# Patient Record
Sex: Female | Born: 1940 | Race: White | Hispanic: No | Marital: Married | Smoking: Former smoker
Health system: Southern US, Community
[De-identification: ages and names within clinical notes are randomized; demographics above are authoritative.]

## PROBLEM LIST (undated history)

## (undated) DIAGNOSIS — M8580 Other specified disorders of bone density and structure, unspecified site: Secondary | ICD-10-CM

## (undated) DIAGNOSIS — Z8669 Personal history of other diseases of the nervous system and sense organs: Secondary | ICD-10-CM

## (undated) DIAGNOSIS — J302 Other seasonal allergic rhinitis: Secondary | ICD-10-CM

## (undated) DIAGNOSIS — M1990 Unspecified osteoarthritis, unspecified site: Secondary | ICD-10-CM

## (undated) DIAGNOSIS — I10 Essential (primary) hypertension: Secondary | ICD-10-CM

## (undated) DIAGNOSIS — E669 Obesity, unspecified: Secondary | ICD-10-CM

## (undated) DIAGNOSIS — R531 Weakness: Secondary | ICD-10-CM

## (undated) DIAGNOSIS — R202 Paresthesia of skin: Secondary | ICD-10-CM

## (undated) DIAGNOSIS — G5600 Carpal tunnel syndrome, unspecified upper limb: Secondary | ICD-10-CM

## (undated) DIAGNOSIS — K5790 Diverticulosis of intestine, part unspecified, without perforation or abscess without bleeding: Secondary | ICD-10-CM

## (undated) DIAGNOSIS — Z9109 Other allergy status, other than to drugs and biological substances: Secondary | ICD-10-CM

## (undated) DIAGNOSIS — K219 Gastro-esophageal reflux disease without esophagitis: Secondary | ICD-10-CM

## (undated) DIAGNOSIS — R269 Unspecified abnormalities of gait and mobility: Secondary | ICD-10-CM

## (undated) DIAGNOSIS — R4781 Slurred speech: Secondary | ICD-10-CM

## (undated) DIAGNOSIS — R200 Anesthesia of skin: Secondary | ICD-10-CM

## (undated) DIAGNOSIS — G4700 Insomnia, unspecified: Secondary | ICD-10-CM

## (undated) DIAGNOSIS — I6350 Cerebral infarction due to unspecified occlusion or stenosis of unspecified cerebral artery: Secondary | ICD-10-CM

## (undated) DIAGNOSIS — R0602 Shortness of breath: Secondary | ICD-10-CM

## (undated) DIAGNOSIS — I639 Cerebral infarction, unspecified: Secondary | ICD-10-CM

## (undated) DIAGNOSIS — I739 Peripheral vascular disease, unspecified: Secondary | ICD-10-CM

## (undated) HISTORY — DX: Other specified disorders of bone density and structure, unspecified site: M85.80

## (undated) HISTORY — DX: Cerebral infarction, unspecified: I63.9

## (undated) HISTORY — DX: Gastro-esophageal reflux disease without esophagitis: K21.9

## (undated) HISTORY — DX: Anesthesia of skin: R20.0

## (undated) HISTORY — DX: Shortness of breath: R06.02

## (undated) HISTORY — DX: Cerebral infarction due to unspecified occlusion or stenosis of unspecified cerebral artery: I63.50

## (undated) HISTORY — DX: Carpal tunnel syndrome, unspecified upper limb: G56.00

## (undated) HISTORY — DX: Paresthesia of skin: R20.2

## (undated) HISTORY — DX: Personal history of other diseases of the nervous system and sense organs: Z86.69

## (undated) HISTORY — DX: Unspecified osteoarthritis, unspecified site: M19.90

## (undated) HISTORY — DX: Essential (primary) hypertension: I10

## (undated) HISTORY — PX: ABDOMINAL HYSTERECTOMY: SHX81

## (undated) HISTORY — DX: Obesity, unspecified: E66.9

## (undated) HISTORY — DX: Diverticulosis of intestine, part unspecified, without perforation or abscess without bleeding: K57.90

## (undated) HISTORY — DX: Insomnia, unspecified: G47.00

## (undated) HISTORY — DX: Other seasonal allergic rhinitis: J30.2

## (undated) HISTORY — DX: Other allergy status, other than to drugs and biological substances: Z91.09

## (undated) HISTORY — DX: Slurred speech: R47.81

## (undated) HISTORY — DX: Peripheral vascular disease, unspecified: I73.9

## (undated) HISTORY — DX: Unspecified abnormalities of gait and mobility: R26.9

## (undated) HISTORY — PX: EYE SURGERY: SHX253

---

## 1998-04-26 ENCOUNTER — Encounter: Payer: Self-pay | Admitting: Otolaryngology

## 1998-04-26 ENCOUNTER — Ambulatory Visit (HOSPITAL_COMMUNITY): Admission: RE | Admit: 1998-04-26 | Discharge: 1998-04-26 | Payer: Self-pay | Admitting: Otolaryngology

## 1999-12-08 ENCOUNTER — Ambulatory Visit (HOSPITAL_COMMUNITY): Admission: RE | Admit: 1999-12-08 | Discharge: 1999-12-08 | Payer: Self-pay | Admitting: Family Medicine

## 1999-12-08 ENCOUNTER — Encounter: Payer: Self-pay | Admitting: Family Medicine

## 2000-02-09 ENCOUNTER — Ambulatory Visit: Admission: RE | Admit: 2000-02-09 | Discharge: 2000-02-09 | Payer: Self-pay | Admitting: *Deleted

## 2000-09-07 ENCOUNTER — Emergency Department (HOSPITAL_COMMUNITY): Admission: EM | Admit: 2000-09-07 | Discharge: 2000-09-07 | Payer: Self-pay | Admitting: Emergency Medicine

## 2000-09-07 ENCOUNTER — Encounter: Payer: Self-pay | Admitting: Emergency Medicine

## 2003-01-21 ENCOUNTER — Ambulatory Visit (HOSPITAL_COMMUNITY): Admission: RE | Admit: 2003-01-21 | Discharge: 2003-01-21 | Payer: Self-pay | Admitting: Gastroenterology

## 2003-11-05 ENCOUNTER — Encounter: Admission: RE | Admit: 2003-11-05 | Discharge: 2003-11-20 | Payer: Self-pay | Admitting: Family Medicine

## 2004-04-05 ENCOUNTER — Ambulatory Visit (HOSPITAL_COMMUNITY): Admission: RE | Admit: 2004-04-05 | Discharge: 2004-04-05 | Payer: Self-pay | Admitting: Family Medicine

## 2005-02-28 ENCOUNTER — Encounter: Admission: RE | Admit: 2005-02-28 | Discharge: 2005-02-28 | Payer: Self-pay | Admitting: Family Medicine

## 2005-11-09 ENCOUNTER — Other Ambulatory Visit: Admission: RE | Admit: 2005-11-09 | Discharge: 2005-11-09 | Payer: Self-pay | Admitting: Obstetrics and Gynecology

## 2005-11-13 ENCOUNTER — Ambulatory Visit (HOSPITAL_COMMUNITY): Admission: RE | Admit: 2005-11-13 | Discharge: 2005-11-13 | Payer: Self-pay | Admitting: Obstetrics and Gynecology

## 2007-01-04 ENCOUNTER — Ambulatory Visit (HOSPITAL_COMMUNITY): Admission: RE | Admit: 2007-01-04 | Discharge: 2007-01-04 | Payer: Self-pay | Admitting: Obstetrics and Gynecology

## 2007-05-07 ENCOUNTER — Encounter: Admission: RE | Admit: 2007-05-07 | Discharge: 2007-05-07 | Payer: Self-pay | Admitting: Family Medicine

## 2007-11-18 ENCOUNTER — Other Ambulatory Visit: Admission: RE | Admit: 2007-11-18 | Discharge: 2007-11-18 | Payer: Self-pay | Admitting: Obstetrics and Gynecology

## 2008-03-10 ENCOUNTER — Ambulatory Visit (HOSPITAL_COMMUNITY): Admission: RE | Admit: 2008-03-10 | Discharge: 2008-03-10 | Payer: Self-pay | Admitting: Obstetrics and Gynecology

## 2009-01-27 ENCOUNTER — Encounter: Admission: RE | Admit: 2009-01-27 | Discharge: 2009-01-27 | Payer: Self-pay | Admitting: Obstetrics and Gynecology

## 2009-11-09 ENCOUNTER — Encounter: Admission: RE | Admit: 2009-11-09 | Discharge: 2009-11-09 | Payer: Self-pay | Admitting: Obstetrics and Gynecology

## 2010-10-14 NOTE — Op Note (Signed)
   NAME:  Kristen Barry, Kristen Barry                         ACCOUNT NO.:  [id number]   MEDICAL RECORD NO.:  [id number]                   PATIENT TYPE:  AMB   LOCATION:  ENDO                                 FACILITY:  [facility]   PHYSICIAN:  Graylin Shiver, M.D.                DATE OF BIRTH:  [birth year]   DATE OF PROCEDURE:  01/21/2003  DATE OF DISCHARGE:                                 OPERATIVE REPORT   PROCEDURE:  Esophagogastroduodenoscopy.   INDICATION FOR PROCEDURE:  Epigastric discomfort.   Informed consent was obtained after explanation of the risks of bleeding,  infection, and perforation.   PREMEDICATIONS:  1. Fentanyl 50 mcg IV.  2. Versed 5 mg IV.   DESCRIPTION OF PROCEDURE:  With the patient in the left lateral decubitus  position, the Olympus gastroscope was inserted into the oropharynx and  passed into the esophagus.  It was advanced down the esophagus, then into  the stomach, and into the duodenum.  The second portion and bulb of the  duodenum were normal.  The stomach looked normal in its entirety.  No  abnormalities were seen in the fundus or cardia on retroflexion.  The  esophagus looked normal in its entirety.  The esophagogastric junction was  at 38 cm from the incisor teeth.  She tolerated the procedure well without  complications.   IMPRESSION:  Normal esophagogastroduodenoscopy.   I see nothing on this examination to explain epigastric discomfort which she  has intermittently.  There is no evidence of mucosal damage or obvious  inflammation.                                               Graylin Shiver, M.D.    Germain Osgood  D:  01/21/2003  T:  01/21/2003  Job:  [id number]   cc:   Joycelyn Rua, M.D.  [address] [place]  [place] [address]  Fax: [phone]

## 2011-01-25 ENCOUNTER — Other Ambulatory Visit: Payer: Self-pay | Admitting: Obstetrics and Gynecology

## 2011-01-25 DIAGNOSIS — Z1231 Encounter for screening mammogram for malignant neoplasm of breast: Secondary | ICD-10-CM

## 2011-02-06 ENCOUNTER — Ambulatory Visit
Admission: RE | Admit: 2011-02-06 | Discharge: 2011-02-06 | Disposition: A | Discharge status: Home / Self Care | Payer: Medicare Other | Source: Ambulatory Visit | Attending: Obstetrics and Gynecology | Admitting: Obstetrics and Gynecology

## 2011-02-06 DIAGNOSIS — Z1231 Encounter for screening mammogram for malignant neoplasm of breast: Secondary | ICD-10-CM

## 2011-02-09 ENCOUNTER — Other Ambulatory Visit: Payer: Self-pay | Admitting: Obstetrics and Gynecology

## 2011-02-09 DIAGNOSIS — R928 Other abnormal and inconclusive findings on diagnostic imaging of breast: Secondary | ICD-10-CM

## 2011-02-17 ENCOUNTER — Ambulatory Visit
Admission: RE | Admit: 2011-02-17 | Discharge: 2011-02-17 | Disposition: A | Discharge status: Home / Self Care | Payer: Medicare Other | Source: Ambulatory Visit | Attending: Obstetrics and Gynecology | Admitting: Obstetrics and Gynecology

## 2011-02-17 ENCOUNTER — Other Ambulatory Visit: Payer: Medicare Other

## 2011-02-17 DIAGNOSIS — R928 Other abnormal and inconclusive findings on diagnostic imaging of breast: Secondary | ICD-10-CM

## 2011-04-13 ENCOUNTER — Ambulatory Visit: Payer: Medicare Other | Attending: Family Medicine | Admitting: Physical Therapy

## 2011-04-13 DIAGNOSIS — M2560 Stiffness of unspecified joint, not elsewhere classified: Secondary | ICD-10-CM | POA: Insufficient documentation

## 2011-04-13 DIAGNOSIS — M2550 Pain in unspecified joint: Secondary | ICD-10-CM | POA: Insufficient documentation

## 2011-04-13 DIAGNOSIS — M25549 Pain in joints of unspecified hand: Secondary | ICD-10-CM | POA: Insufficient documentation

## 2011-04-13 DIAGNOSIS — IMO0001 Reserved for inherently not codable concepts without codable children: Secondary | ICD-10-CM | POA: Insufficient documentation

## 2011-04-18 ENCOUNTER — Ambulatory Visit: Payer: Medicare Other | Admitting: Physical Therapy

## 2011-04-24 ENCOUNTER — Ambulatory Visit: Payer: Medicare Other | Admitting: Physical Therapy

## 2011-04-26 ENCOUNTER — Ambulatory Visit: Payer: Medicare Other | Admitting: Physical Therapy

## 2011-05-02 ENCOUNTER — Encounter: Payer: Medicare Other | Admitting: Physical Therapy

## 2011-05-08 ENCOUNTER — Ambulatory Visit: Payer: Medicare Other | Attending: Family Medicine | Admitting: Physical Therapy

## 2011-05-08 DIAGNOSIS — M25549 Pain in joints of unspecified hand: Secondary | ICD-10-CM | POA: Insufficient documentation

## 2011-05-08 DIAGNOSIS — M2550 Pain in unspecified joint: Secondary | ICD-10-CM | POA: Insufficient documentation

## 2011-05-08 DIAGNOSIS — M2560 Stiffness of unspecified joint, not elsewhere classified: Secondary | ICD-10-CM | POA: Insufficient documentation

## 2011-05-08 DIAGNOSIS — IMO0001 Reserved for inherently not codable concepts without codable children: Secondary | ICD-10-CM | POA: Insufficient documentation

## 2012-01-08 ENCOUNTER — Other Ambulatory Visit: Payer: Self-pay | Admitting: Obstetrics and Gynecology

## 2012-01-08 DIAGNOSIS — Z1231 Encounter for screening mammogram for malignant neoplasm of breast: Secondary | ICD-10-CM

## 2012-02-19 ENCOUNTER — Ambulatory Visit
Admission: RE | Admit: 2012-02-19 | Discharge: 2012-02-19 | Disposition: A | Discharge status: Home / Self Care | Payer: Medicare Other | Source: Ambulatory Visit | Attending: Obstetrics and Gynecology | Admitting: Obstetrics and Gynecology

## 2012-02-19 DIAGNOSIS — Z1231 Encounter for screening mammogram for malignant neoplasm of breast: Secondary | ICD-10-CM

## 2013-03-17 ENCOUNTER — Other Ambulatory Visit: Payer: Self-pay

## 2013-03-17 DIAGNOSIS — Z1231 Encounter for screening mammogram for malignant neoplasm of breast: Secondary | ICD-10-CM

## 2013-04-08 ENCOUNTER — Ambulatory Visit: Payer: Medicare Other

## 2013-05-08 ENCOUNTER — Ambulatory Visit: Payer: Medicare Other

## 2013-06-30 ENCOUNTER — Ambulatory Visit (HOSPITAL_COMMUNITY): Payer: Medicare Other | Attending: Family Medicine

## 2013-06-30 ENCOUNTER — Other Ambulatory Visit (HOSPITAL_COMMUNITY): Payer: Self-pay | Admitting: Cardiology

## 2013-06-30 DIAGNOSIS — Z87891 Personal history of nicotine dependence: Secondary | ICD-10-CM | POA: Insufficient documentation

## 2013-06-30 DIAGNOSIS — I739 Peripheral vascular disease, unspecified: Secondary | ICD-10-CM

## 2013-06-30 DIAGNOSIS — I70219 Atherosclerosis of native arteries of extremities with intermittent claudication, unspecified extremity: Secondary | ICD-10-CM

## 2013-06-30 DIAGNOSIS — R209 Unspecified disturbances of skin sensation: Secondary | ICD-10-CM | POA: Insufficient documentation

## 2013-06-30 DIAGNOSIS — I10 Essential (primary) hypertension: Secondary | ICD-10-CM | POA: Insufficient documentation

## 2013-07-09 ENCOUNTER — Encounter: Payer: Self-pay | Admitting: *Deleted

## 2013-07-10 ENCOUNTER — Encounter: Payer: Self-pay | Admitting: Cardiology

## 2013-07-10 ENCOUNTER — Ambulatory Visit (INDEPENDENT_AMBULATORY_CARE_PROVIDER_SITE_OTHER): Payer: Medicare Other | Admitting: Cardiology

## 2013-07-10 ENCOUNTER — Ambulatory Visit: Payer: Medicare Other

## 2013-07-10 VITALS — BP 160/80 | HR 74 | Ht 62.0 in | Wt 200.0 lb

## 2013-07-10 DIAGNOSIS — I10 Essential (primary) hypertension: Secondary | ICD-10-CM | POA: Insufficient documentation

## 2013-07-10 DIAGNOSIS — M5430 Sciatica, unspecified side: Secondary | ICD-10-CM

## 2013-07-10 DIAGNOSIS — Z0000 Encounter for general adult medical examination without abnormal findings: Secondary | ICD-10-CM

## 2013-07-10 NOTE — Addendum Note (Signed)
Addended by: Williemae AreaANSOA, Vena Bassinger on: 07/10/2013 09:07 AM   Modules accepted: Orders

## 2013-07-10 NOTE — Patient Instructions (Signed)
**Note De-Identified Reubin Bushnell Obfuscation** Your physician recommends that you continue on your current medications as directed. Please refer to the Current Medication list given to you today.  Your physician is referring you to Orthopedics for sciatica  Your physician recommends that you schedule a follow-up appointment in: as needed

## 2013-07-10 NOTE — Progress Notes (Signed)
Patient ID: Kristen Barry, female   DOB: 1940-11-30, 73 y.o.   MRN: [id number]     Patient Name: Kristen Barry Date of Encounter: 07/10/2013  Primary Care Provider:  Lenora Boys, MD Primary Cardiologist:  Tobias Alexander, H  Problem List   Past Medical History  Diagnosis Date  . Hypertension   . OA (osteoarthritis)   . Osteopenia   . Laryngopharyngeal reflux   . Diverticulosis   . Seasonal allergies   . H/O Bell's palsy   . CTS (carpal tunnel syndrome)    No past surgical history on file.  Allergies  Allergies  Allergen Reactions  . Ace Inhibitors     Cough   . Codeine     Stomach upset     HPI  A 73 year old female with prior medical history of hypertension and vertigo who is coming with complain of right lower extremity pain. The patient has developed this pain approximately 4 months ago. It is a throbbing, burning, cramping-like, sharp pain radiating from her buttocks down the backside of her right lower extremity and continues to her toes.. It is sometimes associated with numbness and feeling of her "leg going to sleep". This is not related exclusively to physical activity, it can happen while walking but mostly while lying in bed or sitting. She is very active, taking care of her granddaughter, and she denies any chest pain, shortness of breath, palpitations or syncope. She states that she checks her blood pressure at home and states that systolic BP is always in the range of 125 to 130 mmHg.  Home Medications  Prior to Admission medications   Medication Sig Start Date End Date Taking? Authorizing Provider  amLODipine (NORVASC) 10 MG tablet Take 10 mg by mouth daily.   Yes Historical Provider, MD  aspirin 81 MG tablet Take 81 mg by mouth daily.   Yes Historical Provider, MD  Calcium Carbonate-Vit D-Min (CALCIUM 1200 PO) Take by mouth daily.   Yes Historical Provider, MD  celecoxib (CELEBREX) 200 MG capsule Take 200 mg by mouth daily.   Yes Historical  Provider, MD  cholecalciferol (VITAMIN D) 1000 UNITS tablet Take 1,000 Units by mouth daily.   Yes Historical Provider, MD  metoprolol (TOPROL-XL) 200 MG 24 hr tablet Take 100 mg by mouth daily.   Yes Historical Provider, MD  Omega-3 Fatty Acids (FISH OIL) 1000 MG CAPS Take by mouth daily.   Yes Historical Provider, MD  omeprazole (PRILOSEC) 40 MG capsule Take 40 mg by mouth daily.   Yes Historical Provider, MD  vitamin C (ASCORBIC ACID) 500 MG tablet Take 500 mg by mouth daily.   Yes Historical Provider, MD   Family History  Family History  Problem Relation Age of Onset  . CAD Father   . CVA Mother   . Diabetes Mother   . Osteoarthritis Mother   . Hypertension Mother   . Heart Problems Brother     stent  . Heart attack Brother     2-3 caths  . Melanoma Paternal Aunt    Social History  History   Social History  . Marital Status: Married    Spouse Name: N/A    Number of Children: 3  . Years of Education: N/A   Occupational History  . Not on file.   Social History Main Topics  . Smoking status: Former Games developer  . Smokeless tobacco: Not on file  . Alcohol Use: Yes  . Drug Use: No  . Sexual Activity: Not  on file   Other Topics Concern  . Not on file   Social History Narrative  . No narrative on file    Review of Systems, as per HPI, otherwise negative General:  No chills, fever, night sweats or weight changes.  Cardiovascular:  No chest pain, dyspnea on exertion, edema, orthopnea, palpitations, paroxysmal nocturnal dyspnea. Dermatological: No rash, lesions/masses Respiratory: No cough, dyspnea Urologic: No hematuria, dysuria Abdominal:   No nausea, vomiting, diarrhea, bright red blood per rectum, melena, or hematemesis Neurologic:  No visual changes, wkns, changes in mental status. All other systems reviewed and are otherwise negative except as noted above.  Physical Exam  Blood pressure 160/80, pulse 74, height 5\' 2"  (1.575 m), weight 200 lb (90.719 kg).    General: Pleasant, NAD Psych: Normal affect. Neuro: Alert and oriented X 3. Moves all extremities spontaneously. HEENT: Normal  Neck: Supple without bruits or JVD. Lungs:  Resp regular and unlabored, CTA. Heart: RRR no s3, s4, 2/6 systolic murmur Abdomen: Soft, non-tender, non-distended, BS + x 4.  Extremities: No clubbing, cyanosis or edema. DP/PT/Radials 2+ and equal bilaterally.  Labs:  None  Accessory Clinical Findings  Echocardiogram -none  ECG - ST, iRBBB, otherwise normal    Assessment & Plan  A 73 year old female who is coming with complaints of sciatica type of pain of right lower extremity. The patient denies claudication type of pain and in fact she has very good pulses in her lower extremities. Patient's ABIs were both normal with normal waveforms excluding any segmental stenosis in the arteries of her lower extremities. The patient will be referred to orthopedic surgeon for further evaluation.. This is most probably sciatica type of pain coming from the area of L5/S1. Patient's blood pressure is uncontrolled today however she states it's related to stress on and it's always controlled at home. Her EKG doesn't suggest any LVH we'll continue the same medication at this point.  Patient has a systolic murmur that is most probably of mitral regurgitation. Patient states that this has been known for years she is asymptomatic from any shortness of breath. At this point patient doesn't need to return for clinic unless her any new concerns.   Tobias AlexanderNELSON, Wilmer Berryhill, Rexene EdisonH, MD, Western Maryland CenterFACC 07/10/2013, 8:30 AM

## 2013-07-15 ENCOUNTER — Ambulatory Visit: Payer: Medicare Other

## 2013-07-19 ENCOUNTER — Encounter (HOSPITAL_BASED_OUTPATIENT_CLINIC_OR_DEPARTMENT_OTHER): Payer: Self-pay | Admitting: Emergency Medicine

## 2013-07-19 ENCOUNTER — Emergency Department (HOSPITAL_BASED_OUTPATIENT_CLINIC_OR_DEPARTMENT_OTHER)
Admission: EM | Admit: 2013-07-19 | Discharge: 2013-07-19 | Disposition: A | Discharge status: Home / Self Care | Payer: Medicare Other | Attending: Emergency Medicine | Admitting: Emergency Medicine

## 2013-07-19 ENCOUNTER — Emergency Department (HOSPITAL_BASED_OUTPATIENT_CLINIC_OR_DEPARTMENT_OTHER): Payer: Medicare Other

## 2013-07-19 DIAGNOSIS — Z87891 Personal history of nicotine dependence: Secondary | ICD-10-CM | POA: Insufficient documentation

## 2013-07-19 DIAGNOSIS — I10 Essential (primary) hypertension: Secondary | ICD-10-CM | POA: Insufficient documentation

## 2013-07-19 DIAGNOSIS — M899 Disorder of bone, unspecified: Secondary | ICD-10-CM | POA: Insufficient documentation

## 2013-07-19 DIAGNOSIS — M949 Disorder of cartilage, unspecified: Secondary | ICD-10-CM

## 2013-07-19 DIAGNOSIS — Z7982 Long term (current) use of aspirin: Secondary | ICD-10-CM | POA: Insufficient documentation

## 2013-07-19 DIAGNOSIS — Z8709 Personal history of other diseases of the respiratory system: Secondary | ICD-10-CM | POA: Insufficient documentation

## 2013-07-19 DIAGNOSIS — M1990 Unspecified osteoarthritis, unspecified site: Secondary | ICD-10-CM | POA: Insufficient documentation

## 2013-07-19 DIAGNOSIS — Z8669 Personal history of other diseases of the nervous system and sense organs: Secondary | ICD-10-CM | POA: Insufficient documentation

## 2013-07-19 DIAGNOSIS — S59919A Unspecified injury of unspecified forearm, initial encounter: Principal | ICD-10-CM

## 2013-07-19 DIAGNOSIS — Z79899 Other long term (current) drug therapy: Secondary | ICD-10-CM | POA: Insufficient documentation

## 2013-07-19 DIAGNOSIS — Y9301 Activity, walking, marching and hiking: Secondary | ICD-10-CM | POA: Insufficient documentation

## 2013-07-19 DIAGNOSIS — Z8719 Personal history of other diseases of the digestive system: Secondary | ICD-10-CM | POA: Insufficient documentation

## 2013-07-19 DIAGNOSIS — Y92009 Unspecified place in unspecified non-institutional (private) residence as the place of occurrence of the external cause: Secondary | ICD-10-CM | POA: Insufficient documentation

## 2013-07-19 DIAGNOSIS — S59909A Unspecified injury of unspecified elbow, initial encounter: Secondary | ICD-10-CM | POA: Insufficient documentation

## 2013-07-19 DIAGNOSIS — S6991XA Unspecified injury of right wrist, hand and finger(s), initial encounter: Secondary | ICD-10-CM

## 2013-07-19 DIAGNOSIS — W010XXA Fall on same level from slipping, tripping and stumbling without subsequent striking against object, initial encounter: Secondary | ICD-10-CM | POA: Insufficient documentation

## 2013-07-19 DIAGNOSIS — S6990XA Unspecified injury of unspecified wrist, hand and finger(s), initial encounter: Principal | ICD-10-CM | POA: Insufficient documentation

## 2013-07-19 DIAGNOSIS — Z791 Long term (current) use of non-steroidal anti-inflammatories (NSAID): Secondary | ICD-10-CM | POA: Insufficient documentation

## 2013-07-19 MED ORDER — TRAMADOL HCL 50 MG PO TABS
50.0000 mg | ORAL_TABLET | Freq: Once | ORAL | Status: AC
  Administered 2013-07-19: 50 mg via ORAL
  Filled 2013-07-19: qty 1

## 2013-07-19 MED ORDER — TRAMADOL HCL 50 MG PO TABS: 50.0000 mg | ORAL_TABLET | Freq: Four times a day (QID) | ORAL | Status: DC | PRN

## 2013-07-19 NOTE — Discharge Instructions (Signed)
You suffered injury to your right wrist. Although x-ray did not show any obvious bony fracture I suspect you may have a broken wrist bone. Wear splint for support, follow instructions below, followup with hand specialist for reexamination.  Take medication with food to decrease stomach upset.  Elastic Bandage and RICE Elastic bandages come in different shapes and sizes. They perform different functions. Your caregiver will help you to decide what is best for your protection, recovery, or rehabilitation following an injury. The following are some general tips to help you use an elastic bandage.  Use the bandage as directed by the maker of the bandage you are using.  Do not wrap it too tight. This may cut off the circulation of the arm or leg below the bandage.  If part of your body beyond the bandage becomes blue, numb, or swollen, it is too tight. Loosen the bandage as needed to prevent these problems.  See your caregiver or trainer if the bandage seems to be making your problems worse rather than better. Bandages may be a reminder to you that you have an injury. However, they provide very little support. The few pounds of support they provide are minor considering the pressure it takes to injure a joint or tear ligaments. Therefore, the joint will not be able to handle all of the wear and tear it could before the injury. The routine care of many injuries includes Rest, Ice, Compression, and Elevation (RICE).  Rest is required to allow your body to heal. Generally, routine activities can be resumed when comfortable. Injured tendons and bones take about 6 weeks to heal.  Icing the injury helps keep the swelling down and reduces pain. Do not apply ice directly to the skin. Put ice in a plastic bag. Place a towel between the skin and the bag. This will prevent frostbite to the skin. Apply ice bags to the injured area for 15-20 minutes, every 2 hours while awake. Do this for the first 24 to 48 hours,  then as directed by your caregiver.  Compression helps keep swelling down, gives support, and helps with discomfort. If an elastic bandage has been applied today, it should be removed and reapplied every 3 to 4 hours. It should not be applied tightly, but firmly enough to keep swelling down. Watch fingers or toes for swelling, bluish discoloration, coldness, numbness, or increased pain. If any of these problems occur, remove the bandage and reapply it more loosely. If these problems persist, contact your caregiver.  Elevation helps reduce swelling and decreases pain. The injured area (arms, hands, legs, or feet) should be placed near to or above the heart (center of the chest) if able. Persistent pain and inability to use the injured area for more than 2 to 3 days are warning signs. You should see a caregiver for a follow-up visit as soon as possible. Initially, a minor broken bone (hairline fracture) may not be seen on X-rays. It may take 7 to 10 days to finally show up. Continued pain and swelling show that further evaluation and/or X-rays are needed. Make a follow-up visit with your caregiver. A specialist in reading X-rays (radiologist) will read your X-rays again. Finding out the results of your test Not all test results are available during your visit. If your test results are not back during the visit, make an appointment with your caregiver to find out the results. Do not assume everything is normal if you have not heard from your caregiver or the medical  facility. It is important for you to follow up on all of your test results. Document Released: 11/04/2001 Document Revised: 08/07/2011 Document Reviewed: 09/16/2007 [facility] Patient Information 2014 [employer], [place].

## 2013-07-19 NOTE — ED Provider Notes (Signed)
CSN: [id number]     Arrival date & time 07/19/13  1901 History   First MD Initiated Contact with Patient 07/19/13 2025     Chief Complaint  Patient presents with  . Fall     (Consider location/radiation/quality/duration/timing/severity/associated sxs/prior Treatment) HPI  73 year old female with history of osteopenia, history of osteoarthritis who presents for evaluation of recent fall. Patient states she was walking outside her house, slipped on ice fell and landed backward. She extended her right arm to break the fall and complaining of acute onset of sharp pain to the right wrist with swelling. Increasing pain with wrist movement. She also hit her bottom which hurt initially but has improved. She denies hitting her head or loss of consciousness. She denies any other injury, specifically no elbow or shoulder pain. She denies any numbness. No specific treatment tried. Incident happened about 3 hours ago. Denies any precipitating symptoms prior to fall.  Past Medical History  Diagnosis Date  . Hypertension   . OA (osteoarthritis)   . Osteopenia   . Laryngopharyngeal reflux   . Diverticulosis   . Seasonal allergies   . H/O Bell's palsy   . CTS (carpal tunnel syndrome)    History reviewed. No pertinent past surgical history. Family History  Problem Relation Age of Onset  . CAD Father   . CVA Mother   . Diabetes Mother   . Osteoarthritis Mother   . Hypertension Mother   . Heart Problems Brother     stent  . Heart attack Brother     2-3 caths  . Melanoma Paternal Aunt    History  Substance Use Topics  . Smoking status: Former Games developer  . Smokeless tobacco: Not on file  . Alcohol Use: Yes   OB History   Grav Para Term Preterm Abortions TAB SAB Ect Mult Living                 Review of Systems  Constitutional: Negative for fever.  Musculoskeletal: Positive for arthralgias and joint swelling.  Skin: Negative for rash and wound.  Neurological: Negative for headaches.       Allergies  Ace inhibitors and Codeine  Home Medications   Current Outpatient Rx  Name  Route  Sig  Dispense  Refill  . amLODipine (NORVASC) 10 MG tablet   Oral   Take 10 mg by mouth daily.         [name] aspirin 81 MG tablet   Oral   Take 81 mg by mouth daily.         . Calcium Carbonate-Vit D-Min (CALCIUM 1200 PO)   Oral   Take by mouth daily.         . celecoxib (CELEBREX) 200 MG capsule   Oral   Take 200 mg by mouth daily.         . cholecalciferol (VITAMIN D) 1000 UNITS tablet   Oral   Take 1,000 Units by mouth daily.         . metoprolol (TOPROL-XL) 200 MG 24 hr tablet   Oral   Take 100 mg by mouth daily.         . Omega-3 Fatty Acids (FISH OIL) 1000 MG CAPS   Oral   Take by mouth daily.         [name] omeprazole (PRILOSEC) 40 MG capsule   Oral   Take 40 mg by mouth daily.         . vitamin C (ASCORBIC ACID) 500 MG tablet  Oral   Take 500 mg by mouth daily.          BP 164/71  Pulse 67  Temp(Src) 98.8 F (37.1 C) (Oral)  Resp 18  Ht 5\' 2"  (1.575 m)  Wt 199 lb (90.266 kg)  BMI 36.39 kg/m2  SpO2 98% Physical Exam  Nursing note and vitals reviewed. Constitutional: She appears well-developed and well-nourished. No distress.  HENT:  Head: Atraumatic.  Eyes: Conjunctivae are normal.  Neck: Neck supple.  Cardiovascular: Intact distal pulses.   Musculoskeletal: She exhibits tenderness (R wrist: moderate tenderness throughout wrist but most specifically to dorsum of wrist.  tenderness to anatomical snuff box.  decreased wrist flexion/extension/pronation/supination.  normal R hand/R elbow/R shoulder).  Intact sensation to right hand  Neurological: She is alert.  Skin: No rash noted.  Psychiatric: She has a normal mood and affect.    ED Course  Procedures (including critical care time)  9:14 PM Patient with right wrist injury from a mechanical fall. Although x-ray shows no definitive bony fracture I suspect patient may have occult  fracture since she has tenderness to the anatomical snuffbox.  Will provide thumb spica splint for support. Rice therapy discussed. Orthopedic referral as needed. Patient otherwise is neurovascularly intact.  Labs Review Labs Reviewed - No data to display Imaging Review Dg Wrist Complete Right  07/19/2013   CLINICAL DATA:  Pain and swelling right wrist, limited range of motion, post fall  EXAM: RIGHT WRIST - COMPLETE 3+ VIEW  COMPARISON:  Hand radiographs 04/10/2011  FINDINGS: Osseous demineralization.  Mild joint space narrowing at STT joint and first CMC joint.  Remaining joint spaces preserved.  No acute fracture, dislocation, or bone destruction.  Dorsal soft tissue swelling at wrist.  IMPRESSION: No definite acute osseous findings.  Dorsal soft tissue swelling.   Electronically Signed   By: Ulyses SouthwardMark  Boles M.D.   On: 07/19/2013 19:56    EKG Interpretation   None       MDM   Final diagnoses:  Right wrist injury    BP 164/71  Pulse 67  Temp(Src) 98.8 F (37.1 C) (Oral)  Resp 18  Ht 5\' 2"  (1.575 m)  Wt 199 lb (90.266 kg)  BMI 36.39 kg/m2  SpO2 98%  I have reviewed nursing notes and vital signs. I personally reviewed the imaging tests through PACS system  I reviewed available ER/hospitalization records thought the EMR     Fayrene HelperBowie Yasiel Goyne, [place] JerseyPA-C 07/19/13 2115

## 2013-07-19 NOTE — ED Notes (Signed)
C/o falling on ice.  Right wrist pain/swelling. Denies striking head.  Denies LOC.

## 2013-07-19 NOTE — ED Notes (Signed)
Pt ambulated to bathroom 

## 2013-07-19 NOTE — ED Provider Notes (Signed)
Medical screening examination/treatment/procedure(s) were performed by non-physician practitioner and as supervising physician I was immediately available for consultation/collaboration.  EKG Interpretation   None         Tate Jerkins, MD 07/19/13 2244 

## 2013-07-19 NOTE — ED Notes (Signed)
Pt states that she fell, landing on her right wrist. Pt denies hitting head or any other pain other than right wrist. Pt has swelling noted to her wrist, ice applied.

## 2013-07-29 ENCOUNTER — Ambulatory Visit: Payer: Medicare Other

## 2013-09-03 ENCOUNTER — Ambulatory Visit
Admission: RE | Admit: 2013-09-03 | Discharge: 2013-09-03 | Disposition: A | Discharge status: Home / Self Care | Payer: Medicare Other | Source: Ambulatory Visit

## 2013-09-03 DIAGNOSIS — Z1231 Encounter for screening mammogram for malignant neoplasm of breast: Secondary | ICD-10-CM

## 2014-04-03 ENCOUNTER — Other Ambulatory Visit: Payer: Self-pay | Admitting: Obstetrics and Gynecology

## 2014-04-03 DIAGNOSIS — N644 Mastodynia: Secondary | ICD-10-CM

## 2014-04-15 ENCOUNTER — Ambulatory Visit
Admission: RE | Admit: 2014-04-15 | Discharge: 2014-04-15 | Disposition: A | Discharge status: Home / Self Care | Payer: Medicare Other | Source: Ambulatory Visit | Attending: Obstetrics and Gynecology | Admitting: Obstetrics and Gynecology

## 2014-04-15 ENCOUNTER — Encounter (INDEPENDENT_AMBULATORY_CARE_PROVIDER_SITE_OTHER): Payer: Self-pay

## 2014-04-15 DIAGNOSIS — N644 Mastodynia: Secondary | ICD-10-CM

## 2014-08-19 ENCOUNTER — Other Ambulatory Visit: Payer: Self-pay

## 2014-08-19 DIAGNOSIS — Z1231 Encounter for screening mammogram for malignant neoplasm of breast: Secondary | ICD-10-CM

## 2014-09-09 ENCOUNTER — Ambulatory Visit
Admission: RE | Admit: 2014-09-09 | Discharge: 2014-09-09 | Disposition: A | Discharge status: Home / Self Care | Payer: Medicare Other | Source: Ambulatory Visit

## 2014-09-09 DIAGNOSIS — Z1231 Encounter for screening mammogram for malignant neoplasm of breast: Secondary | ICD-10-CM

## 2016-02-25 ENCOUNTER — Inpatient Hospital Stay (HOSPITAL_COMMUNITY)
Admission: EM | Admit: 2016-02-25 | Discharge: 2016-03-02 | DRG: 065 | Disposition: A | Discharge status: Skilled Nursing Facility | Payer: Medicare Other | Attending: Internal Medicine | Admitting: Internal Medicine

## 2016-02-25 ENCOUNTER — Observation Stay (HOSPITAL_COMMUNITY): Payer: Medicare Other

## 2016-02-25 ENCOUNTER — Encounter (HOSPITAL_COMMUNITY): Payer: Self-pay | Admitting: Emergency Medicine

## 2016-02-25 ENCOUNTER — Emergency Department (HOSPITAL_COMMUNITY): Payer: Medicare Other

## 2016-02-25 DIAGNOSIS — R29810 Facial weakness: Secondary | ICD-10-CM | POA: Diagnosis present

## 2016-02-25 DIAGNOSIS — G8191 Hemiplegia, unspecified affecting right dominant side: Secondary | ICD-10-CM | POA: Diagnosis present

## 2016-02-25 DIAGNOSIS — E785 Hyperlipidemia, unspecified: Secondary | ICD-10-CM | POA: Diagnosis present

## 2016-02-25 DIAGNOSIS — Z79899 Other long term (current) drug therapy: Secondary | ICD-10-CM

## 2016-02-25 DIAGNOSIS — I6350 Cerebral infarction due to unspecified occlusion or stenosis of unspecified cerebral artery: Secondary | ICD-10-CM | POA: Diagnosis not present

## 2016-02-25 DIAGNOSIS — E669 Obesity, unspecified: Secondary | ICD-10-CM | POA: Diagnosis present

## 2016-02-25 DIAGNOSIS — Z87891 Personal history of nicotine dependence: Secondary | ICD-10-CM

## 2016-02-25 DIAGNOSIS — Z6836 Body mass index (BMI) 36.0-36.9, adult: Secondary | ICD-10-CM | POA: Diagnosis not present

## 2016-02-25 DIAGNOSIS — K219 Gastro-esophageal reflux disease without esophagitis: Secondary | ICD-10-CM | POA: Diagnosis present

## 2016-02-25 DIAGNOSIS — I10 Essential (primary) hypertension: Secondary | ICD-10-CM | POA: Diagnosis present

## 2016-02-25 DIAGNOSIS — Z808 Family history of malignant neoplasm of other organs or systems: Secondary | ICD-10-CM

## 2016-02-25 DIAGNOSIS — G510 Bell's palsy: Secondary | ICD-10-CM | POA: Diagnosis present

## 2016-02-25 DIAGNOSIS — Z833 Family history of diabetes mellitus: Secondary | ICD-10-CM

## 2016-02-25 DIAGNOSIS — Z888 Allergy status to other drugs, medicaments and biological substances status: Secondary | ICD-10-CM

## 2016-02-25 DIAGNOSIS — Z823 Family history of stroke: Secondary | ICD-10-CM | POA: Diagnosis not present

## 2016-02-25 DIAGNOSIS — Z8249 Family history of ischemic heart disease and other diseases of the circulatory system: Secondary | ICD-10-CM

## 2016-02-25 DIAGNOSIS — R531 Weakness: Secondary | ICD-10-CM | POA: Diagnosis present

## 2016-02-25 DIAGNOSIS — M1990 Unspecified osteoarthritis, unspecified site: Secondary | ICD-10-CM | POA: Diagnosis present

## 2016-02-25 DIAGNOSIS — M6289 Other specified disorders of muscle: Secondary | ICD-10-CM | POA: Diagnosis not present

## 2016-02-25 DIAGNOSIS — E6609 Other obesity due to excess calories: Secondary | ICD-10-CM | POA: Diagnosis not present

## 2016-02-25 DIAGNOSIS — I6302 Cerebral infarction due to thrombosis of basilar artery: Secondary | ICD-10-CM

## 2016-02-25 DIAGNOSIS — I739 Peripheral vascular disease, unspecified: Secondary | ICD-10-CM | POA: Diagnosis present

## 2016-02-25 DIAGNOSIS — Z7982 Long term (current) use of aspirin: Secondary | ICD-10-CM

## 2016-02-25 DIAGNOSIS — M8580 Other specified disorders of bone density and structure, unspecified site: Secondary | ICD-10-CM | POA: Diagnosis present

## 2016-02-25 DIAGNOSIS — I639 Cerebral infarction, unspecified: Principal | ICD-10-CM | POA: Diagnosis present

## 2016-02-25 DIAGNOSIS — Z886 Allergy status to analgesic agent status: Secondary | ICD-10-CM | POA: Diagnosis not present

## 2016-02-25 DIAGNOSIS — R4781 Slurred speech: Secondary | ICD-10-CM | POA: Diagnosis present

## 2016-02-25 DIAGNOSIS — I6789 Other cerebrovascular disease: Secondary | ICD-10-CM | POA: Diagnosis not present

## 2016-02-25 DIAGNOSIS — E876 Hypokalemia: Secondary | ICD-10-CM | POA: Diagnosis present

## 2016-02-25 HISTORY — DX: Weakness: R53.1

## 2016-02-25 HISTORY — DX: Slurred speech: R47.81

## 2016-02-25 HISTORY — DX: Cerebral infarction, unspecified: I63.9

## 2016-02-25 HISTORY — DX: Obesity, unspecified: E66.9

## 2016-02-25 LAB — I-STAT CHEM 8, ED
BUN: 20 mg/dL (ref 6–20)
CHLORIDE: 103 mmol/L (ref 101–111)
Calcium, Ion: 1.17 mmol/L (ref 1.15–1.40)
Creatinine, Ser: 0.8 mg/dL (ref 0.44–1.00)
Glucose, Bld: 110 mg/dL — ABNORMAL HIGH (ref 65–99)
HEMATOCRIT: 46 % (ref 36.0–46.0)
Hemoglobin: 15.6 g/dL — ABNORMAL HIGH (ref 12.0–15.0)
Potassium: 4 mmol/L (ref 3.5–5.1)
Sodium: 140 mmol/L (ref 135–145)
TCO2: 25 mmol/L (ref 0–100)

## 2016-02-25 LAB — LIPID PANEL
Cholesterol: 213 mg/dL — ABNORMAL HIGH (ref 0–200)
HDL: 49 mg/dL (ref >40)
LDL Cholesterol: 137 mg/dL — ABNORMAL HIGH (ref 0–99)
Total CHOL/HDL Ratio: 4.3 RATIO
Triglycerides: 137 mg/dL (ref <150)
VLDL: 27 mg/dL (ref 0–40)

## 2016-02-25 LAB — CBC WITH DIFFERENTIAL/PLATELET
Basophils Absolute: 0 10*3/uL (ref 0.0–0.1)
Basophils Relative: 0 %
EOS PCT: 1 %
Eosinophils Absolute: 0 10*3/uL (ref 0.0–0.7)
HCT: 45.3 % (ref 36.0–46.0)
Hemoglobin: 15 g/dL (ref 12.0–15.0)
LYMPHS ABS: 1.3 10*3/uL (ref 0.7–4.0)
LYMPHS PCT: 17 %
MCH: 29.9 pg (ref 26.0–34.0)
MCHC: 33.1 g/dL (ref 30.0–36.0)
MCV: 90.2 fL (ref 78.0–100.0)
MONO ABS: 0.5 10*3/uL (ref 0.1–1.0)
MONOS PCT: 6 %
Neutro Abs: 5.9 10*3/uL (ref 1.7–7.7)
Neutrophils Relative %: 76 %
PLATELETS: 224 10*3/uL (ref 150–400)
RBC: 5.02 MIL/uL (ref 3.87–5.11)
RDW: 12.5 % (ref 11.5–15.5)
WBC: 7.8 10*3/uL (ref 4.0–10.5)

## 2016-02-25 LAB — COMPREHENSIVE METABOLIC PANEL
ALT: 17 U/L (ref 14–54)
ANION GAP: 9 (ref 5–15)
AST: 25 U/L (ref 15–41)
Albumin: 4.2 g/dL (ref 3.5–5.0)
Alkaline Phosphatase: 97 U/L (ref 38–126)
BUN: 16 mg/dL (ref 6–20)
CHLORIDE: 105 mmol/L (ref 101–111)
CO2: 23 mmol/L (ref 22–32)
Calcium: 9.8 mg/dL (ref 8.9–10.3)
Creatinine, Ser: 0.8 mg/dL (ref 0.44–1.00)
GFR calc Af Amer: >60 mL/min (ref >60)
GFR calc non Af Amer: >60 mL/min (ref >60)
GLUCOSE: 108 mg/dL — AB (ref 65–99)
POTASSIUM: 3.9 mmol/L (ref 3.5–5.1)
SODIUM: 137 mmol/L (ref 135–145)
Total Bilirubin: 0.6 mg/dL (ref 0.3–1.2)
Total Protein: 7.8 g/dL (ref 6.5–8.1)

## 2016-02-25 LAB — URINALYSIS, ROUTINE W REFLEX MICROSCOPIC
Bilirubin Urine: NEGATIVE
Glucose, UA: NEGATIVE mg/dL
Hgb urine dipstick: NEGATIVE
Ketones, ur: 15 mg/dL — AB
Leukocytes, UA: NEGATIVE
Nitrite: NEGATIVE
Protein, ur: NEGATIVE mg/dL
SPECIFIC GRAVITY, URINE: 1.018 (ref 1.005–1.030)
pH: 7 (ref 5.0–8.0)

## 2016-02-25 LAB — I-STAT TROPONIN, ED: Troponin i, poc: 0.01 ng/mL (ref 0.00–0.08)

## 2016-02-25 LAB — CBG MONITORING, ED: Glucose-Capillary: 115 mg/dL — ABNORMAL HIGH (ref 65–99)

## 2016-02-25 LAB — PROTIME-INR
INR: 0.96
PROTHROMBIN TIME: 12.8 s (ref 11.4–15.2)

## 2016-02-25 LAB — APTT: APTT: 27 s (ref 24–36)

## 2016-02-25 MED ORDER — STROKE: EARLY STAGES OF RECOVERY BOOK
Freq: Once | Status: DC
  Filled 2016-02-25: qty 1

## 2016-02-25 MED ORDER — ACETAMINOPHEN 325 MG PO TABS
650.0000 mg | ORAL_TABLET | ORAL | Status: DC | PRN
  Administered 2016-02-25 – 2016-03-01 (×9): 650 mg via ORAL
  Filled 2016-02-25 (×9): qty 2

## 2016-02-25 MED ORDER — SODIUM CHLORIDE 0.9 % IV SOLN
INTRAVENOUS | Status: AC
  Administered 2016-02-25: 16:00:00 via INTRAVENOUS

## 2016-02-25 MED ORDER — ONDANSETRON HCL 4 MG/2ML IJ SOLN: 4.0000 mg | Freq: Three times a day (TID) | INTRAMUSCULAR | Status: AC | PRN

## 2016-02-25 MED ORDER — SODIUM CHLORIDE 0.9 % IV SOLN
INTRAVENOUS | Status: DC
  Administered 2016-02-25 – 2016-02-26 (×3): via INTRAVENOUS

## 2016-02-25 MED ORDER — SENNOSIDES-DOCUSATE SODIUM 8.6-50 MG PO TABS
1.0000 | ORAL_TABLET | Freq: Every evening | ORAL | Status: DC | PRN
  Administered 2016-02-27: 1 via ORAL
  Filled 2016-02-25 (×2): qty 1

## 2016-02-25 MED ORDER — ENOXAPARIN SODIUM 40 MG/0.4ML ~~LOC~~ SOLN
40.0000 mg | SUBCUTANEOUS | Status: DC
  Administered 2016-02-26 – 2016-03-01 (×5): 40 mg via SUBCUTANEOUS
  Filled 2016-02-25 (×6): qty 0.4

## 2016-02-25 MED ORDER — ACETAMINOPHEN 650 MG RE SUPP: 650.0000 mg | RECTAL | Status: DC | PRN

## 2016-02-25 NOTE — ED Provider Notes (Signed)
[facility]-EMERGENCY DEPT Provider Note   CSN: [id number] Arrival date & time: 02/25/16  4098     History   Chief Complaint Chief Complaint  Patient presents with  . Cerebrovascular Accident    HPI Kristen HEGNER is a 75 y.o. female.  HPI Patient presents with strokelike symptoms which started yesterday around 7:00 AM.  She complains of weakness to her right arm and right leg as well as some slurred speech.  His history of high blood pressure and diabetes.  No previous history of stroke. Past Medical History:  Diagnosis Date  . CTS (carpal tunnel syndrome)   . Diverticulosis   . H/O Bell's palsy   . Hypertension   . Laryngopharyngeal reflux   . OA (osteoarthritis)   . Osteopenia   . Seasonal allergies     Patient Active Problem List   Diagnosis Date Noted  . Right sided weakness 02/25/2016  . Slurred speech 02/25/2016  . CVA (cerebral infarction) 02/25/2016  . Essential hypertension 07/10/2013    History reviewed. No pertinent surgical history.  OB History    No data available       Home Medications    Prior to Admission medications   Medication Sig Start Date End Date Taking? Authorizing Provider  amLODipine (NORVASC) 10 MG tablet Take 10 mg by mouth daily.   Yes Historical Provider, MD  aspirin 81 MG tablet Take 81 mg by mouth daily.   Yes Historical Provider, MD  Calcium Carbonate-Vit D-Min (CALCIUM 1200 PO) Take by mouth daily.   Yes Historical Provider, MD  celecoxib (CELEBREX) 200 MG capsule Take 200 mg by mouth daily.   Yes Historical Provider, MD  cholecalciferol (VITAMIN D) 1000 UNITS tablet Take 1,000 Units by mouth daily.   Yes Historical Provider, MD  metoprolol succinate (TOPROL-XL) 100 MG 24 hr tablet Take 100 mg by mouth daily. Take with or immediately following a meal.   Yes Historical Provider, MD  Omega-3 Fatty Acids (FISH OIL) 1000 MG CAPS Take by mouth daily.   Yes Historical Provider, MD  omeprazole (PRILOSEC) 40 MG capsule Take 40 mg by  mouth daily.   Yes Historical Provider, MD  traMADol (ULTRAM) 50 MG tablet Take 1 tablet (50 mg total) by mouth every 6 (six) hours as needed. 07/19/13  Yes Fayrene Helper, PA-C  vitamin C (ASCORBIC ACID) 500 MG tablet Take 500 mg by mouth daily.   Yes Historical Provider, MD  metoprolol (TOPROL-XL) 200 MG 24 hr tablet Take 100 mg by mouth daily.    Historical Provider, MD    Family History Family History  Problem Relation Age of Onset  . CVA Mother   . Diabetes Mother   . Osteoarthritis Mother   . Hypertension Mother   . CAD Father   . Heart Problems Brother     stent  . Heart attack Brother     2-3 caths  . Melanoma Paternal Aunt     Social History Social History  Substance Use Topics  . Smoking status: Former Games developer  . Smokeless tobacco: Never Used  . Alcohol use Yes     Allergies   Ace inhibitors and Codeine   Review of Systems Review of Systems All other systems reviewed and are negative  Physical Exam Updated Vital Signs BP 161/71 (BP Location: Left Arm)   Pulse 64   Temp 98.3 F (36.8 C) (Oral)   Resp 22   SpO2 97%   Physical Exam  Physical Exam  Nursing note and vitals  reviewed. Constitutional: She is oriented to person, place, and time. She appears well-developed and well-nourished. No distress.  HENT:  Head: Normocephalic and atraumatic.  Eyes: Pupils are equal, round, and reactive to light.  Neck: Normal range of motion.  Cardiovascular: Normal rate and intact distal pulses.   Pulmonary/Chest: No respiratory distress.  Abdominal: Normal appearance. She exhibits no distension.  Musculoskeletal: Normal range of motion.  Neurological: She is alert and oriented to person, place, and time. No cranial nerve deficit.  patient has weakness and drift in the right arm.  Also some weakness in the right leg.  Speech is slightly slurred.  No difficulty swallowing. Skin: Skin is warm and dry. No rash noted.  Psychiatric: She has a normal mood and affect. Her  behavior is normal.   ED Treatments / Results  Labs (all labs ordered are listed, but only abnormal results are displayed) Labs Reviewed  COMPREHENSIVE METABOLIC PANEL - Abnormal; Notable for the following:       Result Value   Glucose, Bld 108 (*)    All other components within normal limits  I-STAT CHEM 8, ED - Abnormal; Notable for the following:    Glucose, Bld 110 (*)    Hemoglobin 15.6 (*)    All other components within normal limits  CBG MONITORING, ED - Abnormal; Notable for the following:    Glucose-Capillary 115 (*)    All other components within normal limits  PROTIME-INR  APTT  CBC WITH DIFFERENTIAL/PLATELET  CBC  DIFFERENTIAL  URINALYSIS, ROUTINE W REFLEX MICROSCOPIC (NOT AT [facility])  I-STAT TROPOININ, ED    EKG  EKG Interpretation  Date/Time:  Friday February 25 2016 08:33:20 EDT Ventricular Rate:  70 PR Interval:    QRS Duration: 105 QT Interval:  408 QTC Calculation: 441 R Axis:   -3 Text Interpretation:  Sinus or ectopic atrial rhythm Consider left atrial enlargement Low voltage, precordial leads RSR' in V1 or V2, right VCD or RVH No previous tracing Confirmed by Radford PaxBEATON  MD, Bayler Nehring ([id number]) on 02/25/2016 8:36:24 AM       Radiology Ct Head Wo Contrast  Result Date: 02/25/2016 CLINICAL DATA:  Right-sided weakness since yesterday. EXAM: CT HEAD WITHOUT CONTRAST TECHNIQUE: Contiguous axial images were obtained from the base of the skull through the vertex without intravenous contrast. COMPARISON:  None. FINDINGS: Brain: Patchy hypodensities in the subcortical white matter. Focal low-density near the left external capsule and insular cortex suggests old disease. No evidence for acute hemorrhage, mass lesion, midline shift, hydrocephalus or large infarct. Vascular: No hyperdense vessel or unexpected calcification. Skull: No acute fracture. Sinuses/Orbits: Mild mucosal thickening along the inferior right maxillary sinus. Other: None. IMPRESSION: No acute intracranial  abnormality. Patchy disease in the white matter probably represents chronic small vessel ischemic changes. Electronically Signed   By: Richarda OverlieAdam  Henn M.D.   On: 02/25/2016 09:14    Procedures Procedures (including critical care time)  Medications Ordered in ED Medications - No data to display   Initial Impression / Assessment and Plan / ED Course  I have reviewed the triage vital signs and the nursing notes.  Pertinent labs & imaging results that were available during my care of the patient were reviewed by me and considered in my medical decision making (see chart for details).  Clinical Course   Discussed with neurology who will see the patient for evaluation.  Patient admitted to the hospitalist service.  Clinical exams consistent with recent CVA.   Final Clinical Impressions(s) / ED Diagnoses  Final diagnoses:  Acute CVA (cerebrovascular accident) [facility])    New Prescriptions New Prescriptions   No medications on file     Nelva Nay, MD 02/25/16 [phone]

## 2016-02-25 NOTE — ED Notes (Signed)
Pt taken to MRI  

## 2016-02-25 NOTE — H&P (Signed)
History and Physical    Kristen Oldenancy C Crevier ZOX:[id number] DOB: Dec 22, 1940 DOA: 02/25/2016  PCP: Lenora BoysFRIED, ROBERT L, MD Patient coming from: home  Chief Complaint: right sided weakness and slurred speech  HPI: Kristen Barry is a 75 y.o. female with medical history significant htn, Bell's palsy, presents to the emergency Department chief complaint right-sided weakness. Initial evaluation/exam concerning for CVA  Information is obtained from the patient and the family who is at the bedside. Patient reports "did not feel good yesterday". She states around 7 AM yesterday she developed some numbness tingling in her right foot somewhat weakness of her right arm and leg and slurred speech. She states that she felt like her speech was quite slurred and family reports she had trouble finding her words and that the words were "long". Associated symptoms include intermittent headache. She denies any visual disturbances or difficulty swallowing. She denies chest pain palpitations shortness of breath abdominal pain nausea vomiting diarrhea constipation. She denies lower extremity edema orthopnea. She denies dysuria hematuria frequency or urgency.    ED Course: In the emergency department she's afebrile hemodynamically stable and not hypoxic.  Review of Systems: As per HPI otherwise 10 point review of systems negative.   Ambulatory Status: Ambulates independently recent falls  Past Medical History:  Diagnosis Date  . CTS (carpal tunnel syndrome)   . Diverticulosis   . H/O Bell's palsy   . Hypertension   . Laryngopharyngeal reflux   . OA (osteoarthritis)   . Osteopenia   . Right sided weakness   . Seasonal allergies     History reviewed. No pertinent surgical history.  Social History   Social History  . Marital status: Married    Spouse name: N/A  . Number of children: 3  . Years of education: N/A   Occupational History  . Not on file.   Social History Main Topics  . Smoking status:  Former Games developermoker  . Smokeless tobacco: Never Used  . Alcohol use Yes  . Drug use: No  . Sexual activity: Not on file   Other Topics Concern  . Not on file   Social History Narrative  . No narrative on file    Allergies  Allergen Reactions  . Ace Inhibitors     Cough   . Codeine     Stomach upset     Family History  Problem Relation Age of Onset  . CVA Mother   . Diabetes Mother   . Osteoarthritis Mother   . Hypertension Mother   . CAD Father   . Heart Problems Brother     stent  . Heart attack Brother     2-3 caths  . Melanoma Paternal Aunt     Prior to Admission medications   Medication Sig Start Date End Date Taking? Authorizing Provider  amLODipine (NORVASC) 10 MG tablet Take 10 mg by mouth daily.   Yes Historical Provider, MD  aspirin 81 MG tablet Take 81 mg by mouth daily.   Yes Historical Provider, MD  Calcium Carbonate-Vit D-Min (CALCIUM 1200 PO) Take by mouth daily.   Yes Historical Provider, MD  celecoxib (CELEBREX) 200 MG capsule Take 200 mg by mouth daily.   Yes Historical Provider, MD  cholecalciferol (VITAMIN D) 1000 UNITS tablet Take 1,000 Units by mouth daily.   Yes Historical Provider, MD  metoprolol succinate (TOPROL-XL) 100 MG 24 hr tablet Take 100 mg by mouth daily. Take with or immediately following a meal.   Yes Historical Provider, MD  Omega-3 Fatty Acids (FISH OIL) 1000 MG CAPS Take by mouth daily.   Yes Historical Provider, MD  omeprazole (PRILOSEC) 40 MG capsule Take 40 mg by mouth daily.   Yes Historical Provider, MD  traMADol (ULTRAM) 50 MG tablet Take 1 tablet (50 mg total) by mouth every 6 (six) hours as needed. 07/19/13  Yes Fayrene Helper, PA-C  vitamin C (ASCORBIC ACID) 500 MG tablet Take 500 mg by mouth daily.   Yes Historical Provider, MD  metoprolol (TOPROL-XL) 200 MG 24 hr tablet Take 100 mg by mouth daily.    Historical Provider, MD    Physical Exam: Vitals:   02/25/16 0834 02/25/16 0835  BP: 161/71   Pulse: 64   Resp: 22   Temp:  98.3 F (36.8 C)   TempSrc: Oral   SpO2: 96% 97%     General:  Appears calm and comfortable Eyes:  PERRL, EOMI, normal lids, iris ENT:  grossly normal hearing, lips & tongue, His membranes of her mouth are pink but dry Neck:  no LAD, masses or thyromegaly Cardiovascular:  RRR, no m/r/g.  No lower extremity edema Respiratory:  CTA bilaterally, no w/r/r. Normal respiratory effort. Abdomen:  soft, ntnd, positive bowel sounds no guarding or rebounding Skin:  no rash or induration seen on limited exam Musculoskeletal:  grossly normal tone BUE/BLE, good ROM, no bony abnormality Psychiatric:  grossly normal mood and affect, speech fluent and appropriate, AOx3 Neurologic:  Alert oriented 3 speech slow and deliberate but clear positive pronator drip deviation left grip 5 out of 5 right grip 3 out of 5 left lower extremity strength 5 out of 5 right lower extremity strength 3 out of 5 sensation decreased in right foot  Labs on Admission: I have personally reviewed following labs and imaging studies  CBC:  Recent Labs Lab 02/25/16 0900 02/25/16 0909  WBC 7.8  --   NEUTROABS 5.9  --   HGB 15.0 15.6*  HCT 45.3 46.0  MCV 90.2  --   PLT 224  --    Basic Metabolic Panel:  Recent Labs Lab 02/25/16 0900 02/25/16 0909  NA 137 140  K 3.9 4.0  CL 105 103  CO2 23  --   GLUCOSE 108* 110*  BUN 16 20  CREATININE 0.80 0.80  CALCIUM 9.8  --    GFR: CrCl cannot be calculated (Unknown ideal weight.). Liver Function Tests:  Recent Labs Lab 02/25/16 0900  AST 25  ALT 17  ALKPHOS 97  BILITOT 0.6  PROT 7.8  ALBUMIN 4.2   No results for input(s): LIPASE, AMYLASE in the last 168 hours. No results for input(s): AMMONIA in the last 168 hours. Coagulation Profile:  Recent Labs Lab 02/25/16 0900  INR 0.96   Cardiac Enzymes: No results for input(s): CKTOTAL, CKMB, CKMBINDEX, TROPONINI in the last 168 hours. BNP (last 3 results) No results for input(s): PROBNP in the last 8760  hours. HbA1C: No results for input(s): HGBA1C in the last 72 hours. CBG:  Recent Labs Lab 02/25/16 0845  GLUCAP 115*   Lipid Profile: No results for input(s): CHOL, HDL, LDLCALC, TRIG, CHOLHDL, LDLDIRECT in the last 72 hours. Thyroid Function Tests: No results for input(s): TSH, T4TOTAL, FREET4, T3FREE, THYROIDAB in the last 72 hours. Anemia Panel: No results for input(s): VITAMINB12, FOLATE, FERRITIN, TIBC, IRON, RETICCTPCT in the last 72 hours. Urine analysis: No results found for: COLORURINE, APPEARANCEUR, LABSPEC, PHURINE, GLUCOSEU, HGBUR, BILIRUBINUR, KETONESUR, PROTEINUR, UROBILINOGEN, NITRITE, LEUKOCYTESUR  Creatinine Clearance: CrCl cannot be calculated (  Unknown ideal weight.).  Sepsis Labs: @LABRCNTIP (procalcitonin:4,lacticidven:4) )No results found for this or any previous visit (from the past 240 hour(s)).   Radiological Exams on Admission: Ct Head Wo Contrast  Result Date: 02/25/2016 CLINICAL DATA:  Right-sided weakness since yesterday. EXAM: CT HEAD WITHOUT CONTRAST TECHNIQUE: Contiguous axial images were obtained from the base of the skull through the vertex without intravenous contrast. COMPARISON:  None. FINDINGS: Brain: Patchy hypodensities in the subcortical white matter. Focal low-density near the left external capsule and insular cortex suggests old disease. No evidence for acute hemorrhage, mass lesion, midline shift, hydrocephalus or large infarct. Vascular: No hyperdense vessel or unexpected calcification. Skull: No acute fracture. Sinuses/Orbits: Mild mucosal thickening along the inferior right maxillary sinus. Other: None. IMPRESSION: No acute intracranial abnormality. Patchy disease in the white matter probably represents chronic small vessel ischemic changes. Electronically Signed   By: Richarda Overlie M.D.   On: 02/25/2016 09:14    EKG: Independently reviewed. Sinus or ectopic atrial rhythm Consider left atrial enlargement Low voltage, precordial leads RSR' in  V1 or V2, right VCD or RVH Assessment/Plan Principal Problem:   Right sided weakness Active Problems:   Essential hypertension   Slurred speech   CVA (cerebral infarction)   Obesity   #1. Right-sided weakness/CVA. Symptoms persist no better or worse. CT of the head with no acute intracranial abnormality. Symptoms started 7 AM yesterday outside the TPA window. Code stroke called and then discontinued -Admit to telemetry -We'll obtain an MRI/MRA carotid Dopplers and 2-D echo -Obtain a lipid panel hemoglobin A1c -Nothing by mouth until passes bedside swallow eval -PT/OT -Aspirin -Considerr statin -Await recommendations from neuro team  #2. Hypertension. Fair control in the emergency department home medications include amlodipine and metoprolol -We will hold home antihypertensives for now to allow for permissive hypertension -When necessary hydralazine  #3. Obesity. BMI greater than 30 -Nutritional consult   DVT prophylaxis: lovenox  Code Status: full  Family Communication: husband and daughter at bedside  Disposition Plan: home  Consults called: neuro   Admission status: obs    Toya Smothers M MD [facility]  If 7PM-7AM, please contact night-coverage [email or web address] Password [email or web address]  02/25/2016, 10:15 AM

## 2016-02-25 NOTE — ED Triage Notes (Signed)
Pt in from home after stroke-like symptoms beginning yesterday at 0700. Per EMS, pt started having R weakness at that time, as well as slurred speech. Hx of HTN, BP was 174/70 and CBG 120 for EMS. Pt a&ox4, R side weakness arms and legs, partial facial droop

## 2016-02-25 NOTE — Consult Note (Signed)
Neurology Consult Note  Reason for Consultation: right-sided weakness  Requesting provider: Nelva Nayobert Beaton, MD  CC: Right side weak  HPI: 75-yo RH woman who presents to the ED reporting slurred speech and right-sided weakness that started yesterday morning. History is obtained directly from the patient who is an excellent historian.   She states that she first noted some numbness in her right foot at about 0700 yesterday. This was associated with weakness of the right arm and leg as well as slurred speech. Her symptoms have persisted unchanged. She did not appreciate any symptoms in her face. She denies vision loss, difficulty swallowing, vertigo, double vision, discoordination, or balance problems. She has occasional headaches but nothing out of the ordinary today. CT head in the ED reportedly showed no acute abnormality with scattered hypodensities in the white matter of both cerebral hemispheres.    PMH:  Past Medical History:  Diagnosis Date  . CTS (carpal tunnel syndrome)   . Diverticulosis   . H/O Bell's palsy   . Hypertension   . Laryngopharyngeal reflux   . OA (osteoarthritis)   . Osteopenia   . Right sided weakness   . Seasonal allergies     PSH:  History reviewed. No pertinent surgical history.  Family history: Family History  Problem Relation Age of Onset  . CVA Mother   . Diabetes Mother   . Osteoarthritis Mother   . Hypertension Mother   . CAD Father   . Heart Problems Brother     stent  . Heart attack Brother     2-3 caths  . Melanoma Paternal Aunt     Social history:  Social History   Social History  . Marital status: Married    Spouse name: N/A  . Number of children: 3  . Years of education: N/A   Occupational History  . Not on file.   Social History Main Topics  . Smoking status: Former Games developermoker  . Smokeless tobacco: Never Used  . Alcohol use Yes  . Drug use: No  . Sexual activity: Not on file   Other Topics Concern  . Not on file    Social History Narrative  . No narrative on file    Current outpatient meds: Current Meds  Medication Sig  . amLODipine (NORVASC) 10 MG tablet Take 10 mg by mouth daily.  [name]. aspirin 81 MG tablet Take 81 mg by mouth daily.  . Calcium Carbonate-Vit D-Min (CALCIUM 1200 PO) Take by mouth daily.  . celecoxib (CELEBREX) 200 MG capsule Take 200 mg by mouth daily.  . cholecalciferol (VITAMIN D) 1000 UNITS tablet Take 1,000 Units by mouth daily.  . metoprolol succinate (TOPROL-XL) 100 MG 24 hr tablet Take 100 mg by mouth daily. Take with or immediately following a meal.  . Omega-3 Fatty Acids (FISH OIL) 1000 MG CAPS Take by mouth daily.  [name]. omeprazole (PRILOSEC) 40 MG capsule Take 40 mg by mouth daily.  . traMADol (ULTRAM) 50 MG tablet Take 1 tablet (50 mg total) by mouth every 6 (six) hours as needed.  . vitamin C (ASCORBIC ACID) 500 MG tablet Take 500 mg by mouth daily.    Current inpatient meds:  Current Facility-Administered Medications  Medication Dose Route Frequency Provider Last Rate Last Dose  .  stroke: mapping our early stages of recovery book   Does not apply Once Gwenyth BenderKaren M Black, NP      . 0.9 %  sodium chloride infusion   Intravenous STAT Nelva Nayobert Beaton, MD      .  0.9 %  sodium chloride infusion   Intravenous Continuous Gwenyth Bender, NP      . acetaminophen (TYLENOL) tablet 650 mg  650 mg Oral Q4H PRN Gwenyth Bender, NP       Or  . acetaminophen (TYLENOL) suppository 650 mg  650 mg Rectal Q4H PRN Gwenyth Bender, NP      . enoxaparin (LOVENOX) injection 40 mg  40 mg Subcutaneous Q24H Lesle Chris Black, NP      . ondansetron [facility]) injection 4 mg  4 mg Intravenous Q8H PRN Nelva Nay, MD      . senna-docusate (Senokot-S) tablet 1 tablet  1 tablet Oral QHS PRN Gwenyth Bender, NP       Current Outpatient Prescriptions  Medication Sig Dispense Refill  . amLODipine (NORVASC) 10 MG tablet Take 10 mg by mouth daily.    [name] aspirin 81 MG tablet Take 81 mg by mouth daily.    . Calcium  Carbonate-Vit D-Min (CALCIUM 1200 PO) Take by mouth daily.    . celecoxib (CELEBREX) 200 MG capsule Take 200 mg by mouth daily.    . cholecalciferol (VITAMIN D) 1000 UNITS tablet Take 1,000 Units by mouth daily.    . metoprolol succinate (TOPROL-XL) 100 MG 24 hr tablet Take 100 mg by mouth daily. Take with or immediately following a meal.    . Omega-3 Fatty Acids (FISH OIL) 1000 MG CAPS Take by mouth daily.    [name] omeprazole (PRILOSEC) 40 MG capsule Take 40 mg by mouth daily.    . traMADol (ULTRAM) 50 MG tablet Take 1 tablet (50 mg total) by mouth every 6 (six) hours as needed. 15 tablet 0  . vitamin C (ASCORBIC ACID) 500 MG tablet Take 500 mg by mouth daily.    . metoprolol (TOPROL-XL) 200 MG 24 hr tablet Take 100 mg by mouth daily.      Allergies: Allergies  Allergen Reactions  . Ace Inhibitors     Cough   . Codeine     Stomach upset     ROS: As per HPI. A full 14-point review of systems was performed and is otherwise unremarkable apart from chronic arthritis in both knees.   PE:  BP 161/71 (BP Location: Left Arm)   Pulse 64   Temp 98.3 F (36.8 C) (Oral)   Resp 22   SpO2 97%   General: WDWN, no acute distress. AAO x4. Speech with moderate dysarthria. No aphasia. Follows commands briskly. Affect is bright with congruent mood. Comportment is normal.  HEENT: Normocephalic. Neck supple without LAD. MMM, OP clear. Dentition good. Sclerae anicteric. No conjunctival injection.  CV: Regular, no murmur. Carotid pulses full and symmetric, no bruits. Distal pulses 2+ and symmetric.  Lungs: CTAB.  Abdomen: Soft, non-distended, non-tender. Bowel sounds present x4.  Extremities: No C/C/E. Neuro:  CN: Pupils are equal and round. They are symmetrically reactive from 3-->2 mm. Visual fields are notable for possible right superior quadrantanopia vs incomplete hemianopia. EOMI notable for breakup of smooth pursuits, no nystagmus. No reported diplopia. Facial sensation is intact to light touch  but she reports tingling on the right cheek. She has a mild central pattern of weakness on the R. Hearing is intact to conversational voice. Palate elevates symmetrically and uvula is midline. Voice is normal in tone, pitch and quality. Bilateral SCM and trapezii are 5/5. Tongue is midline with normal bulk and mobility.  Motor: Normal bulk, tone. Strength is normal on the L. She has a mild  right hemiparesis, arm worse than leg. Grip is 4-/5 with the remainder of the RUE 4 to 4+/5. No tremor or other abnormal movements. She has a R drift.  Sensation: Decreased to light touch and pinprick on the right. DTRs: 2+ on the left, 3+ on the right. Toes upgoing on the right, downgoing on the L. No Hoffman's.  Coordination: Finger-to-nose and heel-to-shin are without dysmetria on the L, limited by weakness on the R. Finger taps are normal on the L, slow and clumsy on the R.     Labs:  Lab Results  Component Value Date   WBC 7.8 02/25/2016   HGB 15.6 (H) 02/25/2016   HCT 46.0 02/25/2016   PLT 224 02/25/2016   GLUCOSE 110 (H) 02/25/2016   CHOL 213 (H) 02/25/2016   TRIG 137 02/25/2016   HDL 49 02/25/2016   LDLCALC 137 (H) 02/25/2016   ALT 17 02/25/2016   AST 25 02/25/2016   NA 140 02/25/2016   K 4.0 02/25/2016   CL 103 02/25/2016   CREATININE 0.80 02/25/2016   BUN 20 02/25/2016   CO2 23 02/25/2016   INR 0.96 02/25/2016   UA notable for ketones 15 Troponin 0.01 Hgb a1c pending  Imaging: I have personally and independently reviewed the [facility] without contrast from today. This shows numerous areas of hypodensity in the bihemispheric white matter that are likely representative of chronic small vessel ischemic disease though PRES could have a similar appearance.   Assessment and Plan:  1. Acute Ischemic Stroke: This is likely an acute stroke involving the L MCA territory. It is most likely a small vessel thrombotic infarct given clinical presentation. Known risk factors for cerebrovascular disease in  this patient include HTN and age. Additional workup has been ordered to include MRI brain, MRA of the head, carotid Dopplers, TTE, fasting lipids, and hemoglobin a1c. Further testing will be determined by results from these initial studies. Recommend antiplatelet therapy with aspirin for secondary stroke prevention once cleared to take oral medications--she was on aspirin 81 mg in the past but says her doctor stopped this some time ago for unclear reasons; she denies any side effects or complications. Initiate statin with goal LDL less than 70. Ensure adequate glucose control. Allow permissive hypertension in the acute phase, treating only SBP greater than 220 mmHg and/or DBP greater than 110 mmHg. Avoid fever and hyperglycemia as these can extend the infarct. Avoid hypotonic IVF to minimize exacerbation of post-stroke edema. Initiate rehab services. DVT prophylaxis as needed.   2. Right hemiparesis: This is acute, due to apparent stroke as above. PT/OT/rehab.   3. Right hemisensory loss: This is acute, due to apparent stroke. PT/OT/rehab.   4. Dysarthria: This is acute, due to apparent stroke. ST/rehab. NPO until cleared by swallow eval.   5. Visual field cut: This is somewhat inconsistent on exam. Follow. This could be due to stroke as above.   This was discussed with the patient and her family and they are in agreement with the plan as noted. They were given the opportunity to ask any questions and these were addressed to their satisfaction.   Thank you for this consultation. The stroke team will assume care of the patient starting 02/26/16.

## 2016-02-25 NOTE — ED Notes (Signed)
Pt taken to xray 

## 2016-02-26 ENCOUNTER — Inpatient Hospital Stay (HOSPITAL_COMMUNITY): Payer: Medicare Other

## 2016-02-26 DIAGNOSIS — M6289 Other specified disorders of muscle: Secondary | ICD-10-CM

## 2016-02-26 DIAGNOSIS — R4781 Slurred speech: Secondary | ICD-10-CM

## 2016-02-26 DIAGNOSIS — I6302 Cerebral infarction due to thrombosis of basilar artery: Secondary | ICD-10-CM

## 2016-02-26 DIAGNOSIS — I639 Cerebral infarction, unspecified: Secondary | ICD-10-CM

## 2016-02-26 DIAGNOSIS — E669 Obesity, unspecified: Secondary | ICD-10-CM

## 2016-02-26 DIAGNOSIS — I10 Essential (primary) hypertension: Secondary | ICD-10-CM

## 2016-02-26 LAB — RAPID URINE DRUG SCREEN, HOSP PERFORMED
AMPHETAMINES: NOT DETECTED
BENZODIAZEPINES: NOT DETECTED
Barbiturates: NOT DETECTED
COCAINE: NOT DETECTED
OPIATES: NOT DETECTED
Tetrahydrocannabinol: NOT DETECTED

## 2016-02-26 LAB — CBC
HCT: 40.3 % (ref 36.0–46.0)
Hemoglobin: 13 g/dL (ref 12.0–15.0)
MCH: 29.1 pg (ref 26.0–34.0)
MCHC: 32.3 g/dL (ref 30.0–36.0)
MCV: 90.2 fL (ref 78.0–100.0)
PLATELETS: 202 10*3/uL (ref 150–400)
RBC: 4.47 MIL/uL (ref 3.87–5.11)
RDW: 12.5 % (ref 11.5–15.5)
WBC: 7.3 10*3/uL (ref 4.0–10.5)

## 2016-02-26 LAB — BASIC METABOLIC PANEL
ANION GAP: 8 (ref 5–15)
BUN: 13 mg/dL (ref 6–20)
CALCIUM: 9.2 mg/dL (ref 8.9–10.3)
CO2: 24 mmol/L (ref 22–32)
Chloride: 104 mmol/L (ref 101–111)
Creatinine, Ser: 0.68 mg/dL (ref 0.44–1.00)
Glucose, Bld: 94 mg/dL (ref 65–99)
POTASSIUM: 3.4 mmol/L — AB (ref 3.5–5.1)
Sodium: 136 mmol/L (ref 135–145)

## 2016-02-26 LAB — HEMOGLOBIN A1C
Hgb A1c MFr Bld: 5.5 % (ref 4.8–5.6)
MEAN PLASMA GLUCOSE: 111 mg/dL

## 2016-02-26 MED ORDER — METOCLOPRAMIDE HCL 5 MG/ML IJ SOLN
10.0000 mg | Freq: Once | INTRAMUSCULAR | Status: AC
  Administered 2016-02-26: 10 mg via INTRAVENOUS
  Filled 2016-02-26: qty 2

## 2016-02-26 MED ORDER — PANTOPRAZOLE SODIUM 40 MG PO TBEC
40.0000 mg | DELAYED_RELEASE_TABLET | Freq: Every day | ORAL | Status: DC
  Administered 2016-02-26 – 2016-03-02 (×6): 40 mg via ORAL
  Filled 2016-02-26 (×6): qty 1

## 2016-02-26 MED ORDER — POTASSIUM CHLORIDE 20 MEQ PO PACK
40.0000 meq | PACK | Freq: Once | ORAL | Status: AC
  Administered 2016-02-26: 40 meq via ORAL
  Filled 2016-02-26: qty 2

## 2016-02-26 MED ORDER — ATORVASTATIN CALCIUM 40 MG PO TABS
40.0000 mg | ORAL_TABLET | Freq: Every day | ORAL | Status: DC
  Administered 2016-02-26 – 2016-03-01 (×5): 40 mg via ORAL
  Filled 2016-02-26 (×5): qty 1

## 2016-02-26 MED ORDER — TRAMADOL HCL 50 MG PO TABS
50.0000 mg | ORAL_TABLET | Freq: Four times a day (QID) | ORAL | Status: DC | PRN
  Administered 2016-02-26: 50 mg via ORAL
  Filled 2016-02-26: qty 1

## 2016-02-26 MED ORDER — OMEGA-3-ACID ETHYL ESTERS 1 G PO CAPS
1.0000 g | ORAL_CAPSULE | Freq: Every day | ORAL | Status: DC
  Administered 2016-02-26 – 2016-03-02 (×6): 1 g via ORAL
  Filled 2016-02-26 (×6): qty 1

## 2016-02-26 MED ORDER — ZOLPIDEM TARTRATE 5 MG PO TABS
5.0000 mg | ORAL_TABLET | Freq: Every evening | ORAL | Status: DC | PRN
  Administered 2016-02-26 – 2016-02-28 (×3): 5 mg via ORAL
  Filled 2016-02-26 (×3): qty 1

## 2016-02-26 MED ORDER — ASPIRIN 325 MG PO TABS
325.0000 mg | ORAL_TABLET | Freq: Once | ORAL | Status: AC
  Administered 2016-02-26: 325 mg via ORAL
  Filled 2016-02-26: qty 1

## 2016-02-26 MED ORDER — CLOPIDOGREL BISULFATE 75 MG PO TABS
75.0000 mg | ORAL_TABLET | Freq: Every day | ORAL | Status: DC
  Administered 2016-02-26 – 2016-03-02 (×6): 75 mg via ORAL
  Filled 2016-02-26 (×6): qty 1

## 2016-02-26 NOTE — Evaluation (Addendum)
Physical Therapy Evaluation Patient Details Name: Kristen Barry MRN: [id number] DOB: 11-06-1940 Today's Date: 02/26/2016   History of Present Illness  Admitted with an acute stroke involving the L MCA territory.  PMH significant for: Bell's Palsy, HTN, OA, osteopenia, carpal tunnel  Clinical Impression  Pt admitted with above diagnosis. Pt currently with functional limitations due to the deficits listed below (see PT Problem List). Pt will benefit from skilled PT to increase their independence and safety with mobility.  Pt may be an excellent rehab candidate and has great potential to be able to return home again.  Is currently at high risk for falls due to decreased functional use of RLE.    Follow Up Recommendations CIR    Equipment Recommendations  None recommended by PT    Recommendations for Other Services       Precautions / Restrictions Precautions Precautions: Fall Precaution Comments: educated pt on fall risk      Mobility  Bed Mobility Overal bed mobility: Modified Independent             General bed mobility comments: pt with abnormal movement pattern to get sitting but was able to do it independently  Transfers Overall transfer level: Needs assistance Equipment used: None Transfers: Sit to/from [employer] Sit to Stand: Min assist Stand pivot transfers: Min assist          Ambulation/Gait Ambulation/Gait assistance: Mod assist Ambulation Distance (Feet): 8 Feet (performed twice) Assistive device: 1 person hand held assist (pt held to PT's arm) Gait Pattern/deviations: Step-to pattern;Decreased stride length   Gait velocity interpretation: <1.8 ft/sec, indicative of risk for recurrent falls General Gait Details: pt with decreased coordination in RLE when stepping and abnormal gait pattern.   Stairs            Wheelchair Mobility    Modified Rankin (Stroke Patients Only) Modified Rankin (Stroke Patients Only) Pre-Morbid  Rankin Score: No symptoms Modified Rankin: Moderately severe disability     Balance Overall balance assessment: Needs assistance                                           Pertinent Vitals/Pain Pain Assessment: No/denies pain    Home Living Family/patient expects to be discharged to:: Private residence Living Arrangements: Spouse/significant other Available Help at Discharge: Available 24 hours/day Type of Home: House Home Access: Stairs to enter Entrance Stairs-Rails: Can reach both Entrance Stairs-Number of Steps: 3 Home Layout: One level        Prior Function Level of Independence: Independent         Comments: 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      Hand Dominance        Extremity/Trunk Assessment   Upper Extremity Assessment: Defer to OT evaluation (pt with decreased functional use of RUE)           Lower Extremity Assessment: RLE deficits/detail RLE Deficits / Details: decreased strength and coordination on RLE    Cervical / Trunk Assessment: Normal  Communication   Communication: Other (comment) (some difficulty with expressive speech. )  Cognition Arousal/Alertness: Awake/alert Behavior During Therapy: WFL for tasks assessed/performed Overall Cognitive Status: Within Functional Limits for tasks assessed                      General Comments General comments (skin integrity, edema,  etc.): husband present for session. states he is mostly blind and mostly deaf.     Exercises     Assessment/Plan    PT Assessment Patient needs continued PT services  PT Problem List Decreased strength;Decreased activity tolerance;Decreased balance;Decreased mobility;Decreased coordination;Decreased knowledge of use of DME          PT Treatment Interventions DME instruction;Gait training;Stair training;Functional mobility training;Therapeutic activities;Balance training;Neuromuscular re-education;Patient/family  education    PT Goals (Current goals can be found in the Care Plan section)  Acute Rehab PT Goals Patient Stated Goal: to get better PT Goal Formulation: With patient Time For Goal Achievement: 03/04/16 Potential to Achieve Goals: Good    Frequency Min 4X/week   Barriers to discharge        Co-evaluation               End of Session Equipment Utilized During Treatment: Gait belt Activity Tolerance: Patient tolerated treatment well Patient left: in chair;with call bell/phone within reach;with family/visitor present Nurse Communication: Mobility status         Time: [id number] PT Time Calculation (min) (ACUTE ONLY): 29 min   Charges:   PT Evaluation $PT Eval Moderate Complexity: 1 Procedure PT Treatments $Gait Training: 8-22 mins   PT G CodesDonnella Sham:        Revanth Neidig J 02/26/2016, 1:41 PM  Lavona MoundMark Sheryll Dymek, PT  [phone] 02/26/2016

## 2016-02-26 NOTE — Progress Notes (Signed)
VASCULAR LAB PRELIMINARY  PRELIMINARY  PRELIMINARY  PRELIMINARY  Carotid duplex completed.    Preliminary report:  1-39% ICA plaquing.  Vertebral artery flow is antegrade.   Maize Brittingham, RVT 02/26/2016, 10:19 AM

## 2016-02-26 NOTE — Evaluation (Signed)
Speech Language Pathology Evaluation Patient Details Name: Kristen Barry MRN: [id number] DOB: May 17, 1941 Today's Date: 02/26/2016 Time: [id number] SLP Time Calculation (min) (ACUTE ONLY): 12 min  Problem List:  Patient Active Problem List   Diagnosis Date Noted  . Right sided weakness 02/25/2016  . Slurred speech 02/25/2016  . CVA (cerebral infarction) 02/25/2016  . Obesity 02/25/2016  . Essential hypertension 07/10/2013   Past Medical History:  Past Medical History:  Diagnosis Date  . CTS (carpal tunnel syndrome)   . Diverticulosis   . H/O Bell's palsy   . Hypertension   . Laryngopharyngeal reflux   . OA (osteoarthritis)   . Osteopenia   . Right sided weakness   . Seasonal allergies    Past Surgical History: History reviewed. No pertinent surgical history. HPI:  75 y.o. female admitted with an acute stroke in the left paracentral pons.  PMH significant for: Bell's Palsy, HTN, OA, osteopenia, carpal tunnel   Assessment / Plan / Recommendation Clinical Impression  Pt presents with dysarthric speech with staccato-like productions and monoprosodic output; intelligibility is only mildly impaired.  Language comprehension and expression are WNL with no difficulty with naming, f/c, reading.  Recommend SLP f/u for speech production - family agrees.  Will follow.      SLP Assessment  Patient needs continued Speech Lanaguage Pathology Services    Follow Up Recommendations  Inpatient Rehab    Frequency and Duration min 1 x/week  1 week      SLP Evaluation Cognition  Overall Cognitive Status: Within Functional Limits for tasks assessed Arousal/Alertness: Awake/alert Orientation Level: Oriented X4       Comprehension  Auditory Comprehension Overall Auditory Comprehension: Appears within functional limits for tasks assessed Yes/No Questions: Within Functional Limits Commands: Within Functional Limits Conversation: Complex Visual  Recognition/Discrimination Discrimination: Within Function Limits Reading Comprehension Reading Status: Within funtional limits    Expression Verbal Expression Overall Verbal Expression: Appears within functional limits for tasks assessed Repetition: No impairment Naming: No impairment   Oral / Motor  Oral Motor/Sensory Function Overall Oral Motor/Sensory Function: Mild impairment (right CN VII) Motor Speech Overall Motor Speech: Impaired Respiration: Within functional limits Phonation: Normal Resonance: Within functional limits Articulation: Impaired Intelligibility: Intelligibility reduced Word: 75-100% accurate Phrase: 75-100% accurate Sentence: 75-100% accurate Conversation: 75-100% accurate   GO                   Joannah Gitlin L. Samson Fredericouture, [place] CCC/SLP Pager [phone]  Blenda MountsCouture, Nykolas Bacallao Laurice 02/26/2016, 3:12 PM

## 2016-02-26 NOTE — Progress Notes (Signed)
MD on call paged twice for PRN headache medications since Tylenol had not worked. Pt also requesting sleep medication. Continuing to wait for call back. Kamare Caspers, [place] ScrapeSarah E, RN

## 2016-02-26 NOTE — Progress Notes (Signed)
STROKE TEAM PROGRESS NOTE   HISTORY OF PRESENT ILLNESS (per record) 75-yo RH woman who presents to the ED reporting slurred speech and right-sided weakness that started yesterday morning. History is obtained directly from the patient who is an excellent historian.   She states that she first noted some numbness in her right foot at about 0700 yesterday. This was associated with weakness of the right arm and leg as well as slurred speech. Her symptoms have persisted unchanged. She did not appreciate any symptoms in her face. She denies vision loss, difficulty swallowing, vertigo, double vision, discoordination, or balance problems. She has occasional headaches but nothing out of the ordinary today. CT head in the ED reportedly showed no acute abnormality with scattered hypodensities in the white matter of both cerebral hemispheres.     SUBJECTIVE (INTERVAL HISTORY) Her husband and son are at the bedside.  Overall she feels her condition is gradually improving. She still has right hemiparesis with mild slurred speech and right facial droop.    OBJECTIVE Temp:  [98.1 F (36.7 C)-99.1 F (37.3 C)] 98.8 F (37.1 C) (09/30 0600) Pulse Rate:  [62-79] 73 (09/30 0600) Cardiac Rhythm: Normal sinus rhythm (09/29 2107) Resp:  [15-24] 18 (09/30 0600) BP: (118-204)/(50-103) 148/66 (09/30 0600) SpO2:  [94 %-100 %] 99 % (09/30 0600)  CBC:  Recent Labs Lab 02/25/16 0900 02/25/16 0909 02/26/16 0601  WBC 7.8  --  7.3  NEUTROABS 5.9  --   --   HGB 15.0 15.6* 13.0  HCT 45.3 46.0 40.3  MCV 90.2  --  90.2  PLT 224  --  202    Basic Metabolic Panel:  Recent Labs Lab 02/25/16 0900 02/25/16 0909  NA 137 140  K 3.9 4.0  CL 105 103  CO2 23  --   GLUCOSE 108* 110*  BUN 16 20  CREATININE 0.80 0.80  CALCIUM 9.8  --     Lipid Panel:    Component Value Date/Time   CHOL 213 (H) 02/25/2016 0951   TRIG 137 02/25/2016 0951   HDL 49 02/25/2016 0951   CHOLHDL 4.3 02/25/2016 0951   VLDL 27  02/25/2016 0951   LDLCALC 137 (H) 02/25/2016 0951   HgbA1c:  Lab Results  Component Value Date   HGBA1C 5.5 02/25/2016   Urine Drug Screen: No results found for: LABOPIA, COCAINSCRNUR, LABBENZ, AMPHETMU, THCU, LABBARB    IMAGING I have personally reviewed the radiological images below and agree with the radiology interpretations.  Dg Chest 2 View 02/25/2016  Cardiomegaly and aortic atherosclerosis.  No active lung disease.    Ct Head Wo Contrast 02/25/2016 No acute intracranial abnormality. Patchy disease in the white matter probably represents chronic small vessel ischemic changes.  Mr Maxine GlennMra Head/brain Wo Cm 02/25/2016 Acute infarct left paracentral pons. Moderate chronic microvascular ischemic change.  Negative MRA head.   CUS - 1-39% ICA plaquing.  Vertebral artery flow is antegrade.    TTE - pending   PHYSICAL EXAM  Temp:  [98.1 F (36.7 C)-99.1 F (37.3 C)] 98.8 F (37.1 C) (09/30 0600) Pulse Rate:  [65-79] 73 (09/30 0600) Resp:  [15-19] 18 (09/30 0600) BP: (118-148)/(57-85) 148/66 (09/30 0600) SpO2:  [94 %-100 %] 99 % (09/30 0600)  General - Well nourished, well developed, in no apparent distress.  Ophthalmologic - Sharp disc margins OU.   Cardiovascular - Regular rate and rhythm.  Mental Status -  Level of arousal and orientation to time, place, and person were intact. Language including expression, naming,  repetition, comprehension was assessed and found intact, mild dysarthria. Fund of Knowledge was assessed and was intact.  Cranial Nerves II - XII - II - Visual field intact OU. III, IV, VI - Extraocular movements intact. V - Facial sensation intact bilaterally. VII - mild right facial droop. VIII - Hearing & vestibular intact bilaterally. X - Palate elevates symmetrically, mild dysarthria. XI - Chin turning & shoulder shrug intact bilaterally. XII - Tongue protrusion intact.  Motor Strength - The patient's strength was 5/5 LUE and LLE, 4/5 RUE and  RLE and pronator drift was present on the right.  Bulk was normal and fasciculations were absent.   Motor Tone - Muscle tone was assessed at the neck and appendages and was normal.  Reflexes - The patient's reflexes were 1+ in all extremities and she had no pathological reflexes.  Sensory - Light touch, temperature/pinprick were assessed and were symmetrical.    Coordination - The patient had normal movements in the hands with no ataxia or dysmetria.  Tremor was absent.  Gait and Station - deferred.     ASSESSMENT/PLAN Ms. Kristen Barry is a 75 y.o. female with history of hypertension and Bell's palsy,  presenting with right-sided weakness and slurred speech. She did not receive IV t-PA due to late presentation.  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  Resultant  right hemiparesis  MRI  Acute infarct left paracentral pons.  MRA  negative  Carotid Doppler unremarkable.  2D Echo - pending  LDL - 137  HgbA1c - 5.5  VTE prophylaxis - Lovenox  Diet Heart Room service appropriate? Yes; Fluid consistency: Thin  aspirin 81 mg daily prior to admission, now on aspirin 325 mg daily. Recommend to switch to Plavix for stroke prevention.   Patient counseled to be compliant with her antithrombotic medications  Ongoing aggressive stroke risk factor management  Therapy recommendations:  pending  Disposition:  Pending  Hypertension  Stable  Permissive hypertension (OK if < 220/120) but gradually normalize in 5-7 days  Long-term BP goal normotensive  Hyperlipidemia  Home meds: No lipid lowering medications prior to admission  LDL 137, goal < 70  Now on Lipitor 40 mg daily  Continue statin at discharge  Other Stroke Risk Factors  Advanced age  Former smoker  ETOH use, advised to drink no more than 1 drink per day  Obesity, recommend weight loss, diet and exercise as appropriate   Family hx stroke (mother)  Other Active  Problems    Hospital day # 1  Neurology will sign off. Please call with questions. Pt will follow up with Darrol Angel, NP at [facility] in about 6 weeks. Thanks for the consult.  Marvel Plan, MD PhD Stroke Neurology 02/26/2016 2:59 PM     To contact Stroke Continuity provider, please refer to [email or web address]. After hours, contact General Neurology

## 2016-02-26 NOTE — Progress Notes (Signed)
PROGRESS NOTE    Kristen Barry  ZOX:[id number] DOB: [birth year] DOA: 02/25/2016 PCP: Lenora Boys, MD   Brief Narrative:  The patient is a very pleasant 75 yo Caucasian female with a PMH of HTN, Bells Palsy, Osteoarthritis, and other comorbid conditions who presented to [facility] with Right Sided Weakness. Symptoms started the day prior and patient developed Right Arm and Leg weakness, along with tingling in Right foot, as well as slurred speedh and a partial facial droop which started around 7 Am. She came to the [facility] ED for Evaluation and was worked up and had a CT Head and MRI/MRA. MRI showed Acute infarct of Left Paracentral Pons and she was admitted for further Evaluation and workup.   Assessment & Plan:   Principal Problem:   CVA (cerebral infarction) Active Problems:   Essential hypertension   Right sided weakness   Slurred speech   Obesity  1. 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 Hypokalemia 3. Hypertension 4. Hyperlipidemia 5. Osteoarthritis 6. GERD  PLAN  1. 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 Discussed Case with Dr. Roda Shutters and patient was started on Plavix  -CT Head showed No acute intracranial abnormality. Patchy disease in the white matter probably represents chronic small vessel ischemic changes. -MRI/MRA of Head Acute infarct left paracentral pons. Moderate chronic microvascular ischemic change. Negative MRA head. -Carotid Dopplers done and pending read. -Transthoracic Echocardiogram ordered and pending to be done -Lipid Panel showed Cholesterol of 213, TG of 137, HDL of 49, and LDL of 137, VLDL of 27 -HbA1c Pending -ASA 325 mg po given today; Will D/C because started on Plavix 75 mg po Daily by Neurology -C/w Atorvastatin 40 mg -PT/OT/SLP Evaluate and treat; Falls Precautions -Neurochecks per protocol -Continue with Telemetry -Allow for  Permissive HTN  2. Hypokalemia -Patient's Potassium was 3.4 this AM -Repleted with po Kcl 40 mEQ -Repeat Potassium in AM  3. Hypertension -Allow for Permissive HTN -BP has been ranging from 148-170 SBP and 66-74 DBP -Will Restart Amlodipine 10 mg po Daily, and Metoprolol XL 100 mg po Daily in AM  4. Hyperlipidemia -Lipid Panel showed Cholesterol 213, TG of 137, HDL of 49, and LDL of 137, VLDL of 27 -Continue with Atorvastatin 40 mg po Daily  5. Osteoarthritis -Hold Celecoxib 200 mg po Daily; -C/w Tramadol 50 mg po q6hprn and Acteaminophen 650 mg po q4hprn  6. GERD -C/w Protonix 40 mg po Daily  DVT prophylaxis: Lovenox 40 mg po Daily; SCD's Code Status: Full Family Communication: Discussed with Patient's Daughter and Son's as well as Husband. They are in agreement with the plan of care and all questions answered to patient's and families satisfaction.  Disposition Plan: Inpatient Rehab vs. SNF  Consultants:   -Neurology - Stroke Team   Procedures: None Antimicrobials: None  Subjective:  Patient was seen and examined this AM at bedside and she felt as if her right side was stronger. Denied Cp, SOB, N/V or Abdominal Pain. Had no Dizziness but had some Right facial droop which she said was not new.   Objective: Vitals:   02/26/16 0159 02/26/16 0400 02/26/16 0600 02/26/16 1803  BP: 135/67 (!) 132/59 (!) 148/66 (!) 170/74  Pulse: 71 67 73 71  Resp: 16 18 18 20   Temp: 98.4 F (36.9 C) 99 F (37.2 C) 98.8 F (37.1 C) 98.7 F (37.1 C)  TempSrc: Oral Oral Oral Oral  SpO2: 95%  94% 99% 98%    Intake/Output Summary (Last 24 hours) at 02/26/16 1821 Last data filed at 02/26/16 1336  Gross per 24 hour  Intake          1988.25 ml  Output              950 ml  Net          1038.25 ml   There were no vitals filed for this visit.  Examination: Physical Exam:  Constitutional: WN/WD obese, NAD and appears calm and comfortable sitting in chair Eyes:  Patient has had  bilateral Cataract surgery and pupils are distorded, Right sided Lid Droop. sclerae anicteric  ENMT: External Ears, Nose appear normal. Grossly normal hearing. Mucous membranes are moist.  Normal dentition.  Neck: Appears normal, supple, no cervical masses, normal ROM, no appreciable thyromegaly, no JVD.  Respiratory: Clear to auscultation bilaterally, no wheezing, rales, rhonchi or crackles. Normal respiratory effort and patient is not tachypenic. No accessory muscle use.  Cardiovascular: RRR, no murmurs / rubs / gallops. S1 and S2 auscultated. No extremity edema. 2+ pedal pulses. No carotid bruits.  Abdomen: Soft, non-tender, non-distended. No masses palpated. No appreciable hepatosplenomegaly. Bowel sounds positive x4.  GU: Deferred. Musculoskeletal: No clubbing / cyanosis of digits/nails. No joint deformity upper and lower extremities.No contractures. Normal strength and muscle tone.  Skin: No rashes, lesions, ulcers. No induration; Warm and dry.  Neurologic: CN 2-12 not intact as patient has facial droop and lid droop. Strength 4/5 in Right Compared to 5/5 In Left. Romberg sign cerebellar reflexes not assessed. Speech is dysarthric.  Psychiatric: Normal judgment and insight. Alert and oriented x 3. Normal mood and appropriate and pleasant affect.   Data Reviewed: I have reviewed the following.   CBC:  Recent Labs Lab 02/25/16 0900 02/25/16 0909 02/26/16 0601  WBC 7.8  --  7.3  NEUTROABS 5.9  --   --   HGB 15.0 15.6* 13.0  HCT 45.3 46.0 40.3  MCV 90.2  --  90.2  PLT 224  --  202   Basic Metabolic Panel:  Recent Labs Lab 02/25/16 0900 02/25/16 0909 02/26/16 0601  NA 137 140 136  K 3.9 4.0 3.4*  CL 105 103 104  CO2 23  --  24  GLUCOSE 108* 110* 94  BUN 16 20 13   CREATININE 0.80 0.80 0.68  CALCIUM 9.8  --  9.2   GFR: CrCl cannot be calculated (Unknown ideal weight.). Liver Function Tests:  Recent Labs Lab 02/25/16 0900  AST 25  ALT 17  ALKPHOS 97  BILITOT 0.6    PROT 7.8  ALBUMIN 4.2   No results for input(s): LIPASE, AMYLASE in the last 168 hours. No results for input(s): AMMONIA in the last 168 hours. Coagulation Profile:  Recent Labs Lab 02/25/16 0900  INR 0.96   Cardiac Enzymes: No results for input(s): CKTOTAL, CKMB, CKMBINDEX, TROPONINI in the last 168 hours. BNP (last 3 results) No results for input(s): PROBNP in the last 8760 hours. HbA1C:  Recent Labs  02/25/16 0900  HGBA1C 5.5   CBG:  Recent Labs Lab 02/25/16 0845  GLUCAP 115*   Lipid Profile:  Recent Labs  02/25/16 0951  CHOL 213*  HDL 49  LDLCALC 137*  TRIG 137  CHOLHDL 4.3   Thyroid Function Tests: No results for input(s): TSH, T4TOTAL, FREET4, T3FREE, THYROIDAB in the last 72 hours. Anemia Panel: No results for input(s): VITAMINB12, FOLATE, FERRITIN, TIBC, IRON, RETICCTPCT in the last 72 hours. Sepsis  Labs: No results for input(s): PROCALCITON, LATICACIDVEN in the last 168 hours.  No results found for this or any previous visit (from the past 240 hour(s)).   Radiology Studies: Dg Chest 2 View  Result Date: 02/25/2016 CLINICAL DATA:  Right-sided weakness since yesterday.  Hypertension. EXAM: CHEST  2 VIEW COMPARISON:  None. FINDINGS: Mild to moderate cardiomegaly.  Aortic atherosclerosis. No evidence of pulmonary infiltrate or edema. No evidence of pneumothorax or pleural effusion. Thoracic spine degenerative changes and mild dextroscoliosis noted. IMPRESSION: Cardiomegaly and aortic atherosclerosis.  No active lung disease. Electronically Signed   By: Myles Rosenthal M.D.   On: 02/25/2016 10:21   Ct Head Wo Contrast  Result Date: 02/25/2016 CLINICAL DATA:  Right-sided weakness since yesterday. EXAM: CT HEAD WITHOUT CONTRAST TECHNIQUE: Contiguous axial images were obtained from the base of the skull through the vertex without intravenous contrast. COMPARISON:  None. FINDINGS: Brain: Patchy hypodensities in the subcortical white matter. Focal low-density  near the left external capsule and insular cortex suggests old disease. No evidence for acute hemorrhage, mass lesion, midline shift, hydrocephalus or large infarct. Vascular: No hyperdense vessel or unexpected calcification. Skull: No acute fracture. Sinuses/Orbits: Mild mucosal thickening along the inferior right maxillary sinus. Other: None. IMPRESSION: No acute intracranial abnormality. Patchy disease in the white matter probably represents chronic small vessel ischemic changes. Electronically Signed   By: Richarda Overlie M.D.   On: 02/25/2016 09:14   Mr Brain Wo Contrast  Result Date: 02/25/2016 CLINICAL DATA:  Right-sided weakness and slurred speech. EXAM: MRI HEAD WITHOUT CONTRAST MRA HEAD WITHOUT CONTRAST TECHNIQUE: Multiplanar, multiecho pulse sequences of the brain and surrounding structures were obtained without intravenous contrast. Angiographic images of the head were obtained using MRA technique without contrast. COMPARISON:  CT head 02/25/2016 FINDINGS: MRI HEAD FINDINGS Brain: Acute infarct in the left paracentral pons measuring approximately 10 x 15 mm. No other areas of acute infarct identified. Ventricle size normal. Cerebral volume normal for age. Moderate chronic microvascular ischemic change in the white matter diffusely. Chronic ischemic change in the pons. Negative for hemorrhage or mass lesion. No shift of the midline structures. Pituitary normal in size. Vascular: Normal flow voids. Skull and upper cervical spine: Negative Sinuses/Orbits: Mild mucosal edema right maxillary sinus. Normal orbits. Other: None MRA HEAD FINDINGS Both vertebral arteries patent to the basilar. Right PICA patent. Left PICA not visualized. Basilar widely patent. Superior cerebellar and posterior cerebral arteries patent bilaterally. Fetal origin left posterior cerebral artery. Internal carotid artery widely patent. Anterior and middle cerebral arteries patent bilaterally. Hypoplastic left A1 segment. Both anterior  cerebral arteries supplied from the right. Negative for cerebral aneurysm. IMPRESSION: Acute infarct left paracentral pons. Moderate chronic microvascular ischemic change. Negative MRA head. Electronically Signed   By: Marlan Palau M.D.   On: 02/25/2016 13:39   Mr Maxine Glenn Head/brain BJ Cm  Result Date: 02/25/2016 CLINICAL DATA:  Right-sided weakness and slurred speech. EXAM: MRI HEAD WITHOUT CONTRAST MRA HEAD WITHOUT CONTRAST TECHNIQUE: Multiplanar, multiecho pulse sequences of the brain and surrounding structures were obtained without intravenous contrast. Angiographic images of the head were obtained using MRA technique without contrast. COMPARISON:  CT head 02/25/2016 FINDINGS: MRI HEAD FINDINGS Brain: Acute infarct in the left paracentral pons measuring approximately 10 x 15 mm. No other areas of acute infarct identified. Ventricle size normal. Cerebral volume normal for age. Moderate chronic microvascular ischemic change in the white matter diffusely. Chronic ischemic change in the pons. Negative for hemorrhage or mass lesion. No shift of  the midline structures. Pituitary normal in size. Vascular: Normal flow voids. Skull and upper cervical spine: Negative Sinuses/Orbits: Mild mucosal edema right maxillary sinus. Normal orbits. Other: None MRA HEAD FINDINGS Both vertebral arteries patent to the basilar. Right PICA patent. Left PICA not visualized. Basilar widely patent. Superior cerebellar and posterior cerebral arteries patent bilaterally. Fetal origin left posterior cerebral artery. Internal carotid artery widely patent. Anterior and middle cerebral arteries patent bilaterally. Hypoplastic left A1 segment. Both anterior cerebral arteries supplied from the right. Negative for cerebral aneurysm. IMPRESSION: Acute infarct left paracentral pons. Moderate chronic microvascular ischemic change. Negative MRA head. Electronically Signed   By: Marlan Palauharles  Clark M.D.   On: 02/25/2016 13:39   Scheduled Meds: .   stroke: mapping our early stages of recovery book   Does not apply Once  . atorvastatin  40 mg Oral q1800  . clopidogrel  75 mg Oral Daily  . enoxaparin (LOVENOX) injection  40 mg Subcutaneous Q24H  . omega-3 acid ethyl esters  1 g Oral Daily  . pantoprazole  40 mg Oral Daily   Continuous Infusions: . sodium chloride 50 mL/hr at 02/25/16 2037     LOS: 1 day   Merlene Laughtermair Latif Sheikh, DO [facility] Pager [phone]  If 7PM-7AM, please contact night-coverage [email or web address] Password [email or web address] 02/26/2016, 6:21 PM

## 2016-02-26 NOTE — Progress Notes (Signed)
Rehab Admissions Coordinator Note:  Patient was screened by Trish MageLogue, Prudy Candy M for appropriateness for an Inpatient Acute Rehab Consult.  At this time, we are recommending Inpatient Rehab consult.  Trish MageLogue, Leon Goodnow M 02/26/2016, 3:11 PM  I can be reached at [phone].

## 2016-02-27 ENCOUNTER — Inpatient Hospital Stay (HOSPITAL_COMMUNITY): Payer: Medicare Other

## 2016-02-27 DIAGNOSIS — R531 Weakness: Secondary | ICD-10-CM

## 2016-02-27 DIAGNOSIS — I6789 Other cerebrovascular disease: Secondary | ICD-10-CM

## 2016-02-27 LAB — MAGNESIUM: Magnesium: 2.1 mg/dL (ref 1.7–2.4)

## 2016-02-27 LAB — ECHOCARDIOGRAM COMPLETE

## 2016-02-27 LAB — CBC
HEMATOCRIT: 40.1 % (ref 36.0–46.0)
Hemoglobin: 13 g/dL (ref 12.0–15.0)
MCH: 29.2 pg (ref 26.0–34.0)
MCHC: 32.4 g/dL (ref 30.0–36.0)
MCV: 90.1 fL (ref 78.0–100.0)
Platelets: 210 10*3/uL (ref 150–400)
RBC: 4.45 MIL/uL (ref 3.87–5.11)
RDW: 12.6 % (ref 11.5–15.5)
WBC: 6.8 10*3/uL (ref 4.0–10.5)

## 2016-02-27 LAB — BASIC METABOLIC PANEL
Anion gap: 8 (ref 5–15)
BUN: 11 mg/dL (ref 6–20)
CALCIUM: 9.4 mg/dL (ref 8.9–10.3)
CO2: 25 mmol/L (ref 22–32)
CREATININE: 0.64 mg/dL (ref 0.44–1.00)
Chloride: 106 mmol/L (ref 101–111)
GFR calc non Af Amer: >60 mL/min (ref >60)
Glucose, Bld: 98 mg/dL (ref 65–99)
Potassium: 4 mmol/L (ref 3.5–5.1)
SODIUM: 139 mmol/L (ref 135–145)

## 2016-02-27 LAB — PHOSPHORUS: PHOSPHORUS: 3.1 mg/dL (ref 2.5–4.6)

## 2016-02-27 MED ORDER — PERFLUTREN LIPID MICROSPHERE
1.0000 mL | INTRAVENOUS | Status: AC | PRN
  Administered 2016-02-27: 2 mL via INTRAVENOUS
  Filled 2016-02-27: qty 10

## 2016-02-27 NOTE — Progress Notes (Signed)
PROGRESS NOTE    Kristen Barry  ZOX:[id number] DOB: Jul 23, 1940 DOA: 02/25/2016 PCP: Lenora Boys, MD   Brief Narrative:  The patient is a very pleasant 75 yo Caucasian female with a PMH of HTN, Bells Palsy, Osteoarthritis, and other comorbid conditions who presented to [facility] with Right Sided Weakness. Symptoms started the day prior and patient developed Right Arm and Leg weakness, along with tingling in Right foot, as well as slurred speedh and a partial facial droop which started around 7 Am. She came to the [facility] ED for Evaluation and was worked up and had a CT Head and MRI/MRA. MRI showed Acute infarct of Left Paracentral Pons and she was admitted for further Evaluation and workup. Stroke Team was consult and patient worked with OT and PT who recommended CIR. Patient to be evaluated by Rehab Physician.   Assessment & Plan:   Principal Problem:   CVA (cerebral infarction) Active Problems:   Essential hypertension   Right sided weakness   Slurred speech   Obesity   Acute CVA (cerebrovascular accident) (HCC)  1. 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 Hypokalemia, improved 3. Hypertension, stable 4. Hyperlipidemia 5. Osteoarthritis 6. GERD  PLAN  1. 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 Discussed Case with Dr. Roda Shutters and patient was started on Plavix  -CT Head showed No acute intracranial abnormality. Patchy disease in the white matter probably represents chronic small vessel ischemic changes. -MRI/MRA of Head Acute infarct left paracentral pons. Moderate chronic microvascular ischemic change. Negative MRA head. -Carotid Dopplers done and pending read; Preliminary report showed 1-39% ICA plaquing.  Vertebral artery flow is antegrade. -Transthoracic Echocardiogram showed mild basal septal LV hypertrophy with LVEF 60-65%. Grade 1 diastolic  dysfunction. Upper normal left atrial chamber size. Mild mitral annular calcification. Mildly sclerotic aortic valve  with trivial aortic regurgitation. Trivial tricuspid regurgitation. -Lipid Panel showed Cholesterol of 213, TG of 137, HDL of 49, and LDL of 137, VLDL of 27 -HbA1c 5.5 -C/w Plavix 75 mg po Daily by Neurology -C/w Atorvastatin 40 mg -PT/OT/SLP Evaluate and treat; Falls Precautions - PT and OT recommend Inpatient Rehab -Lelon Frohlich Rehab Admissions Coordinator evaluated patient and recommended Inpatient Rehab Consult -Inpatient Rehab Consulted and awaiting evaluation. Consulted Social Work for [facility] SNF placement if unable to be accepted to CIR.  -Neurochecks per protocol -Continue with Telemetry -Allow for Permissive HTN  2. Hypokalemia, improved -Patient's Potassium was 4.0 this AM -Repleted with po Kcl 40 mEQ yesterday -Repeat BMP in AM   3. Hypertension -Allow for Permissive HTN of <220/120 -BP has been ranging from 136-140 SBP and 66-75 DBP -Will Restart Amlodipine 10 mg po Daily, and Metoprolol XL 100 mg po Daily in a few days  4. Hyperlipidemia -Lipid Panel showed Cholesterol 213, TG of 137, HDL of 49, and LDL of 137, VLDL of 27 -Continue with Atorvastatin 40 mg po Daily  5. Osteoarthritis -Hold Celecoxib 200 mg po Daily; -C/w Tramadol 50 mg po q6hprn and Acteaminophen 650 mg po q4hprn  6. GERD -C/w Protonix 40 mg po Daily  DVT prophylaxis: Lovenox 40 mg po Daily; SCD's Code Status: Full Family Communication: Discussed with Patient's Daughter and Son's as well as Husband. They are in agreement with the plan of care and all questions answered to patient's and families satisfaction.  Disposition Plan: Inpatient Rehab vs. SNF Consultants:   -Neurology - Stroke Team  Procedures: None Antimicrobials: None  Subjective:  Patient was seen and examined this AM at bedside and she felt as if her right side was stronger and she was able to move around  with her walker better. Denied Cp, SOB, N/V or Abdominal Pain. Still had some slurred speech but was doing better she thought.    Objective: Vitals:   02/27/16 0100 02/27/16 0500 02/27/16 0921 02/27/16 1415  BP: (!) 141/52 140/66 136/73 139/75  Pulse: (!) 59 70 73 75  Resp: 20 18 20 18   Temp: 98.6 F (37 C) 98.9 F (37.2 C) 98.2 F (36.8 C) 98.5 F (36.9 C)  TempSrc: Oral Oral Oral Oral  SpO2: 97% 98% 95% 97%    Intake/Output Summary (Last 24 hours) at 02/27/16 1621 Last data filed at 02/27/16 0600  Gross per 24 hour  Intake              720 ml  Output                0 ml  Net              720 ml   There were no vitals filed for this visit.  Examination: Physical Exam:  Constitutional: WN/WD obese, NAD and appears calm and comfortable sitting in chair Eyes:  Patient has had bilateral Cataract surgery and pupils are distorded, Right sided Lid Droop. sclerae anicteric  ENMT: External Ears, Nose appear normal. Grossly normal hearing. Mucous membranes are moist.  Normal dentition.  Neck: Appears normal, supple, no cervical masses, normal ROM, no appreciable thyromegaly, no JVD.  Respiratory: Clear to auscultation bilaterally, no wheezing, rales, rhonchi or crackles. Normal respiratory effort and patient is not tachypenic. No accessory muscle use.  Cardiovascular: RRR, no murmurs / rubs / gallops. S1 and S2 auscultated. No extremity edema. 2+ pedal pulses. No carotid bruits.  Abdomen: Soft, non-tender, non-distended. No masses palpated. No appreciable hepatosplenomegaly. Bowel sounds positive x4.  GU: Deferred. Musculoskeletal: No clubbing / cyanosis of digits/nails. No joint deformity upper and lower extremities.No contractures. Normal strength and muscle tone.  Skin: No rashes, lesions, ulcers. No induration; Warm and dry.  Neurologic: CN 2-12 not intact as patient has facial droop and lid droop. Strength 4/5 in Right Compared to 5/5 In Left. Romberg sign cerebellar reflexes not  assessed. Speech is still dysarthric. Patient able to raise Right Leg higher today.  Psychiatric: Normal judgment and insight. Alert and oriented x 3. Normal mood and appropriate and pleasant affect.   Data Reviewed: I have reviewed the following.   CBC:  Recent Labs Lab 02/25/16 0900 02/25/16 0909 02/26/16 0601 02/27/16 0637  WBC 7.8  --  7.3 6.8  NEUTROABS 5.9  --   --   --   HGB 15.0 15.6* 13.0 13.0  HCT 45.3 46.0 40.3 40.1  MCV 90.2  --  90.2 90.1  PLT 224  --  202 210   Basic Metabolic Panel:  Recent Labs Lab 02/25/16 0900 02/25/16 0909 02/26/16 0601 02/27/16 0637  NA 137 140 136 139  K 3.9 4.0 3.4* 4.0  CL 105 103 104 106  CO2 23  --  24 25  GLUCOSE 108* 110* 94 98  BUN 16 20 13 11   CREATININE 0.80 0.80 0.68 0.64  CALCIUM 9.8  --  9.2 9.4  MG  --   --   --  2.1  PHOS  --   --   --  3.1   GFR: CrCl cannot be calculated (  Unknown ideal weight.). Liver Function Tests:  Recent Labs Lab 02/25/16 0900  AST 25  ALT 17  ALKPHOS 97  BILITOT 0.6  PROT 7.8  ALBUMIN 4.2   No results for input(s): LIPASE, AMYLASE in the last 168 hours. No results for input(s): AMMONIA in the last 168 hours. Coagulation Profile:  Recent Labs Lab 02/25/16 0900  INR 0.96   Cardiac Enzymes: No results for input(s): CKTOTAL, CKMB, CKMBINDEX, TROPONINI in the last 168 hours. BNP (last 3 results) No results for input(s): PROBNP in the last 8760 hours. HbA1C:  Recent Labs  02/25/16 0900  HGBA1C 5.5   CBG:  Recent Labs Lab 02/25/16 0845  GLUCAP 115*   Lipid Profile:  Recent Labs  02/25/16 0951  CHOL 213*  HDL 49  LDLCALC 137*  TRIG 137  CHOLHDL 4.3   Thyroid Function Tests: No results for input(s): TSH, T4TOTAL, FREET4, T3FREE, THYROIDAB in the last 72 hours. Anemia Panel: No results for input(s): VITAMINB12, FOLATE, FERRITIN, TIBC, IRON, RETICCTPCT in the last 72 hours. Sepsis Labs: No results for input(s): PROCALCITON, LATICACIDVEN in the last 168  hours.  No results found for this or any previous visit (from the past 240 hour(s)).   Radiology Studies: No results found. Scheduled Meds: .  stroke: mapping our early stages of recovery book   Does not apply Once  . atorvastatin  40 mg Oral q1800  . clopidogrel  75 mg Oral Daily  . enoxaparin (LOVENOX) injection  40 mg Subcutaneous Q24H  . omega-3 acid ethyl esters  1 g Oral Daily  . pantoprazole  40 mg Oral Daily   Continuous Infusions: . sodium chloride 50 mL/hr at 02/26/16 2010    LOS: 2 days   Merlene Laughter, DO [facility] Pager [phone]  If 7PM-7AM, please contact night-coverage [email or web address] Password [email or web address] 02/27/2016, 4:21 PM

## 2016-02-27 NOTE — Progress Notes (Signed)
  Echocardiogram 2D Echocardiogram with Definity has been performed.  Leta JunglingCooper, Miking Usrey M 02/27/2016, 2:02 PM

## 2016-02-27 NOTE — Evaluation (Signed)
Occupational Therapy Evaluation Patient Details Name: Kristen Barry MRN: [id number] DOB: 04-04-1941 Today's Date: 02/27/2016    History of Present Illness Admitted with an acute stroke involving the L MCA territory.  PMH significant for: Bell's Palsy, HTN, OA, osteopenia, carpal tunnel   Clinical Impression   Pt was independent prior to admission. Presents with L side weakness, decreased balance and impaired speech interfering with ability to perform at her baseline. Pt currently requires min to total assist with ADL. She is able to walk short distances with moderate assistance. Pt is an excellent rehab candidate. Will follow acutely.    Follow Up Recommendations  CIR;Supervision/Assistance - 24 hour    Equipment Recommendations   (defer to next venue)    Recommendations for Other Services       Precautions / Restrictions Precautions Precautions: Fall Restrictions Weight Bearing Restrictions: No      Mobility Bed Mobility               General bed mobility comments: pt in chair  Transfers Overall transfer level: Needs assistance Equipment used: Rolling walker (2 wheeled) Transfers: Sit to/from Stand Sit to Stand: Min assist              Balance Overall balance assessment: Needs assistance   Sitting balance-Leahy Scale: Fair       Standing balance-Leahy Scale: Poor                              ADL Overall ADL's : Needs assistance/impaired Eating/Feeding: Minimal assistance;Sitting Eating/Feeding Details (indicate cue type and reason): eating with L hand Grooming: Oral care;Moderate assistance;Sitting;Brushing hair   Upper Body Bathing: Moderate assistance;Sitting   Lower Body Bathing: Total assistance;Sit to/from stand   Upper Body Dressing : Sitting;Moderate assistance   Lower Body Dressing: Total assistance;Sit to/from stand   Toilet Transfer: Moderate assistance;Ambulation;RW;BSC   Toileting- Clothing Manipulation and  Hygiene: Minimal assistance;Sitting/lateral lean Toileting - Clothing Manipulation Details (indicate cue type and reason): pericare with L hand     Functional mobility during ADLs: Moderate assistance;Rolling walker;Cueing for safety;Cueing for sequencing General ADL Comments: assist for balance and to keep R hand on walker     Vision     Perception     Praxis      Pertinent Vitals/Pain Pain Assessment: No/denies pain     Hand Dominance Right   Extremity/Trunk Assessment Upper Extremity Assessment Upper Extremity Assessment: RUE deficits/detail RUE Deficits / Details: 3-/5 shoulder, 3+5 elbow, 3/5 forearm, 3-/5 wrist, 2-/5 hand RUE Coordination: decreased fine motor;decreased gross motor   Lower Extremity Assessment Lower Extremity Assessment: Defer to PT evaluation   Cervical / Trunk Assessment Cervical / Trunk Assessment: Normal   Communication Communication Communication: Expressive difficulties   Cognition Arousal/Alertness: Awake/alert Behavior During Therapy: WFL for tasks assessed/performed Overall Cognitive Status: Within Functional Limits for tasks assessed                     General Comments       Exercises       Shoulder Instructions      Home Living Family/patient expects to be discharged to:: Private residence Living Arrangements: Spouse/significant other Available Help at Discharge: Available 24 hours/day;Family Type of Home: House Home Access: Stairs to enter Secretary/administrator of Steps: 3 Entrance Stairs-Rails: Can reach both Home Layout: One level         Bathroom Toilet: Standard     Home  Equipment: None      Lives With: Spouse    Prior Functioning/Environment Level of Independence: Independent        Comments: 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         OT Problem List: Decreased strength;Decreased activity tolerance;Impaired balance (sitting and/or standing);Decreased  coordination;Decreased knowledge of use of DME or AE;Obesity;Impaired UE functional use   OT Treatment/Interventions: Self-care/ADL training;Neuromuscular education;Balance training;Patient/family education;Therapeutic activities;DME and/or AE instruction    OT Goals(Current goals can be found in the care plan section) Acute Rehab OT Goals Patient Stated Goal: to get better OT Goal Formulation: With patient Time For Goal Achievement: 03/12/16 Potential to Achieve Goals: Good ADL Goals Pt Will Perform Eating: with min assist;with adaptive utensils (with R UE) Pt Will Perform Grooming: with min assist;sitting Pt Will Perform Upper Body Bathing: with min assist;sitting Pt Will Perform Upper Body Dressing: with min assist;sitting Pt Will Transfer to Toilet: with min assist;ambulating;bedside commode (over toilet) Pt Will Perform Toileting - Clothing Manipulation and hygiene: with min assist;sit to/from stand Pt/caregiver will Perform Home Exercise Program: Increased ROM;Increased strength;Right Upper extremity;Independently  OT Frequency: Min 3X/week   Barriers to D/C:            Co-evaluation              End of Session Equipment Utilized During Treatment: Gait belt;Rolling walker  Activity Tolerance: Patient tolerated treatment well Patient left: in chair;with call bell/phone within reach;with family/visitor present   Time: [id number] OT Time Calculation (min): 16 min Charges:  OT General Charges $OT Visit: 1 Procedure OT Evaluation $OT Eval Moderate Complexity: 1 Procedure G-Codes:    Evern BioMayberry, Kirkland Figg Lynn 02/27/2016, 3:50 PM  [phone]

## 2016-02-27 NOTE — Plan of Care (Signed)
Problem: Education: Goal: Knowledge of secondary prevention will improve Outcome: Progressing She is stronger today than yesterday. Family is very encouraging,

## 2016-02-28 DIAGNOSIS — I6350 Cerebral infarction due to unspecified occlusion or stenosis of unspecified cerebral artery: Secondary | ICD-10-CM

## 2016-02-28 DIAGNOSIS — G8191 Hemiplegia, unspecified affecting right dominant side: Secondary | ICD-10-CM

## 2016-02-28 NOTE — Progress Notes (Signed)
Rehab admissions - I met with patient and her family.  I explained that rehab is full today with very limited bed availability for the next few days.  Patient and family are interested in [facility] closer to their home.  Case manager and social worker aware and will explore other options with patient.  Call me for questions.  #[phone]

## 2016-02-28 NOTE — Progress Notes (Signed)
PROGRESS NOTE    Kristen Barry  WUJ:[id number] DOB: 1941-03-17 DOA: 02/25/2016 PCP: Lenora BoysFRIED, ROBERT L, MD   Brief Narrative:  The patient is a very pleasant 75 yo Caucasian female with a PMH of HTN, Bells Palsy, Osteoarthritis, and other comorbid conditions who presented to [facility] with Right Sided Weakness. Symptoms started the day prior and patient developed Right Arm and Leg weakness, along with tingling in Right foot, as well as slurred speedh and a partial facial droop which started around 7 Am. She came [facility] for Evaluation and was worked up and had a CT Head and MRI/MRA. MRI showed Acute infarct of Left Paracentral Pons and she was admitted for further Evaluation and workup. Stroke Team was consult and patient worked with OT and PT who recommended CIR. Patient to be evaluated by Rehab Physician.   Assessment & Plan:   Principal Problem:   CVA (cerebral infarction) Active Problems:   Essential hypertension   Right sided weakness   Slurred speech   Obesity   Acute CVA (cerebrovascular accident) (HCC)  1. 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 Hypertension, stable 3. Hyperlipidemia 4. Osteoarthritis 5. GERD  PLAN  1. 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 Discussed Case with Dr. Roda ShuttersXu and patient was started on Plavix which she will continue -CT Head showed No acute intracranial abnormality. Patchy disease in the white matter probably represents chronic small vessel ischemic changes. -MRI/MRA of Head Acute infarct left paracentral pons. Moderate chronic microvascular ischemic change. Negative MRA head. -Carotid Dopplers done and pending read; Preliminary report showed 1-39% ICA plaquing.  Vertebral artery flow is antegrade. -Transthoracic Echocardiogram showed mild basal septal LV hypertrophy with LVEF 60-65%. Grade 1 diastolic  dysfunction. Upper normal left atrial chamber size. Mild mitral annular calcification. Mildly sclerotic aortic valve  with trivial aortic regurgitation. Trivial tricuspid regurgitation. -Lipid Panel showed Cholesterol of 213, TG of 137, HDL of 49, and LDL of 137, VLDL of 27 -HbA1c 5.5 -C/w Plavix 75 mg po Daily by Neurology -C/w Atorvastatin 40 mg -PT/OT/SLP Evaluate and treat; Falls Precautions - PT and OT recommend Inpatient Rehab -Lelon FrohlichEugenia Logue Rehab Admissions Coordinator evaluated patient and recommended Inpatient Rehab Consult -Dr. Ranelle OysterZachary T Swartz came to evaluate the patient and states she is a good candidate for CIR, however no current beds. Consulted Social [facility], and Spoke to Maralyn SagoSarah and she is working on alternative placement. Patient to remain in the hospital until Disposition decided pending Bed Availability. If SNF offers bed patient will be D/C'd to SNF when possible. -Neurochecks per protocol -D/C'd Telemetry -Allow for Permissive HTN  2. Hypertension -Allow for Permissive HTN of <220/120 -BP has been ranging from 135-142 SBP and 61-71 DBP -Will consider restarting Amlodipine 10 mg po Daily, and Metoprolol XL 100 mg po Daily in a few days when BP starts trending upwards.   3. Hyperlipidemia -Lipid Panel showed Cholesterol 213, TG of 137, HDL of 49, and LDL of 137, VLDL of 27 -Continue with Atorvastatin 40 mg po Daily  4 Osteoarthritis -Hold Celecoxib 200 mg po Daily; -C/w Tramadol 50 mg po q6hprn and Acteaminophen 650 mg po q4hprn  5. GERD -C/w Protonix 40 mg po Daily  DVT prophylaxis: Lovenox 40 mg po Daily; SCD's Code Status: Full Family Communication: Discussed with Patient's Daughter. She is in agreement with  the plan of care and all questions answered to patient's and families satisfaction.  Disposition Plan: Inpatient Rehab vs. SNF pending bed availability, whichever offers bed first.  Consultants:    -Neurology - Stroke Team   Procedures: None Antimicrobials: None  Subjective:  Patient was seen and examined this AM at bedside and she was hopeful to go to rehab. Denied Cp, SOB, N/V or Abdominal Pain. Still had some slurred speech but was doing better she though. Patient stated she felt a little weaker today in her Right side. No other complaints or concerns.     Objective: Vitals:   02/28/16 0100 02/28/16 0500 02/28/16 0941 02/28/16 1345  BP: (!) 155/74 139/71 (!) 142/65 135/61  Pulse: 78 68 77 95  Resp: 18 18 18 18   Temp: 99 F (37.2 C) 98.7 F (37.1 C) 97.5 F (36.4 C) 98.9 F (37.2 C)  TempSrc: Oral Oral Oral Oral  SpO2: 98% 99% 97% 96%    Intake/Output Summary (Last 24 hours) at 02/28/16 1552 Last data filed at 02/28/16 0830  Gross per 24 hour  Intake              360 ml  Output                0 ml  Net              360 ml   There were no vitals filed for this visit.  Examination: Physical Exam:  Constitutional: WN/WD obese, NAD and appears calm and comfortable sitting in chair Eyes:  Patient has had bilateral Cataract surgery and pupils are distorded, Right sided Lid Droop. sclerae anicteric  ENMT: External Ears, Nose appear normal. Grossly normal hearing. Mucous membranes are moist.  Normal dentition.  Neck: Appears normal, supple, no cervical masses, normal ROM, no appreciable thyromegaly, no JVD.  Respiratory: Clear to auscultation bilaterally, no wheezing, rales, rhonchi or crackles. Normal respiratory effort and patient is not tachypenic. No accessory muscle use.  Cardiovascular: RRR (rate 78), no murmurs / rubs / gallops. S1 and S2 auscultated. No extremity edema. 2+ pedal pulses. No carotid bruits.  Abdomen: Soft, non-tender, non-distended. No masses palpated. No appreciable hepatosplenomegaly. Bowel sounds positive x4.  GU: Deferred. Musculoskeletal: No clubbing / cyanosis of digits/nails. No joint deformity upper and lower extremities.No contractures.  Normal strength and muscle tone.  Skin: No rashes, lesions, ulcers. No induration; Warm and dry.  Neurologic: CN 2-12 not intact as patient has facial droop and lid droop (previous Bells Palsy likely). Strength 35 in Right Leg Compared to 5/5 In Left Leg. Romberg sign cerebellar reflexes not assessed. Speech is still dysarthric. Patient unable to raise Right Leg as high as yesterday.  Psychiatric: Normal judgment and insight. Alert and oriented x 3. Normal mood and appropriate and pleasant affect.   Data Reviewed: I have reviewed the following.   CBC:  Recent Labs Lab 02/25/16 0900 02/25/16 0909 02/26/16 0601 02/27/16 0637  WBC 7.8  --  7.3 6.8  NEUTROABS 5.9  --   --   --   HGB 15.0 15.6* 13.0 13.0  HCT 45.3 46.0 40.3 40.1  MCV 90.2  --  90.2 90.1  PLT 224  --  202 210   Basic Metabolic Panel:  Recent Labs Lab 02/25/16 0900 02/25/16 0909 02/26/16 0601 02/27/16 0637  NA 137 140 136 139  K 3.9 4.0 3.4* 4.0  CL 105 103 104 106  CO2 23  --  24 25  GLUCOSE 108* 110* 94  98  BUN 16 20 13 11   CREATININE 0.80 0.80 0.68 0.64  CALCIUM 9.8  --  9.2 9.4  MG  --   --   --  2.1  PHOS  --   --   --  3.1   GFR: CrCl cannot be calculated (Unknown ideal weight.). Liver Function Tests:  Recent Labs Lab 02/25/16 0900  AST 25  ALT 17  ALKPHOS 97  BILITOT 0.6  PROT 7.8  ALBUMIN 4.2   No results for input(s): LIPASE, AMYLASE in the last 168 hours. No results for input(s): AMMONIA in the last 168 hours. Coagulation Profile:  Recent Labs Lab 02/25/16 0900  INR 0.96   Cardiac Enzymes: No results for input(s): CKTOTAL, CKMB, CKMBINDEX, TROPONINI in the last 168 hours. BNP (last 3 results) No results for input(s): PROBNP in the last 8760 hours. HbA1C: No results for input(s): HGBA1C in the last 72 hours. CBG:  Recent Labs Lab 02/25/16 0845  GLUCAP 115*   Lipid Profile: No results for input(s): CHOL, HDL, LDLCALC, TRIG, CHOLHDL, LDLDIRECT in the last 72  hours. Thyroid Function Tests: No results for input(s): TSH, T4TOTAL, FREET4, T3FREE, THYROIDAB in the last 72 hours. Anemia Panel: No results for input(s): VITAMINB12, FOLATE, FERRITIN, TIBC, IRON, RETICCTPCT in the last 72 hours. Sepsis Labs: No results for input(s): PROCALCITON, LATICACIDVEN in the last 168 hours.  No results found for this or any previous visit (from the past 240 hour(s)).   Radiology Studies: No results found. Scheduled Meds: .  stroke: mapping our early stages of recovery book   Does not apply Once  . atorvastatin  40 mg Oral q1800  . clopidogrel  75 mg Oral Daily  . enoxaparin (LOVENOX) injection  40 mg Subcutaneous Q24H  . omega-3 acid ethyl esters  1 g Oral Daily  . pantoprazole  40 mg Oral Daily   Continuous Infusions: . sodium chloride 50 mL/hr at 02/26/16 2010    LOS: 3 days   Merlene Laughter, DO [facility] Pager [phone]  If 7PM-7AM, please contact night-coverage [email or web address] Password [email or web address] 02/28/2016, 3:52 PM

## 2016-02-28 NOTE — Consult Note (Signed)
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 right handed female with history significant for hypertension, Bell's palsy. Per chart review patient lives with spouse. Independent prior to admission. One level home with 5 steps to entry. Husband uses a cane and can provide only limited physical assistance. Presented 02/25/2016 with right sided weakness and slurred speech. CT/MRI showed acute infarct left paracentral pons. Moderate chronic microvascular ischemic changes. MRA of the head negative. Patient did not receive TPA. Carotid Dopplers but no ICA stenosis. Echocardiogram with ejection fraction of 65% no wall motion abnormalities. Neurology services consulted maintained on Plavix for CVA prophylaxis. Subcutaneous Lovenox for DVT prophylaxis. Tolerating a regular diet. Physical and occupational therapy evaluations completed. Recommendations for physical medicine rehabilitation consult.   Review of Systems  Constitutional: Negative for chills and fever.  HENT: Negative for hearing loss.   Eyes: Negative for blurred vision and double vision.  Respiratory: Negative for cough and shortness of breath.   Cardiovascular: Negative for chest pain, palpitations and leg swelling.  Gastrointestinal: Positive for constipation. Negative for nausea and vomiting.       GERD  Genitourinary: Negative for dysuria, flank pain and hematuria.  Musculoskeletal: Positive for joint pain and myalgias.  Skin: Negative for itching and rash.  Neurological: Positive for speech change and weakness. Negative for headaches.  All other systems reviewed and are negative.  Past Medical History:  Diagnosis Date  . CTS (carpal tunnel syndrome)   . Diverticulosis   . H/O Bell's palsy   . Hypertension   . Laryngopharyngeal reflux   . OA (osteoarthritis)   . Osteopenia   . Right  sided weakness   . Seasonal allergies    History reviewed. No pertinent surgical history. Family History  Problem Relation Age of Onset  . CVA Mother   . Diabetes Mother   . Osteoarthritis Mother   . Hypertension Mother   . CAD Father   . Heart Problems Brother     stent  . Heart attack Brother     2-3 caths  . Melanoma Paternal Aunt    Social History:  reports that she has quit smoking. She has never used smokeless tobacco. She reports that she drinks alcohol. She reports that she does not use drugs. Allergies:  Allergies  Allergen Reactions  . Ace Inhibitors     Cough   . Codeine     Stomach upset    Medications Prior to Admission  Medication Sig Dispense Refill  . amLODipine (NORVASC) 10 MG tablet Take 10 mg by mouth daily.    [name] aspirin 81 MG tablet Take 81 mg by mouth daily.    . Calcium Carbonate-Vit D-Min (CALCIUM 1200 PO) Take by mouth daily.    . celecoxib (CELEBREX) 200 MG capsule Take 200 mg by mouth daily.    . cholecalciferol (VITAMIN D) 1000 UNITS tablet Take 1,000 Units by mouth daily.    . metoprolol succinate (TOPROL-XL) 100 MG 24 hr tablet Take 100 mg by mouth daily. Take with or immediately following a meal.    . Omega-3 Fatty Acids (FISH OIL) 1000 MG CAPS Take by mouth daily.    [name] omeprazole (PRILOSEC) 40 MG capsule Take 40 mg by mouth daily.    . traMADol (ULTRAM) 50 MG tablet Take 1 tablet (50 mg total) by mouth every 6 (six) hours as needed. 15  tablet 0  . vitamin C (ASCORBIC ACID) 500 MG tablet Take 500 mg by mouth daily.    . metoprolol (TOPROL-XL) 200 MG 24 hr tablet Take 100 mg by mouth daily.      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  Functional Status:  Mobility: Bed Mobility Overal bed mobility: Modified Independent General bed mobility comments: pt in chair Transfers Overall transfer level: Needs assistance Equipment used: Rolling walker (2 wheeled) Transfers: Sit to/from Stand Sit to Stand: Min assist Stand pivot transfers: Min assist Ambulation/Gait Ambulation/Gait assistance: Mod assist Ambulation Distance (Feet): 8 Feet (performed twice) Assistive device: 1 person hand held assist (pt held to PT's arm) Gait Pattern/deviations: Step-to pattern, Decreased stride length General Gait Details: pt with decreased coordination in RLE when stepping and abnormal gait pattern.  Gait velocity interpretation: <1.8 ft/sec, indicative of risk for recurrent falls    ADL: ADL Overall ADL's : Needs assistance/impaired Eating/Feeding: Minimal assistance, Sitting Eating/Feeding Details (indicate cue type and reason): eating with L hand Grooming: Oral care, Moderate assistance, Sitting, Brushing hair Upper Body Bathing: Moderate assistance, Sitting Lower Body Bathing: Total assistance, Sit to/from stand Upper Body Dressing : Sitting, Moderate assistance Lower Body Dressing: Total assistance, Sit to/from stand Toilet Transfer: Moderate assistance, Ambulation, RW, BSC Toileting- Clothing Manipulation and Hygiene: Minimal assistance, Sitting/lateral lean Toileting - Clothing Manipulation Details (indicate cue type and reason): pericare with L hand Functional mobility during ADLs: Moderate assistance, Rolling walker, Cueing for safety, Cueing for sequencing General ADL Comments: assist for balance and to keep R hand on walker  Cognition: Cognition Overall Cognitive Status: Within Functional Limits for tasks  assessed Arousal/Alertness: Awake/alert Orientation Level: Oriented X4 Cognition Arousal/Alertness: Awake/alert Behavior During Therapy: WFL for tasks assessed/performed Overall Cognitive Status: Within Functional Limits for tasks assessed  Blood pressure 139/71, pulse 68, temperature 98.7 F (37.1 C), temperature source Oral, resp. rate 18, SpO2 99 %. Physical Exam  Constitutional: She is oriented to person, place, and time. She appears well-developed.  HENT:  Head: Normocephalic.  Eyes: EOM are normal.  Neck: Normal range of motion. Neck supple. No thyromegaly present.  Cardiovascular: Normal rate and regular rhythm.   Respiratory: Effort normal and breath sounds normal. No respiratory distress.  GI: Soft. Bowel sounds are normal. She exhibits no distension.  Neurological: She is alert and oriented to person, place, and time.  Speech is dysarthric but intelligible.right facial weakness. Cognition appears appropriate. RUE 3- to 3/5 prox to distal. RLE: 2- HF, 2 KE and 2 ADF/PF. Senses pain and light touch. LUE and LLE 5/5.  Skin: Skin is warm and dry.  Psychiatric: She has a normal mood and affect. Her behavior is normal. Thought content normal.    Results for orders placed or performed during the hospital encounter of 02/25/16 (from the past 24 hour(s))  Basic metabolic panel     Status: None   Collection Time: 02/27/16  6:37 AM  Result Value Ref Range   Sodium 139 135 - 145 mmol/L   Potassium 4.0 3.5 - 5.1 mmol/L   Chloride 106 101 - 111 mmol/L  CO2 25 22 - 32 mmol/L   Glucose, Bld 98 65 - 99 mg/dL   BUN 11 6 - 20 mg/dL   Creatinine, Ser 0.980.64 0.44 - 1.00 mg/dL   Calcium 9.4 8.9 - 11.910.3 mg/dL   GFR calc non Af Amer >60 >60 mL/min   GFR calc Af Amer >60 >60 mL/min   Anion gap 8 5 - 15  Magnesium     Status: None   Collection Time: 02/27/16  6:37 AM  Result Value Ref Range   Magnesium 2.1 1.7 - 2.4 mg/dL  Phosphorus     Status: None   Collection Time: 02/27/16  6:37 AM   Result Value Ref Range   Phosphorus 3.1 2.5 - 4.6 mg/dL  CBC     Status: None   Collection Time: 02/27/16  6:37 AM  Result Value Ref Range   WBC 6.8 4.0 - 10.5 K/uL   RBC 4.45 3.87 - 5.11 MIL/uL   Hemoglobin 13.0 12.0 - 15.0 g/dL   HCT 14.740.1 82.936.0 - 56.246.0 %   MCV 90.1 78.0 - 100.0 fL   MCH 29.2 26.0 - 34.0 pg   MCHC 32.4 30.0 - 36.0 g/dL   RDW 13.012.6 86.511.5 - 78.415.5 %   Platelets 210 150 - 400 K/uL   No results found.  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 Does the need for close, 24 hr/day medical supervision in concert with the patient's rehab needs make it unreasonable for this patient to be served in a less intensive setting? Yes 2. Co-Morbidities requiring supervision/potential complications: htn, obesity 3. Due to bladder management, bowel management, safety, skin/wound care, disease management, medication administration, pain management and patient education, does the patient require 24 hr/day rehab nursing? Yes 4. Does the patient require coordinated care of a physician, rehab nurse, PT (1-2 hrs/day, 5 days/week), OT (1-2 hrs/day, 5 days/week) and SLP (1-2 hrs/day, 5 days/week) to address physical and functional deficits in the context of the above medical diagnosis(es)? Yes Addressing deficits in the following areas: balance, endurance, locomotion, strength, transferring, bowel/bladder control, bathing, dressing, feeding, grooming, toileting, speech, swallowing and psychosocial support 5. Can the patient actively participate in an intensive therapy program of at least 3 hrs of therapy per day at least 5 days per week? Yes 6. The potential for patient to make measurable gains while on inpatient rehab is excellent 7. Anticipated functional outcomes upon discharge from inpatient rehab are modified independent  with PT, modified independent with OT, modified independent with SLP. 8. Estimated rehab length of stay to reach the above  functional goals is: 10-14 days 9. Does the patient have adequate social supports and living environment to accommodate these discharge functional goals? Yes 10. Anticipated D/C setting: Home 11. Anticipated post D/C treatments: HH therapy and Outpatient therapy 12. Overall Rehab/Functional Prognosis: excellent  RECOMMENDATIONS: This patient's condition is appropriate for continued rehabilitative care in the following setting: CIR Patient has agreed to participate in recommended program. Yes Note that insurance prior authorization may be required for reimbursement for recommended care.  Comment: Rehab Admissions Coordinator to follow up. Pt has 4 steps to enter home.  Thanks,  Ranelle OysterZachary T. Kolbey Teichert, MD, [place] DomFAAPMR     02/28/2016

## 2016-02-28 NOTE — Care Management Note (Signed)
Case Management Note  Patient Details  Name: Kristen Barry MRN: [id number] Date of Birth: 10-05-1940  Subjective/Objective:    Pt admitted with CVA. She is from home with her spouse.                 Action/Plan: Recommendations are for CIR. No CIR beds available today and most likely tomorrow. CM and CSW met with the patient and her family and asked about Inpatient rehab in [facility] and SNF rehab. Family and patient agreeable to either. CM faxed patients information to [place] [facility] and CSW going to fax her to SNF. CM will follow up tomorrow.   Expected Discharge Date:                  Expected Discharge Plan:  [place]  In-House Referral:     Discharge planning Services  CM Consult  Post Acute Care Choice:    Choice offered to:     DME Arranged:    DME Agency:     HH Arranged:    [place] Agency:     Status of Service:  In process, will continue to follow  If discussed at Long Length of Stay Meetings, dates discussed:    Additional Comments:  Pollie Friar, RN 02/28/2016, 5:05 PM

## 2016-02-28 NOTE — Progress Notes (Signed)
Physical Therapy Treatment Patient Details Name: Kristen Barry MRN: [id number] DOB: Jun 07, 1940 Today's Date: 02/28/2016    History of Present Illness Admitted with an acute stroke involving the L MCA territory.  PMH significant for: Bell's Palsy, HTN, OA, osteopenia, carpal tunnel    PT Comments    Progressing well.  Emphasized gait training and transfers with RW, then cane working to moderate the circumduction and progressing toward more heel/toe pattern.  Would benefit from the intensity of an inpatient program.  Follow Up Recommendations  CIR     Equipment Recommendations  None recommended by PT    Recommendations for Other Services       Precautions / Restrictions Precautions Precautions: Fall    Mobility  Bed Mobility               General bed mobility comments: pt in chair  Transfers Overall transfer level: Needs assistance Equipment used: Rolling walker (2 wheeled);Straight cane;None Transfers: Sit to/from Stand Sit to Stand: Min assist Stand pivot transfers:  (5-6 trials throughout session)       General transfer comment: cues for hand placement and technque  Ambulation/Gait Ambulation/Gait assistance: Min assist Ambulation Distance (Feet): 200 Feet Assistive device: Rolling walker (2 wheeled);Straight cane (with min assist) Gait Pattern/deviations: Step-through pattern   Gait velocity interpretation: Below normal speed for age/gender General Gait Details: Stable hemiparetic gait. Some inversion and circumduction at first, then worked on improved heel toe.  Half the distance with SPC to work on dissociation of trunk.  Worked in the room, standing walking out forward 5 feet and backward picking R foot up and stepping back, all with cane x 2 trials.   Stairs            Wheelchair Mobility    Modified Rankin (Stroke Patients Only) Modified Rankin (Stroke Patients Only) Pre-Morbid Rankin Score: No symptoms Modified Rankin: Moderately severe  disability     Balance     Sitting balance-Leahy Scale: Good       Standing balance-Leahy Scale: Fair                      Cognition Arousal/Alertness: Awake/alert Behavior During Therapy: WFL for tasks assessed/performed Overall Cognitive Status: Within Functional Limits for tasks assessed                      Exercises      General Comments        Pertinent Vitals/Pain Pain Assessment: No/denies pain    Home Living                      Prior Function            PT Goals (current goals can now be found in the care plan section) Acute Rehab PT Goals Patient Stated Goal: to get better PT Goal Formulation: With patient Time For Goal Achievement: 03/04/16 Potential to Achieve Goals: Good Progress towards PT goals: Progressing toward goals    Frequency    Min 4X/week      PT Plan Current plan remains appropriate    Co-evaluation             End of Session   Activity Tolerance: Patient tolerated treatment well Patient left: in chair;with call bell/phone within reach;with family/visitor present     Time: 1725-1741 PT Time Calculation (min) (ACUTE ONLY): 16 min  Charges:  $Gait Training: 8-22 mins  G Codes:      Ryosuke Ericksen, Eliseo Gum 02/28/2016, 5:53 PM 02/28/2016   Bing, PT [phone]  (pager)

## 2016-02-29 NOTE — Clinical Social Work Note (Signed)
Clinical Social Work Assessment  Patient Details  Name: Kristen Barry MRN: [id number] Date of Birth: 12-18-1940  Date of referral:  02/28/16               Reason for consult:  Facility Placement                Permission sought to share information with:  Family Supports Permission granted to share information::  Yes, Verbal Permission Granted  Name::     Renelle Stegenga  Relationship::  spouse  Contact Information:  [phone]  Housing/Transportation Living arrangements for the past 2 months:  Single Family Home Source of Information:  Patient, Adult Children, Spouse, Other (Comment Required) Patient Interpreter Needed:  None Criminal Activity/Legal Involvement Pertinent to Current Situation/Hospitalization:  No - Comment as needed Significant Relationships:  Adult Children, Other Family Members, Spouse Lives with:  Spouse Do you feel safe going back to the place where you live?  Yes Need for family participation in patient care:  Yes (Comment)  Care giving concerns:  No care giving concerns identified.   Social Worker assessment / plan:  CSW met with pt and family to address consult for New SNF. PT is recommending CIR and SNF would be a back up. CSW introduced herself and explained role of social work. CSW also explained process of discharging to SNF. Pt is interested in CIR primarily, which RNCM is following for. CSW will initiated SNF search and follow up with bed offers. CSW will continue to follow.   Employment status:  Retired Nurse, adult PT Recommendations:  Inpatient [place] / Referral to community resources:  [place]  Patient/Family's Response to care:  Pt was appreciative of CSW support.   Patient/Family's Understanding of and Emotional Response to Diagnosis, Current Treatment, and Prognosis:  Pt understands that she needs rehab prior to returning home, and prefers CIR over SNF.   Emotional  Assessment Appearance:  Appears stated age Attitude/Demeanor/Rapport:   (Appropriate) Affect (typically observed):  Accepting, Adaptable, Pleasant Orientation:  Oriented to Self, Oriented to Place, Oriented to  Time, Oriented to Situation Alcohol / Substance use:  Never Used Psych involvement (Current and /or in the community):  No (Comment)  Discharge Needs  Concerns to be addressed:  Adjustment to Illness Readmission within the last 30 days:  No Current discharge risk:  None Barriers to Discharge:  Ship broker, Continued Medical Work up   [employer], LCSW 02/29/2016, 3:55 PM

## 2016-02-29 NOTE — Care Management Important Message (Signed)
Important Message  Patient Details  Name: Kristen Barry MRN: [id number] Date of Birth: 1940-08-30   Medicare Important Message Given:  Yes    [name] 02/29/2016, 11:48 AM

## 2016-02-29 NOTE — NC FL2 (Signed)
[place] MEDICAID FL2 LEVEL OF CARE SCREENING TOOL     IDENTIFICATION  Patient Name: Kristen Barry Birthdate: December 29, 1940 Sex: female Admission Date (Current Location): 02/25/2016  [facility] and [place] Number:  Producer, television/film/video and Address:  [facility], [address], [place], [place] [address]      Provider Number: [id number]  Attending Physician Name and Address:  Merlene Laughter, DO  Relative Name and Phone Number:       Current Level of Care: Hospital Recommended Level of Care: Skilled Nursing Facility Prior Approval Number:    Date Approved/Denied: 02/29/16 PASRR Number: [id number]  Discharge Plan: SNF    Current Diagnoses: Patient Active Problem List   Diagnosis Date Noted  . Acute CVA (cerebrovascular accident) (HCC)   . Right sided weakness 02/25/2016  . Slurred speech 02/25/2016  . CVA (cerebral infarction) 02/25/2016  . Obesity 02/25/2016  . Essential hypertension 07/10/2013    Orientation RESPIRATION BLADDER Height & Weight     Self, Time, Situation, Place  Normal Continent Weight:   Height:     BEHAVIORAL SYMPTOMS/MOOD NEUROLOGICAL BOWEL NUTRITION STATUS      Continent Diet (Heart Healthy: thin liquids)  AMBULATORY STATUS COMMUNICATION OF NEEDS Skin   Limited Assist Verbally                         Personal Care Assistance Level of Assistance  Bathing, Feeding, Dressing Bathing Assistance: Limited assistance Feeding assistance: Independent Dressing Assistance: Limited assistance     Functional Limitations Info  Sight, Hearing, Speech Sight Info: Adequate Hearing Info: Adequate Speech Info: Adequate    SPECIAL CARE FACTORS FREQUENCY  PT (By licensed PT), OT (By licensed OT)     PT Frequency: 3x week OT Frequency: 3x week            Contractures Contractures Info: Not present    Additional Factors Info  Code Status, Allergies Code Status Info: Full Allergies Info: Ace Inhibitors,  Codeine           Current Medications (02/29/2016):  This is the current hospital active medication list Current Facility-Administered Medications  Medication Dose Route Frequency Provider Last Rate Last Dose  .  stroke: mapping our early stages of recovery book   Does not apply Once Gwenyth Bender, NP      . 0.9 %  sodium chloride infusion   Intravenous Continuous Gwenyth Bender, NP 50 mL/hr at 02/26/16 2010    . acetaminophen (TYLENOL) tablet 650 mg  650 mg Oral Q4H PRN Gwenyth Bender, NP   650 mg at 02/28/16 1705   Or  . acetaminophen (TYLENOL) suppository 650 mg  650 mg Rectal Q4H PRN Gwenyth Bender, NP      . atorvastatin (LIPITOR) tablet 40 mg  40 mg Oral q1800 Marvel Plan, MD   40 mg at 02/28/16 1705  . clopidogrel (PLAVIX) tablet 75 mg  75 mg Oral Daily Marvel Plan, MD   75 mg at 02/29/16 0815  . enoxaparin (LOVENOX) injection 40 mg  40 mg Subcutaneous Q24H Gwenyth Bender, NP   40 mg at 02/28/16 1705  . omega-3 acid ethyl esters (LOVAZA) capsule 1 g  1 g Oral Daily [facility] [place], DO   1 g at 02/29/16 0815  . pantoprazole (PROTONIX) EC tablet 40 mg  40 mg Oral Daily [facility], DO   40 mg at 02/29/16 0815  . senna-docusate (Senokot-S)  tablet 1 tablet  1 tablet Oral QHS PRN Gwenyth BenderKaren M Black, NP   1 tablet at 02/27/16 1011  . traMADol (ULTRAM) tablet 50 mg  50 mg Oral Q6H PRN Heloise Beechammair Latif Sheikh, DO   50 mg at 02/26/16 1249  . zolpidem (AMBIEN) tablet 5 mg  5 mg Oral QHS PRN Pearson GrippeJames Kim, MD   5 mg at 02/28/16 2124     Discharge Medications: Please see discharge summary for a list of discharge medications.  Relevant Imaging Results:  Relevant Lab Results:   Additional Information SSN: [id number]  [place] AmericanBridget A Mayton, LCSW

## 2016-02-29 NOTE — Progress Notes (Signed)
Physical Therapy Treatment Patient Details Name: Kristen Barry MRN: [id number] DOB: Dec 17, 1940 Today's Date: 02/29/2016    History of Present Illness Admitted with an acute stroke involving the L MCA territory.  PMH significant for: Bell's Palsy, HTN, OA, osteopenia, carpal tunnel    PT Comments    Progressing steadily.  Emphasis on gait training to decrease hemiparetic nature of her gait and improve heel/toe pattern  Addition of cane to work on balance challenges and better movement of upper body.  Follow Up Recommendations  CIR     Equipment Recommendations  None recommended by PT    Recommendations for Other Services       Precautions / Restrictions Precautions Precautions: Fall    Mobility  Bed Mobility               General bed mobility comments: pt in chair  Transfers Overall transfer level: Needs assistance Equipment used: Rolling walker (2 wheeled);Straight cane Transfers: Sit to/from Stand Sit to Stand: Min assist         General transfer comment: cues for hand placement with support to weight bear through RUE for sit<>stand and technique  Ambulation/Gait Ambulation/Gait assistance: Min assist Ambulation Distance (Feet): 200 Feet Assistive device: Rolling walker (2 wheeled);Straight cane Gait Pattern/deviations: Step-through pattern   Gait velocity interpretation: Below normal speed for age/gender General Gait Details: Continued to work on heel/toe pattern instead of circumduction which is her default when she is not focusing.  Generally hemiparetic.   Stairs            Wheelchair Mobility    Modified Rankin (Stroke Patients Only) Modified Rankin (Stroke Patients Only) Pre-Morbid Rankin Score: No symptoms Modified Rankin: Moderately severe disability     Balance Overall balance assessment: Needs assistance Sitting-balance support: No upper extremity supported;Feet supported Sitting balance-Leahy Scale: Good     Standing  balance support: Bilateral upper extremity supported;Single extremity supported Standing balance-Leahy Scale: Fair Standing balance comment: static standing only                    Cognition Arousal/Alertness: Awake/alert Behavior During Therapy: WFL for tasks assessed/performed Overall Cognitive Status: Within Functional Limits for tasks assessed                      Exercises      General Comments General comments (skin integrity, edema, etc.): husband present today      Pertinent Vitals/Pain Pain Assessment: No/denies pain    Home Living                      Prior Function            PT Goals (current goals can now be found in the care plan section) Acute Rehab PT Goals Patient Stated Goal: to get better PT Goal Formulation: With patient Time For Goal Achievement: 03/04/16 Potential to Achieve Goals: Good Progress towards PT goals: Progressing toward goals    Frequency    Min 4X/week      PT Plan Current plan remains appropriate    Co-evaluation             End of Session Equipment Utilized During Treatment: Gait belt Activity Tolerance: Patient tolerated treatment well Patient left: in chair;with call bell/phone within reach;with family/visitor present     Time: [id number] PT Time Calculation (min) (ACUTE ONLY): 18 min  Charges:  $Gait Training: 8-22 mins  G Codes:      Torey Regan, Eliseo Gum 02/29/2016, 4:55 PM 02/29/2016  Carthage Bing, PT [phone]  (pager)

## 2016-02-29 NOTE — Progress Notes (Signed)
PROGRESS NOTE    Kristen Barry  ZOX:[id number] DOB: 11-10-1940 DOA: 02/25/2016 PCP: Lenora Boys, MD   Brief Narrative:  The patient is a very pleasant 75 yo Caucasian female with a PMH of HTN, Bells Palsy, Osteoarthritis, and other comorbid conditions who presented to [facility] with Right Sided Weakness. Symptoms started the day prior and patient developed Right Arm and Leg weakness, along with tingling in Right foot, as well as slurred speedh and a partial facial droop which started around 7 Am. She came to the [facility] ED for Evaluation and was worked up and had a CT Head and MRI/MRA. MRI showed Acute infarct of Left Paracentral Pons and she was admitted for further Evaluation and workup. Stroke Team was consult and patient worked with OT and PT who recommended CIR. Patient was to be D/C'd to SNF today but patient wants to try to go to Inpatient Rehab at [facility], and patient declined SNF placement today. Medically deemed stable currently.   Assessment & Plan:   Principal Problem:   CVA (cerebral infarction) Active Problems:   Essential hypertension   Right sided weakness   Slurred speech   Obesity   Acute CVA (cerebrovascular accident) (HCC)  1. 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 Hypertension, stable 3. Hyperlipidemia 4. Osteoarthritis 5. GERD  PLAN  1. 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 Discussed Case with Dr. Roda Shutters and patient was started on Plavix which she will continue -CT Head showed No acute intracranial abnormality. Patchy disease in the white matter probably represents chronic small vessel ischemic changes. -MRI/MRA of Head Acute infarct left paracentral pons. Moderate chronic microvascular ischemic change. Negative MRA head. -Carotid Dopplers done and pending read; Preliminary report showed 1-39% ICA plaquing.   Vertebral artery flow is antegrade. -Transthoracic Echocardiogram showed mild basal septal LV hypertrophy with LVEF 60-65%. Grade 1 diastolic dysfunction. Upper normal left atrial chamber size. Mild mitral annular calcification. Mildly sclerotic aortic valve  with trivial aortic regurgitation. Trivial tricuspid regurgitation. -Lipid Panel showed Cholesterol of 213, TG of 137, HDL of 49, and LDL of 137, VLDL of 27 -HbA1c 5.5 -C/w Plavix 75 mg and Atorvastatin 40 mg po Daily by Neurology -PT/OT/SLP Evaluate and treat; Falls Precautions - PT and OT recommend Inpatient Rehab -Lelon Frohlich Rehab Admissions Coordinator evaluated patient and recommended Inpatient Rehab Consult;-Dr. Ranelle Oyster came to evaluate the patient and states she is a good candidate for CIR, however no current beds at [facility]. -Neurochecks per protocol -Allow for Permissive HTN  **Consulted Social Work Dede Query for Back Up SNF placement and patient offered a bed at [facility] but patient declined SNF placement and wants to try Inpatient Rehab at [facility]. Case Worker discussed that insurance may not pay for [facility] Stay as she is Medically Stable to be D/C'd to SNF and patient and family Verbally Understood and Agreed.**    2. Hypertension -Allow for Permissive HTN of <220/120 -BP has been ranging from 124-141 SBP and 60-70 DBP -Will consider restarting Amlodipine 10 mg po Daily, and Metoprolol XL 100 mg po Daily in a few days when BP starts trending upwards.   3. Hyperlipidemia -Lipid Panel showed Cholesterol 213, TG of 137, HDL of 49, and LDL of 137, VLDL of 27 -Continue with Atorvastatin 40 mg po Daily  4 Osteoarthritis -Hold Celecoxib 200 mg po Daily; -C/w Tramadol 50 mg  po q6hprn and Acteaminophen 650 mg po q4hprn  5. GERD -C/w Protonix 40 mg po Daily  DVT prophylaxis: Lovenox 40 mg po Daily; SCD's Code Status: Full Family Communication: Discussed with Patient's Daughter. She is in  agreement with the plan of care and all questions answered to patient's and families satisfaction. Patient and family want Inpatient Rehab at [facility] and Declined SNF placement.   Disposition Plan: Inpatient Rehab pending bed availabilty vs. SNF.  Consultants:   -Neurology - Stroke Team   Procedures: None Antimicrobials: None  Subjective:  Patient was seen and examined this AM at bedside and she was hopeful to go to an Inpatient Rehab. Denied Cp, SOB, N/V or Abdominal Pain. No other complaints or concerns as family declined SNF placement.     Objective: Vitals:   02/29/16 0131 02/29/16 0509 02/29/16 0934 02/29/16 1346  BP: 126/60 124/60 136/70 (!) 141/69  Pulse: 74 68 88 84  Resp: 18 18 18 18   Temp: 97.9 F (36.6 C) 97.6 F (36.4 C) 98.5 F (36.9 C) 98.2 F (36.8 C)  TempSrc: Oral Oral Oral Oral  SpO2: 96% 98% 96% 95%    Intake/Output Summary (Last 24 hours) at 02/29/16 1626 Last data filed at 02/29/16 1300  Gross per 24 hour  Intake              480 ml  Output                0 ml  Net              480 ml   There were no vitals filed for this visit.  Examination: Physical Exam:  Constitutional: WN/WD obese, NAD and appears calm and comfortable sitting in chair Eyes:  Patient has had bilateral Cataract surgery and pupils are distorded, Right sided Lid Droop. sclerae anicteric  ENMT: External Ears, Nose appear normal. Grossly normal hearing. Mucous membranes are moist.  Normal dentition.  Neck: Appears normal, supple, no cervical masses, normal ROM, no appreciable thyromegaly, no JVD.  Respiratory: Clear to auscultation bilaterally, no wheezing, rales, rhonchi or crackles. Normal respiratory effort and patient is not tachypenic. No accessory muscle use.  Cardiovascular: RRR, no murmurs / rubs / gallops. S1 and S2 auscultated. No extremity edema. 2+ pedal pulses.  Abdomen: Soft, non-tender, non-distended. No masses palpated. No appreciable hepatosplenomegaly.  Bowel sounds positive x4.  GU: Deferred. Musculoskeletal: No clubbing / cyanosis of digits/nails. No joint deformity upper and lower extremities.No contractures. Normal strength and muscle tone.  Skin: No rashes, lesions, ulcers. No induration; Warm and dry.  Neurologic: CN 2-12 not intact as patient has facial droop and lid droop (previous Bells Palsy likely). Strength 3/5 in Right Leg Compared to 5/5 In Left Leg. Romberg sign cerebellar reflexes not assessed. Speech is still dysarthric. Patient slowly improving.  Psychiatric: Normal judgment and insight. Alert and oriented x 3. Normal mood and appropriate and pleasant affect.   Data Reviewed: I have reviewed the following.   CBC:  Recent Labs Lab 02/25/16 0900 02/25/16 0909 02/26/16 0601 02/27/16 0637  WBC 7.8  --  7.3 6.8  NEUTROABS 5.9  --   --   --   HGB 15.0 15.6* 13.0 13.0  HCT 45.3 46.0 40.3 40.1  MCV 90.2  --  90.2 90.1  PLT 224  --  202 210   Basic Metabolic Panel:  Recent Labs Lab 02/25/16 0900 02/25/16 0909 02/26/16 0601 02/27/16 0637  NA 137 140 136 139  K 3.9 4.0  3.4* 4.0  CL 105 103 104 106  CO2 23  --  24 25  GLUCOSE 108* 110* 94 98  BUN 16 20 13 11   CREATININE 0.80 0.80 0.68 0.64  CALCIUM 9.8  --  9.2 9.4  MG  --   --   --  2.1  PHOS  --   --   --  3.1   GFR: CrCl cannot be calculated (Unknown ideal weight.). Liver Function Tests:  Recent Labs Lab 02/25/16 0900  AST 25  ALT 17  ALKPHOS 97  BILITOT 0.6  PROT 7.8  ALBUMIN 4.2   No results for input(s): LIPASE, AMYLASE in the last 168 hours. No results for input(s): AMMONIA in the last 168 hours. Coagulation Profile:  Recent Labs Lab 02/25/16 0900  INR 0.96   Cardiac Enzymes: No results for input(s): CKTOTAL, CKMB, CKMBINDEX, TROPONINI in the last 168 hours. BNP (last 3 results) No results for input(s): PROBNP in the last 8760 hours. HbA1C: No results for input(s): HGBA1C in the last 72 hours. CBG:  Recent Labs Lab  02/25/16 0845  GLUCAP 115*   Lipid Profile: No results for input(s): CHOL, HDL, LDLCALC, TRIG, CHOLHDL, LDLDIRECT in the last 72 hours. Thyroid Function Tests: No results for input(s): TSH, T4TOTAL, FREET4, T3FREE, THYROIDAB in the last 72 hours. Anemia Panel: No results for input(s): VITAMINB12, FOLATE, FERRITIN, TIBC, IRON, RETICCTPCT in the last 72 hours. Sepsis Labs: No results for input(s): PROCALCITON, LATICACIDVEN in the last 168 hours.  No results found for this or any previous visit (from the past 240 hour(s)).   Radiology Studies: No results found. Scheduled Meds: .  stroke: mapping our early stages of recovery book   Does not apply Once  . atorvastatin  40 mg Oral q1800  . clopidogrel  75 mg Oral Daily  . enoxaparin (LOVENOX) injection  40 mg Subcutaneous Q24H  . omega-3 acid ethyl esters  1 g Oral Daily  . pantoprazole  40 mg Oral Daily   Continuous Infusions: . sodium chloride 50 mL/hr at 02/26/16 2010    LOS: 4 days   Merlene Laughtermair Latif Sheikh, DO [facility] Pager [phone]  If 7PM-7AM, please contact night-coverage [email or web address] Password [email or web address] 02/29/2016, 4:26 PM

## 2016-02-29 NOTE — Clinical Social Work Note (Signed)
CSW and MD met with pt to address dispo. CSW presented bed offers, however pt declined SNF placement and would like to explore CIR at [facility] further. RNCM notified. CSW will continue to follow.   Darden Dates, MSW, LCSW Clinical Social Worker  [phone]

## 2016-02-29 NOTE — Progress Notes (Signed)
Occupational Therapy Treatment Patient Details Name: Kristen Barry MRN: [name] DOB: 12/25/1940 Today's Date: 02/29/2016    History of present illness Admitted with an acute stroke involving the L MCA territory.  PMH significant for: Bell's Palsy, HTN, OA, osteopenia, carpal tunnel   OT comments  This 75 yo female admitted with above presents to acute OT making progress with basic ADLs and use of RUE. She will continue to benefit from acute OT with follow up at a comprehensive inpatient rehab center. She has the ability to get to a Mod I level.  Follow Up Recommendations  CIR;Supervision/Assistance - 24 hour    Equipment Recommendations  Other (comment) (TBD next venue)       Precautions / Restrictions Precautions Precautions: Fall Restrictions Weight Bearing Restrictions: No       Mobility Bed Mobility               General bed mobility comments: pt in chair  Transfers Overall transfer level: Needs assistance Equipment used: Rolling walker (2 wheeled) Transfers: Sit to/from Stand Sit to Stand: Min assist         General transfer comment: cues for hand placement with support to weight bear through RUE for sit<>stand and technique    Balance Overall balance assessment: Needs assistance Sitting-balance support: No upper extremity supported;Feet supported Sitting balance-Leahy Scale: Good     Standing balance support: No upper extremity supported Standing balance-Leahy Scale: Fair                     ADL Overall ADL's : Needs assistance/impaired     Grooming: Wash/dry hands;Min guard;Standing Grooming Details (indicate cue type and reason): Cues to use right hand more               Lower Body Dressing Details (indicate cue type and reason): Can cross her LLE over her RLE, attempts to use RUE to doff sock, but cannot quite do this completely yet (if you take if off of her heel for her she then can remove it the rest of the way) Toilet  Transfer: Minimal assistance;Ambulation;RW;BSC (over toilet)   Toileting- Clothing Manipulation and Hygiene: Moderate assistance (for getting underwear up and down)                   Perception     Praxis      Cognition   Behavior During Therapy: [facility] for tasks assessed/performed Overall Cognitive Status: Within Functional Limits for tasks assessed                         Exercises Other Exercises Other Exercises: Had pt perform 5 reps of resistive shoulder flexion with me (my palm to hers) and 5 reps of resistive elbow extension (my palm to hers). Had pt work on shifting an item from one hand to another, reaching up to touch her nose-hair-behind her neck--behind her back. Issued pt a tan theraputty for her to work on squeezing and flattening.           Pertinent Vitals/ Pain       Pain Assessment: No/denies pain         Frequency  Min 3X/week        Progress Toward Goals  OT Goals(current goals can now be found in the care plan section)  Progress towards OT goals: Progressing toward goals     Plan Discharge plan remains appropriate       End  of Session Equipment Utilized During Treatment: Gait belt;Rolling walker   Activity Tolerance Patient tolerated treatment well   Patient Left in chair;with call bell/phone within reach;with chair alarm set;with family/visitor present   Nurse Communication          Time: 1610-9604 OT Time Calculation (min): 32 min  Charges: OT General Charges $OT Visit: 1 Procedure OT Treatments $Self Care/Home Management : 8-22 mins $Therapeutic Exercise: 8-22 mins  Kristen Barry [phone] 02/29/2016, 12:17 PM

## 2016-03-01 DIAGNOSIS — E6609 Other obesity due to excess calories: Secondary | ICD-10-CM

## 2016-03-01 DIAGNOSIS — Z6834 Body mass index (BMI) 34.0-34.9, adult: Secondary | ICD-10-CM

## 2016-03-01 LAB — VAS US CAROTID
LCCADSYS: -83 cm/s
LCCAPDIAS: 11 cm/s
LEFT ECA DIAS: -6 cm/s
LEFT VERTEBRAL DIAS: 19 cm/s
LICADSYS: -92 cm/s
Left CCA dist dias: -17 cm/s
Left CCA prox sys: 156 cm/s
Left ICA dist dias: -24 cm/s
Left ICA prox dias: -20 cm/s
Left ICA prox sys: -97 cm/s
RCCADSYS: -84 cm/s
RCCAPDIAS: 20 cm/s
RCCAPSYS: 114 cm/s
RIGHT ECA DIAS: -12 cm/s
RIGHT VERTEBRAL DIAS: 1 cm/s

## 2016-03-01 MED ORDER — ATORVASTATIN CALCIUM 40 MG PO TABS: 40.0000 mg | ORAL_TABLET | Freq: Every day | ORAL | 2 refills | Status: DC

## 2016-03-01 MED ORDER — CLOPIDOGREL BISULFATE 75 MG PO TABS: 75.0000 mg | ORAL_TABLET | Freq: Every day | ORAL | 2 refills | Status: AC

## 2016-03-01 NOTE — Progress Notes (Signed)
Patient received approval for inpatient rehab per her insurance. [facility] inpt rehab can not accept her until tomorrow. Pt medically ready and discharged. CM spoke with the family and patient and they want to stay the night and go to inpatient rehab tomorrow. MD and Dr Jacky KindleAronson made aware.

## 2016-03-01 NOTE — Clinical Social Work Note (Signed)
CSW is signing off as [facility] RNCM is aware  no further needs identified.   Dede QuerySarah Gabriell Daigneault, MSW, LCSW  Clinical Social Worker  [phone]

## 2016-03-01 NOTE — Progress Notes (Signed)
Physical Therapy Treatment Patient Details Name: Kristen Barry MRN: [id number] DOB: 05-11-1941 Today's Date: 03/01/2016    History of Present Illness Admitted with an acute stroke involving the L MCA territory.  PMH significant for: Bell's Palsy, HTN, OA, osteopenia, carpal tunnel    PT Comments    Patient tolerated increased gait distance this session. Worked on ambulating with RW and SPC and R LE coordination. Pt is motivated to participate in therapy and return to independence. Current plan remains appropriate.   Follow Up Recommendations  CIR     Equipment Recommendations  None recommended by PT    Recommendations for Other Services       Precautions / Restrictions Precautions Precautions: Fall Restrictions Weight Bearing Restrictions: No    Mobility  Bed Mobility               General bed mobility comments: Pt OOB in chair upon arrival  Transfers Overall transfer level: Needs assistance Equipment used: Rolling walker (2 wheeled);Straight cane Transfers: Sit to/from Stand Sit to Stand: Min assist         General transfer comment: from recliner; cues for R UE assist to stand; assist for balance upon standing  Ambulation/Gait Ambulation/Gait assistance: Min assist Ambulation Distance (Feet): 230 Feet Assistive device: Rolling walker (2 wheeled);Straight cane Gait Pattern/deviations: Step-through pattern;Decreased step length - right;Decreased dorsiflexion - right;Decreased stride length     General Gait Details: verbal and tactile cues for R heel strike and vc for sequencing of gait with use of SPC and to slow gait speed for more "quality" steps; ambulated with RW for half of gait and SPC the rest with need for HHA as well as SPC to steady   Stairs            Wheelchair Mobility    Modified Rankin (Stroke Patients Only) Modified Rankin (Stroke Patients Only) Pre-Morbid Rankin Score: No symptoms Modified Rankin: Moderately severe  disability     Balance     Sitting balance-Leahy Scale: Good       Standing balance-Leahy Scale: Fair                      Cognition Arousal/Alertness: Awake/alert Behavior During Therapy: WFL for tasks assessed/performed Overall Cognitive Status: Within Functional Limits for tasks assessed                      Exercises Other Exercises Other Exercises: educated pt to work on R LE coordination by Public librarian"writing alphabet" with R foot    General Comments        Pertinent Vitals/Pain Pain Assessment: No/denies pain    Home Living                      Prior Function            PT Goals (current goals can now be found in the care plan section) Acute Rehab PT Goals Patient Stated Goal: go to rehab Progress towards PT goals: Progressing toward goals    Frequency    Min 4X/week      PT Plan Current plan remains appropriate    Co-evaluation             End of Session Equipment Utilized During Treatment: Gait belt Activity Tolerance: Patient tolerated treatment well Patient left: in chair;with call bell/phone within reach;with family/visitor present     Time: 0900-0918 PT Time Calculation (min) (ACUTE ONLY): 18 min  Charges:  $  Gait Training: 8-22 mins                    G Codes:      Derek Mound, PTA Pager: [phone]   03/01/2016, 9:27 AM

## 2016-03-01 NOTE — Discharge Summary (Signed)
Discharge Summary  Kristen Barry ZOX:[id number] DOB: 02/21/1941  PCP: Lenora BoysFRIED, ROBERT L, MD  Admit date: 02/25/2016 Discharge date: 03/01/2016  Time spent: 25 minutes  Recommendations for Outpatient Follow-up:  1. Patient accepted at [facility] inpatient rehabilitation 2. Medication change: Plavix 75 mg in place of aspirin 3. New medication: Lipitor 40 mg by mouth daily   Discharge Diagnoses:  Active Hospital Problems   Diagnosis Date Noted  . CVA (cerebral infarction) 02/25/2016  . Acute CVA (cerebrovascular accident) (HCC)   . Right sided weakness 02/25/2016  . Slurred speech 02/25/2016  . Obesity 02/25/2016  . Essential hypertension 07/10/2013    Resolved Hospital Problems   Diagnosis Date Noted Date Resolved  No resolved problems to display.    Discharge Condition: Improved, being discharged to [facility]   Diet recommendation: Heart healthy   Vitals:   03/01/16 0959 03/01/16 1325  BP: 130/69 (!) 126/57  Pulse: 78 76  Resp: 18 18  Temp: 98.1 F (36.7 C) 98.2 F (36.8 C)    History of present illness:  75 year old female past history of hypertension and osteoarthritis admitted on 9/29 after having 1 day of right arm and leg weakness plus slurred speech and partial facial droop. Initial CT scan of the head was negative, however patient was out of the window for any kind of TPA intervention. MRI noted acute infarct of left paracentral pons. Patient brought in for acute CVA and further workup.   Hospital Course:  Principal Problem: Improving  Right-sided weakness secondary to acute left paracentral pons CVA (cerebral infarction): Patient evaluated by stroke service. It was recommended that she change from aspirin to Plavix. Underlying cause is felt to be secondary to small vessel disease. MRA of the head was unrevealing. Carotid Dopplers showed no significant stenoses. 2-D echocardiogram noted grade 1 diastolic dysfunction, but otherwise no  signs of clot or other focal findings. Patient seen by physical therapy who recommended inpatient rehabilitation, however no available beds here. However, she was accepted at [facility]. Lipid panel checked patient found to have LDL of 137 with target goal below 70. Started on statin medication. CBGs remained normal. Patient with no evidence of diabetes. Active Problems:   Essential hypertension: Patient alive for permissive elevated blood pressure secondary to CVA. Upon discharge, we'll restart on Norvasc and beta blocker    Obesity patient is criteria with BMI greater than 30   Procedures:  2-D echocardiogram done 10/1: Preserved ejection fraction. Grade 1 diastolic dysfunction. Trivial aortic and tricuspid regurg  Carotid Dopplers: No significant stenoses bilaterally   Consultations:   Inpatient rehabilitation  Stroke service  Discharge Exam: BP (!) 126/57 (BP Location: Left Arm)   Pulse 76   Temp 98.2 F (36.8 C) (Oral)   Resp 18   SpO2 94%   General: Alert and oriented 3, no acute distress Cardiovascular: Regular rate and rhythm, S1-S2  Respiratory: Clear to auscultation bilaterally   Discharge Instructions You were cared for by a hospitalist during your hospital stay. If you have any questions about your discharge medications or the care you received while you were in the hospital after you are discharged, you can call the unit and asked to speak with the hospitalist on call if the hospitalist that took care of you is not available. Once you are discharged, your primary care physician will handle any further medical issues. Please note that NO REFILLS for any discharge medications will be authorized once you are discharged, as it  is imperative that you return to your primary care physician (or establish a relationship with a primary care physician if you do not have one) for your aftercare needs so that they can reassess your need for medications and  monitor your lab values.  Discharge Instructions    Ambulatory referral to Neurology    Complete by:  As directed    Follow up with NP Darrol Angel at [facility] in about 6 weeks. Thanks.       Medication List    STOP taking these medications   aspirin 81 MG tablet     TAKE these medications   amLODipine 10 MG tablet Commonly known as:  NORVASC Take 10 mg by mouth daily.   atorvastatin 40 MG tablet Commonly known as:  LIPITOR Take 1 tablet (40 mg total) by mouth daily at 6 PM.   CALCIUM 1200 PO Take by mouth daily.   celecoxib 200 MG capsule Commonly known as:  CELEBREX Take 200 mg by mouth daily.   cholecalciferol 1000 units tablet Commonly known as:  VITAMIN D Take 1,000 Units by mouth daily.   clopidogrel 75 MG tablet Commonly known as:  PLAVIX Take 1 tablet (75 mg total) by mouth daily. Start taking on:  03/02/2016   Fish Oil 1000 MG Caps Take by mouth daily.   metoprolol succinate 100 MG 24 hr tablet Commonly known as:  TOPROL-XL Take 100 mg by mouth daily. Take with or immediately following a meal.   omeprazole 40 MG capsule Commonly known as:  PRILOSEC Take 40 mg by mouth daily.   traMADol 50 MG tablet Commonly known as:  ULTRAM Take 1 tablet (50 mg total) by mouth every 6 (six) hours as needed.   vitamin C 500 MG tablet Commonly known as:  ASCORBIC ACID Take 500 mg by mouth daily.      Allergies  Allergen Reactions  . Ace Inhibitors     Cough   . Codeine     Stomach upset    Follow-up Information    Nilda Riggs, NP. Schedule an appointment as soon as possible for a visit in 6 week(s).   Specialty:  Family Medicine Why:  Stroke follow up Contact information: [address] [place] [address] [phone]            The results of significant diagnostics from this hospitalization (including imaging, microbiology, ancillary and laboratory) are listed below for reference.    Significant Diagnostic  Studies: Dg Chest 2 View  Result Date: 02/25/2016 CLINICAL DATA:  Right-sided weakness since yesterday.  Hypertension. EXAM: CHEST  2 VIEW COMPARISON:  None. FINDINGS: Mild to moderate cardiomegaly.  Aortic atherosclerosis. No evidence of pulmonary infiltrate or edema. No evidence of pneumothorax or pleural effusion. Thoracic spine degenerative changes and mild dextroscoliosis noted. IMPRESSION: Cardiomegaly and aortic atherosclerosis.  No active lung disease. Electronically Signed   By: Myles Rosenthal M.D.   On: 02/25/2016 10:21   Ct Head Wo Contrast  Result Date: 02/25/2016 CLINICAL DATA:  Right-sided weakness since yesterday. EXAM: CT HEAD WITHOUT CONTRAST TECHNIQUE: Contiguous axial images were obtained from the base of the skull through the vertex without intravenous contrast. COMPARISON:  None. FINDINGS: Brain: Patchy hypodensities in the subcortical white matter. Focal low-density near the left external capsule and insular cortex suggests old disease. No evidence for acute hemorrhage, mass lesion, midline shift, hydrocephalus or large infarct. Vascular: No hyperdense vessel or unexpected calcification. Skull: No acute fracture. Sinuses/Orbits: Mild mucosal thickening along the inferior  right maxillary sinus. Other: None. IMPRESSION: No acute intracranial abnormality. Patchy disease in the white matter probably represents chronic small vessel ischemic changes. Electronically Signed   By: Richarda Overlie M.D.   On: 02/25/2016 09:14   Mr Brain Wo Contrast  Result Date: 02/25/2016 CLINICAL DATA:  Right-sided weakness and slurred speech. EXAM: MRI HEAD WITHOUT CONTRAST MRA HEAD WITHOUT CONTRAST TECHNIQUE: Multiplanar, multiecho pulse sequences of the brain and surrounding structures were obtained without intravenous contrast. Angiographic images of the head were obtained using MRA technique without contrast. COMPARISON:  CT head 02/25/2016 FINDINGS: MRI HEAD FINDINGS Brain: Acute infarct in the left paracentral  pons measuring approximately 10 x 15 mm. No other areas of acute infarct identified. Ventricle size normal. Cerebral volume normal for age. Moderate chronic microvascular ischemic change in the white matter diffusely. Chronic ischemic change in the pons. Negative for hemorrhage or mass lesion. No shift of the midline structures. Pituitary normal in size. Vascular: Normal flow voids. Skull and upper cervical spine: Negative Sinuses/Orbits: Mild mucosal edema right maxillary sinus. Normal orbits. Other: None MRA HEAD FINDINGS Both vertebral arteries patent to the basilar. Right PICA patent. Left PICA not visualized. Basilar widely patent. Superior cerebellar and posterior cerebral arteries patent bilaterally. Fetal origin left posterior cerebral artery. Internal carotid artery widely patent. Anterior and middle cerebral arteries patent bilaterally. Hypoplastic left A1 segment. Both anterior cerebral arteries supplied from the right. Negative for cerebral aneurysm. IMPRESSION: Acute infarct left paracentral pons. Moderate chronic microvascular ischemic change. Negative MRA head. Electronically Signed   By: Marlan Palau M.D.   On: 02/25/2016 13:39   Mr Maxine Glenn Head/brain ZO Cm  Result Date: 02/25/2016 CLINICAL DATA:  Right-sided weakness and slurred speech. EXAM: MRI HEAD WITHOUT CONTRAST MRA HEAD WITHOUT CONTRAST TECHNIQUE: Multiplanar, multiecho pulse sequences of the brain and surrounding structures were obtained without intravenous contrast. Angiographic images of the head were obtained using MRA technique without contrast. COMPARISON:  CT head 02/25/2016 FINDINGS: MRI HEAD FINDINGS Brain: Acute infarct in the left paracentral pons measuring approximately 10 x 15 mm. No other areas of acute infarct identified. Ventricle size normal. Cerebral volume normal for age. Moderate chronic microvascular ischemic change in the white matter diffusely. Chronic ischemic change in the pons. Negative for hemorrhage or mass  lesion. No shift of the midline structures. Pituitary normal in size. Vascular: Normal flow voids. Skull and upper cervical spine: Negative Sinuses/Orbits: Mild mucosal edema right maxillary sinus. Normal orbits. Other: None MRA HEAD FINDINGS Both vertebral arteries patent to the basilar. Right PICA patent. Left PICA not visualized. Basilar widely patent. Superior cerebellar and posterior cerebral arteries patent bilaterally. Fetal origin left posterior cerebral artery. Internal carotid artery widely patent. Anterior and middle cerebral arteries patent bilaterally. Hypoplastic left A1 segment. Both anterior cerebral arteries supplied from the right. Negative for cerebral aneurysm. IMPRESSION: Acute infarct left paracentral pons. Moderate chronic microvascular ischemic change. Negative MRA head. Electronically Signed   By: Marlan Palau M.D.   On: 02/25/2016 13:39    Microbiology: No results found for this or any previous visit (from the past 240 hour(s)).   Labs: Basic Metabolic Panel:  Recent Labs Lab 02/25/16 0900 02/25/16 0909 02/26/16 0601 02/27/16 0637  NA 137 140 136 139  K 3.9 4.0 3.4* 4.0  CL 105 103 104 106  CO2 23  --  24 25  GLUCOSE 108* 110* 94 98  BUN 16 20 13 11   CREATININE 0.80 0.80 0.68 0.64  CALCIUM 9.8  --  9.2 9.4  MG  --   --   --  2.1  PHOS  --   --   --  3.1   Liver Function Tests:  Recent Labs Lab 02/25/16 0900  AST 25  ALT 17  ALKPHOS 97  BILITOT 0.6  PROT 7.8  ALBUMIN 4.2   No results for input(s): LIPASE, AMYLASE in the last 168 hours. No results for input(s): AMMONIA in the last 168 hours. CBC:  Recent Labs Lab 02/25/16 0900 02/25/16 0909 02/26/16 0601 02/27/16 0637  WBC 7.8  --  7.3 6.8  NEUTROABS 5.9  --   --   --   HGB 15.0 15.6* 13.0 13.0  HCT 45.3 46.0 40.3 40.1  MCV 90.2  --  90.2 90.1  PLT 224  --  202 210    CBG:  Recent Labs Lab 02/25/16 0845  GLUCAP 115*       Signed:  Hollice Espy, MD [facility] 03/01/2016, 2:27 PM

## 2016-03-02 NOTE — Discharge Summary (Signed)
Discharge Summary  Kristen Barry ZOX:[id number] DOB: Dec 10, 1940  PCP: Lenora BoysFRIED, ROBERT L, MD  Admit date: 02/25/2016 Discharge date: 03/02/2016  Time spent: 25 minutes  Recommendations for Outpatient Follow-up:  1. Patient accepted at [facility] 2. Medication change: Plavix 75 mg in place of aspirin 3. New medication: Lipitor 40 mg by mouth daily   Discharge Diagnoses:  Active Hospital Problems   Diagnosis Date Noted  . CVA (cerebral infarction) 02/25/2016  . Acute CVA (cerebrovascular accident) (HCC)   . Right sided weakness 02/25/2016  . Slurred speech 02/25/2016  . Obesity 02/25/2016  . Essential hypertension 07/10/2013    Resolved Hospital Problems   Diagnosis Date Noted Date Resolved  No resolved problems to display.    Discharge Condition: Improved, being discharged to [facility]   Diet recommendation: Heart healthy   Vitals:   03/02/16 0552 03/02/16 0900  BP: (!) 110/50 136/66  Pulse: 71 90  Resp: 20 20  Temp: 99.1 F (37.3 C) 98.2 F (36.8 C)    History of present illness:  75 year old female past history of hypertension and osteoarthritis admitted on 9/29 after having 1 day of right arm and leg weakness plus slurred speech and partial facial droop. Initial CT scan of the head was negative, however patient was out of the window for any kind of TPA intervention. MRI noted acute infarct of left paracentral pons. Patient brought in for acute CVA and further workup.   Hospital Course:  Principal Problem: Improving  Right-sided weakness secondary to acute left paracentral pons CVA (cerebral infarction): Patient evaluated by stroke service. It was recommended that she change from aspirin to Plavix. Underlying cause is felt to be secondary to small vessel disease. MRA of the head was unrevealing. Carotid Dopplers showed no significant stenoses. 2-D echocardiogram noted grade 1 diastolic dysfunction, but otherwise no  signs of clot or other focal findings. Patient seen by physical therapy who recommended inpatient rehabilitation, however no available beds here. However, she was accepted at [facility] inpatient rehabilitation. Lipid panel checked patient found to have LDL of 137 with target goal below 70. Started on statin medication. CBGs remained normal. Patient with no evidence of diabetes. Active Problems:   Essential hypertension: Patient alive for permissive elevated blood pressure secondary to CVA. Upon discharge, we'll restart on Norvasc and beta blocker    Obesity patient is criteria with BMI greater than 30   Procedures:  2-D echocardiogram done 10/1: Preserved ejection fraction. Grade 1 diastolic dysfunction. Trivial aortic and tricuspid regurg  Carotid Dopplers: No significant stenoses bilaterally   Consultations:   Inpatient rehabilitation  Stroke service  Discharge Exam: BP 136/66 (BP Location: Left Arm)   Pulse 90   Temp 98.2 F (36.8 C) (Oral)   Resp 20   SpO2 97%   General: Alert and oriented 3, no acute distress Cardiovascular: Regular rate and rhythm, S1-S2  Respiratory: Clear to auscultation bilaterally   Discharge Instructions You were cared for by a hospitalist during your hospital stay. If you have any questions about your discharge medications or the care you received while you were in the hospital after you are discharged, you can call the unit and asked to speak with the hospitalist on call if the hospitalist that took care of you is not available. Once you are discharged, your primary care physician will handle any further medical issues. Please note that NO REFILLS for any discharge medications will be authorized once you are discharged, as it is  imperative that you return to your primary care physician (or establish a relationship with a primary care physician if you do not have one) for your aftercare needs so that they can reassess your need for medications and  monitor your lab values.  Discharge Instructions    Ambulatory referral to Neurology    Complete by:  As directed    Follow up with NP Darrol Angel at [facility] in about 6 weeks. Thanks.   Diet - low sodium heart healthy    Complete by:  As directed    Increase activity slowly    Complete by:  As directed        Medication List    STOP taking these medications   aspirin 81 MG tablet     TAKE these medications   amLODipine 10 MG tablet Commonly known as:  NORVASC Take 10 mg by mouth daily.   atorvastatin 40 MG tablet Commonly known as:  LIPITOR Take 1 tablet (40 mg total) by mouth daily at 6 PM.   CALCIUM 1200 PO Take by mouth daily.   celecoxib 200 MG capsule Commonly known as:  CELEBREX Take 200 mg by mouth daily.   cholecalciferol 1000 units tablet Commonly known as:  VITAMIN D Take 1,000 Units by mouth daily.   clopidogrel 75 MG tablet Commonly known as:  PLAVIX Take 1 tablet (75 mg total) by mouth daily.   Fish Oil 1000 MG Caps Take by mouth daily.   metoprolol succinate 100 MG 24 hr tablet Commonly known as:  TOPROL-XL Take 100 mg by mouth daily. Take with or immediately following a meal.   omeprazole 40 MG capsule Commonly known as:  PRILOSEC Take 40 mg by mouth daily.   traMADol 50 MG tablet Commonly known as:  ULTRAM Take 1 tablet (50 mg total) by mouth every 6 (six) hours as needed.   vitamin C 500 MG tablet Commonly known as:  ASCORBIC ACID Take 500 mg by mouth daily.      Allergies  Allergen Reactions  . Ace Inhibitors     Cough   . Codeine     Stomach upset    Follow-up Information    Nilda Riggs, NP. Schedule an appointment as soon as possible for a visit in 6 week(s).   Specialty:  Family Medicine Why:  Stroke follow up Contact information: [address] [place] [address] [phone]            The results of significant diagnostics from this hospitalization (including imaging, microbiology,  ancillary and laboratory) are listed below for reference.    Significant Diagnostic Studies: Dg Chest 2 View  Result Date: 02/25/2016 CLINICAL DATA:  Right-sided weakness since yesterday.  Hypertension. EXAM: CHEST  2 VIEW COMPARISON:  None. FINDINGS: Mild to moderate cardiomegaly.  Aortic atherosclerosis. No evidence of pulmonary infiltrate or edema. No evidence of pneumothorax or pleural effusion. Thoracic spine degenerative changes and mild dextroscoliosis noted. IMPRESSION: Cardiomegaly and aortic atherosclerosis.  No active lung disease. Electronically Signed   By: Myles Rosenthal M.D.   On: 02/25/2016 10:21   Ct Head Wo Contrast  Result Date: 02/25/2016 CLINICAL DATA:  Right-sided weakness since yesterday. EXAM: CT HEAD WITHOUT CONTRAST TECHNIQUE: Contiguous axial images were obtained from the base of the skull through the vertex without intravenous contrast. COMPARISON:  None. FINDINGS: Brain: Patchy hypodensities in the subcortical white matter. Focal low-density near the left external capsule and insular cortex suggests old disease. No evidence for acute hemorrhage, mass  lesion, midline shift, hydrocephalus or large infarct. Vascular: No hyperdense vessel or unexpected calcification. Skull: No acute fracture. Sinuses/Orbits: Mild mucosal thickening along the inferior right maxillary sinus. Other: None. IMPRESSION: No acute intracranial abnormality. Patchy disease in the white matter probably represents chronic small vessel ischemic changes. Electronically Signed   By: Richarda Overlie M.D.   On: 02/25/2016 09:14   Mr Brain Wo Contrast  Result Date: 02/25/2016 CLINICAL DATA:  Right-sided weakness and slurred speech. EXAM: MRI HEAD WITHOUT CONTRAST MRA HEAD WITHOUT CONTRAST TECHNIQUE: Multiplanar, multiecho pulse sequences of the brain and surrounding structures were obtained without intravenous contrast. Angiographic images of the head were obtained using MRA technique without contrast. COMPARISON:  CT  head 02/25/2016 FINDINGS: MRI HEAD FINDINGS Brain: Acute infarct in the left paracentral pons measuring approximately 10 x 15 mm. No other areas of acute infarct identified. Ventricle size normal. Cerebral volume normal for age. Moderate chronic microvascular ischemic change in the white matter diffusely. Chronic ischemic change in the pons. Negative for hemorrhage or mass lesion. No shift of the midline structures. Pituitary normal in size. Vascular: Normal flow voids. Skull and upper cervical spine: Negative Sinuses/Orbits: Mild mucosal edema right maxillary sinus. Normal orbits. Other: None MRA HEAD FINDINGS Both vertebral arteries patent to the basilar. Right PICA patent. Left PICA not visualized. Basilar widely patent. Superior cerebellar and posterior cerebral arteries patent bilaterally. Fetal origin left posterior cerebral artery. Internal carotid artery widely patent. Anterior and middle cerebral arteries patent bilaterally. Hypoplastic left A1 segment. Both anterior cerebral arteries supplied from the right. Negative for cerebral aneurysm. IMPRESSION: Acute infarct left paracentral pons. Moderate chronic microvascular ischemic change. Negative MRA head. Electronically Signed   By: Marlan Palau M.D.   On: 02/25/2016 13:39   Mr Maxine Glenn Head/brain ZO Cm  Result Date: 02/25/2016 CLINICAL DATA:  Right-sided weakness and slurred speech. EXAM: MRI HEAD WITHOUT CONTRAST MRA HEAD WITHOUT CONTRAST TECHNIQUE: Multiplanar, multiecho pulse sequences of the brain and surrounding structures were obtained without intravenous contrast. Angiographic images of the head were obtained using MRA technique without contrast. COMPARISON:  CT head 02/25/2016 FINDINGS: MRI HEAD FINDINGS Brain: Acute infarct in the left paracentral pons measuring approximately 10 x 15 mm. No other areas of acute infarct identified. Ventricle size normal. Cerebral volume normal for age. Moderate chronic microvascular ischemic change in the white  matter diffusely. Chronic ischemic change in the pons. Negative for hemorrhage or mass lesion. No shift of the midline structures. Pituitary normal in size. Vascular: Normal flow voids. Skull and upper cervical spine: Negative Sinuses/Orbits: Mild mucosal edema right maxillary sinus. Normal orbits. Other: None MRA HEAD FINDINGS Both vertebral arteries patent to the basilar. Right PICA patent. Left PICA not visualized. Basilar widely patent. Superior cerebellar and posterior cerebral arteries patent bilaterally. Fetal origin left posterior cerebral artery. Internal carotid artery widely patent. Anterior and middle cerebral arteries patent bilaterally. Hypoplastic left A1 segment. Both anterior cerebral arteries supplied from the right. Negative for cerebral aneurysm. IMPRESSION: Acute infarct left paracentral pons. Moderate chronic microvascular ischemic change. Negative MRA head. Electronically Signed   By: Marlan Palau M.D.   On: 02/25/2016 13:39    Microbiology: No results found for this or any previous visit (from the past 240 hour(s)).   Labs: Basic Metabolic Panel:  Recent Labs Lab 02/25/16 0900 02/25/16 0909 02/26/16 0601 02/27/16 0637  NA 137 140 136 139  K 3.9 4.0 3.4* 4.0  CL 105 103 104 106  CO2 23  --  24 25  GLUCOSE 108* 110* 94 98  BUN 16 20 13 11   CREATININE 0.80 0.80 0.68 0.64  CALCIUM 9.8  --  9.2 9.4  MG  --   --   --  2.1  PHOS  --   --   --  3.1   Liver Function Tests:  Recent Labs Lab 02/25/16 0900  AST 25  ALT 17  ALKPHOS 97  BILITOT 0.6  PROT 7.8  ALBUMIN 4.2   No results for input(s): LIPASE, AMYLASE in the last 168 hours. No results for input(s): AMMONIA in the last 168 hours. CBC:  Recent Labs Lab 02/25/16 0900 02/25/16 0909 02/26/16 0601 02/27/16 0637  WBC 7.8  --  7.3 6.8  NEUTROABS 5.9  --   --   --   HGB 15.0 15.6* 13.0 13.0  HCT 45.3 46.0 40.3 40.1  MCV 90.2  --  90.2 90.1  PLT 224  --  202 210    CBG:  Recent Labs Lab  02/25/16 0845  GLUCAP 115*       Signed:  Hollice Espy, MD [facility] 03/02/2016, 9:33 AM

## 2016-03-02 NOTE — Progress Notes (Signed)
Patient is being d/c to inpatient rehab at [facility]. Report called to the receiving nurse.

## 2016-03-02 NOTE — Care Management Note (Signed)
Case Management Note  Patient Details  Name: Cathie Oldenancy C Phetteplace MRN: [id number] Date of Birth: 10-09-1940  Subjective/Objective:                    Action/Plan: Patient discharging to [facility] today. Pts daughter is going to transport her to the facility. Discharge summary faxed to the number they provided and bedside RN given number to call report. MD has already done d/c.   Expected Discharge Date:                  Expected Discharge Plan:  IP Rehab Facility  In-House Referral:     Discharge planning Services  CM Consult  Post Acute Care Choice:    Choice offered to:     DME Arranged:    DME Agency:     HH Arranged:    HH Agency:     Status of Service:  Completed, signed off  If discussed at [employer] Length of Stay Meetings, dates discussed:    Additional Comments:  Kermit BaloKelli F Cierrah Dace, RN 03/02/2016, 7:48 AM

## 2016-04-14 ENCOUNTER — Ambulatory Visit: Payer: Medicare Other | Attending: Family Medicine

## 2016-04-14 DIAGNOSIS — I69351 Hemiplegia and hemiparesis following cerebral infarction affecting right dominant side: Secondary | ICD-10-CM | POA: Diagnosis present

## 2016-04-14 DIAGNOSIS — R2689 Other abnormalities of gait and mobility: Secondary | ICD-10-CM | POA: Diagnosis not present

## 2016-04-14 NOTE — Therapy (Signed)
[facility] [address] [place], [place], [address] Phone: [phone]   Fax:  [phone]  Physical Therapy Evaluation  Patient Details  Name: Kristen Barry MRN: [id number] Date of Birth: 1946/11/09 Referring Provider: Dr. Joycelyn Rua  Encounter Date: 04/14/2016      PT End of Session - 04/14/16 1619    Visit Number 1   Number of Visits 9   Date for PT Re-Evaluation 05/14/16   Authorization Type [employer] Medicare G-CODE AND PROGRESS NOTE EVERY 10TH VISIT.    PT Start Time 1320  pt using restroom at beginning of session   PT Stop Time 1358   PT Time Calculation (min) 38 min   Equipment Utilized During Treatment Gait belt   Activity Tolerance Patient tolerated treatment well   Behavior During Therapy WFL for tasks assessed/performed      Past Medical History:  Diagnosis Date  . CTS (carpal tunnel syndrome)   . Diverticulosis   . H/O Bell's palsy   . Hypertension   . Laryngopharyngeal reflux   . OA (osteoarthritis)   . Osteopenia   . Right sided weakness   . Seasonal allergies     History reviewed. No pertinent surgical history.  There were no vitals filed for this visit.       Subjective Assessment - 04/14/16 1323    Subjective Pt stated she felt sick and had vertigo prior to onset of CVA. Pt admitted to hospital on 02/25/16 and diagnosed with CVA, with R-sided weakness. Pt d/c from acute care on 03/02/16 and admitted to inpt rehab in [facility] for 12 days. Pt states she now walks with a SPC and feels balance is very bad.  Pt fatigues easily s/p CVA. Pt reports she had HHPT and OT [facility].    Pertinent History HTN, OA, osteopenia, hx R-sided Bell's palsy   Patient Stated Goals To get rid of the cane, and improve balance.    Currently in Pain? No/denies            [facility] PT Assessment - 04/14/16 1328      Assessment   Medical Diagnosis Balance disorders s/p CVA   Referring  Provider Dr. Joycelyn Rua   Onset Date/Surgical Date 02/25/16   Hand Dominance Right   Prior Therapy Inpt, OPPT and HHPT     Precautions   Precautions Fall   Precaution Comments based on FGA score.     Restrictions   Weight Bearing Restrictions No     Balance Screen   Has the patient fallen in the past 6 months No   Has the patient had a decrease in activity level because of a fear of falling?  Yes   Is the patient reluctant to leave their home because of a fear of falling?  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      Sensation   Light Touch Appears Intact   Additional Comments Pt reports intermittent N/T during amb., especially after CVA.     Coordination   Gross Motor Movements are Fluid and Coordinated Yes   Fine Motor Movements are Fluid and Coordinated No  decr. R hand speed     Posture/Postural Control   Posture/Postural Control Postural limitations   Postural Limitations Rounded Shoulders;Posterior pelvic tilt     Tone   Assessment Location --  Pt reports RLE "moves on its own  at night".     ROM / Strength   AROM / PROM / Strength AROM;Strength     AROM   Overall AROM  Deficits   Overall AROM Comments Decr. R shoulder IR 2/2 ant. shoulder pain. L UE and BLE AROM WFL.     Strength   Overall Strength Deficits   Overall Strength Comments B hip ext not formally tested but weakness suspected 2/2 gait impairments. LLE grossly: 4+/5 except for knee flex: 3+/5. R LE: hip flex: 3+/5, knee ext: 4/5 ,knee flex: 3+/5, ankle DF: 4/5. R gross hip abd/add: 3+/5 and L hip abd/add 4/5.      Transfers   Transfers Sit to Stand;Stand to Sit   Sit to Stand 5: Supervision;Without upper extremity assist;From chair/3-in-1   Stand to Sit 5: Supervision;Without upper extremity assist;To chair/3-in-1     Ambulation/Gait   Ambulation/Gait Yes   Ambulation/Gait Assistance 5: Supervision   Ambulation/Gait Assistance Details No overt LOB but decr. speed, especially during FGA test without SPC 2/2 fear of falling.    Ambulation Distance (Feet) 100 Feet   Assistive device Straight cane   Gait Pattern Step-through pattern;Decreased stride length;Decreased arm swing - right;Decreased dorsiflexion - right   Ambulation Surface Level;Indoor   Gait velocity 1.3896ft/sec.     Functional Gait  Assessment   Gait assessed  Yes   Gait Level Surface Walks 20 ft, slow speed, abnormal gait pattern, evidence for imbalance or deviates 10-15 in outside of the 12 in walkway width. Requires more than 7 sec to ambulate 20 ft.   Change in Gait Speed Able to change speed, demonstrates mild gait deviations, deviates 6-10 in outside of the 12 in walkway width, or no gait deviations, unable to achieve a major change in velocity, or uses a change in velocity, or uses an assistive device.  9.8sec.   Gait with Horizontal Head Turns Performs head turns smoothly with slight change in gait velocity (eg, minor disruption to smooth gait path), deviates 6-10 in outside 12 in walkway width, or uses an assistive device.    Gait with Vertical Head Turns Performs task with moderate change in gait velocity, slows down, deviates 10-15 in outside 12 in walkway width but recovers, can continue to walk.   Gait and Pivot Turn Pivot turns safely in greater than 3 sec and stops with no loss of balance, or pivot turns safely within 3 sec and stops with mild imbalance, requires small steps to catch balance.   Step Over Obstacle Is able to step over one shoe box (4.5 in total height) but must slow down and adjust steps  to clear box safely. May require verbal cueing.   Gait with Narrow Base of Support Ambulates less than 4 steps heel to toe or cannot perform without assistance.   Gait with Eyes Closed Walks 20 ft, slow speed, abnormal gait pattern, evidence for imbalance, deviates 10-15 in outside 12 in walkway width. Requires more than 9 sec to ambulate 20 ft.  16.7sec.   Ambulating Backwards Walks 20 ft, uses assistive device, slower speed, mild gait deviations, deviates 6-10 in outside 12 in walkway width.   Steps Two feet to a stair, must use rail.   Total Score 13                           PT Education - 04/14/16 1619    Education provided Yes   Education Details PT discussed frequency/duration and outcome measures with pt.    Person(s) Educated Patient   Methods Explanation   Comprehension Verbalized understanding          PT Short Term Goals - 04/14/16 1626      PT SHORT TERM GOAL #1   Title same as LTGs           PT Long Term Goals - 04/14/16 1627      PT LONG TERM GOAL #1   Title Pt will be IND in HEP to improve strength, balance, and flexibility. TARGET DATE FOR ALL LTGS: 05/12/16   Status New     PT LONG TERM GOAL #2   Title Pt will verbalize understanding of CVA risk factors and warning signs to decr. risk of additional CVA.   Status New     PT LONG TERM GOAL #3   Title Pt will improve FGA score to >/=19/30 to decr. falls risk.   Status New     PT LONG TERM GOAL #4    Title Pt will amb. 200' over even terrain without AD, IND, to safely amb. at home.    Status New     PT LONG TERM GOAL #5   Title Pt will amb. 1000' over even/uneven terrain with SPC, at MOD I level, to improve functional mobility and amb. outdoors.    Status New     Additional Long Term Goals   Additional Long Term Goals Yes     PT LONG TERM GOAL #6   Title Pt will improve gait speed without AD to >/=2.6062ft/sec to safely amb. in the community.    Status New               Plan - 04/14/16 1620    Clinical Impression Statement Pt is a pleasant 75y/o female presenting to OPPT neuro s/p CVA on 02/25/16. Pt's PMH significant for the following: HTN, OA, osteopenia, hx R-sided Bell's palsy. Pt presented with the following deficits: gait deviations, impaired balance, decreased strength, impaired coordination, decr. endurance, and intermittent N/T in B foot during gait. Pt's gait speed is slightly above the falls risk cut off but below the speed to safely amb. in the community. Pt's FGA score indicates pt is at high risk for falls. Pt may benefit from OT eval to address difficulty with hand writing and decr. R shoulder IR. Pt would benefit from skilled PT to improve safety during functional mobility.    Rehab Potential Good   Clinical Impairments Affecting Rehab Potential co-morbidities   PT Frequency 2x / week   PT Duration 4 weeks   PT Treatment/Interventions ADLs/Self Care Home Management;Biofeedback;Canalith  Repostioning;Neuromuscular re-education;Balance training;Therapeutic exercise;Therapeutic activities;Functional mobility training;Stair training;Gait training;DME Instruction;Orthotic Fit/Training;Patient/family education;Vestibular;Manual techniques   PT Next Visit Plan Provide CVA ed handout. Perform and initiate balance/strength/flexibility HEP   Recommended Other Services OT eval (pt thinking about if she wants this or not)   Consulted and Agree with Plan of Care Patient       Patient will benefit from skilled therapeutic intervention in order to improve the following deficits and impairments:  Abnormal gait, Decreased endurance, Decreased knowledge of use of DME, Decreased balance, Decreased mobility, Decreased coordination, Impaired flexibility, Postural dysfunction, Decreased strength, Impaired UE functional use, Impaired sensation (intermittent N/T in B foot)  Visit Diagnosis: Other abnormalities of gait and mobility - Plan: PT plan of care cert/re-cert  Hemiplegia and hemiparesis following cerebral infarction affecting right dominant side (HCC) - Plan: PT plan of care cert/re-cert      G-Codes - 05/10/2016 1630    Functional Assessment Tool Used FGA: 13/30 and gait speed with SPC: 1.2ft/sec.   Functional Limitation Mobility: Walking and moving around   Mobility: Walking and Moving Around Current Status [phone]) At least 40 percent but less than 60 percent impaired, limited or restricted   Mobility: Walking and Moving Around Goal Status [phone]) At least 1 percent but less than 20 percent impaired, limited or restricted       Problem List Patient Active Problem List   Diagnosis Date Noted  . Acute CVA (cerebrovascular accident) (HCC)   . Right sided weakness 02/25/2016  . Slurred speech 02/25/2016  . CVA (cerebral infarction) 02/25/2016  . Obesity 02/25/2016  . Essential hypertension 07/10/2013    Christiaan Strebeck L 05-10-2016, 4:33 PM  [facility] [address] [place], [place], [address] Phone: [phone]   Fax:  [phone]  Name: KIEARA SCHWARK MRN: [id number] Date of Birth: 10/17/1940  Zerita Boers, PT,DPT 05/10/2016 4:33 PM Phone: [phone] Fax: [phone]

## 2016-04-18 ENCOUNTER — Ambulatory Visit: Payer: Medicare Other | Admitting: Physical Therapy

## 2016-04-18 DIAGNOSIS — R2689 Other abnormalities of gait and mobility: Secondary | ICD-10-CM | POA: Diagnosis not present

## 2016-04-18 DIAGNOSIS — I69351 Hemiplegia and hemiparesis following cerebral infarction affecting right dominant side: Secondary | ICD-10-CM

## 2016-04-18 NOTE — Therapy (Signed)
[facility] [address] [place], [place], [address] Phone: [phone]   Fax:  [phone]  Physical Therapy Treatment  Patient Details  Name: Kristen Barry MRN: [id number] Date of Birth: 14-Jun-1940 Referring Provider: Dr. Joycelyn RuaStephen Meyers  Encounter Date: 04/18/2016      PT End of Session - 04/18/16 0922    Visit Number 2   Number of Visits 9   Date for PT Re-Evaluation 05/14/16   Authorization Type UHC Medicare G-CODE AND PROGRESS NOTE EVERY 10TH VISIT.    PT Start Time [phone]   PT Stop Time 0921   PT Time Calculation (min) 44 min   Equipment Utilized During Treatment Gait belt   Activity Tolerance Patient tolerated treatment well   Behavior During Therapy WFL for tasks assessed/performed      Past Medical History:  Diagnosis Date  . CTS (carpal tunnel syndrome)   . Diverticulosis   . H/O Bell's palsy   . Hypertension   . Laryngopharyngeal reflux   . OA (osteoarthritis)   . Osteopenia   . Right sided weakness   . Seasonal allergies     No past surgical history on file.  There were no vitals filed for this visit.      Subjective Assessment - 04/18/16 0839    Subjective doing well, no falls.  "I try to stay on my feet."   Pertinent History HTN, OA, osteopenia, hx R-sided Bell's palsy   Patient Stated Goals To get rid of the cane, and improve balance.    Currently in Pain? No/denies            [facility] Assessment - 04/18/16 0843      6 Minute Walk- Baseline   6 Minute Walk- Baseline yes   BP (mmHg) 155/74   HR (bpm) 61   02 Sat (%RA) 95 %   Modified Borg Scale for Dyspnea 0- Nothing at all   Perceived Rate of Exertion (Borg) 6-     6 Minute walk- Post Test   6 Minute Walk Post Test yes   BP (mmHg) 160/70   HR (bpm) 80   02 Sat (%RA) 97 %   Modified Borg Scale for Dyspnea 2- Mild shortness of breath   Perceived Rate of Exertion (Borg) 10-     6 minute walk test results    Aerobic  Endurance Distance Walked 1102                     [facility] Treatment/Exercise - 04/18/16 0907      Self-Care   Self-Care Other Self-Care Comments   Other Self-Care Comments  educated on CVA risk factors/warning signs - pt verbalized understanding     Exercises   Exercises Knee/Hip     Knee/Hip Exercises: Seated   Sit to Sand 10 reps;without UE support  on compliant surface     Knee/Hip Exercises: Supine   Bridges Both;2 sets;10 reps   Straight Leg Raises Right;2 sets;10 reps   Straight Leg Raises Limitations 3#     Knee/Hip Exercises: Sidelying   Hip ABduction Right;2 sets;10 reps   Hip ABduction Limitations 2#; min cues for technique                PT Education - 04/18/16 0921    Education provided Yes   Education Details CVA ed   Starwood HotelsPerson(s) Educated Patient   Methods Explanation;Handout   Comprehension Verbalized understanding  PT Short Term Goals - 04/14/16 1626      PT SHORT TERM GOAL #1   Title same as LTGs           PT Long Term Goals - 04/18/16 [id number]      PT LONG TERM GOAL #1   Title Pt will be IND in HEP to improve strength, balance, and flexibility. TARGET DATE FOR ALL LTGS: 05/12/16   Status On-going     PT LONG TERM GOAL #2   Title Pt will verbalize understanding of CVA risk factors and warning signs to decr. risk of additional CVA.   Status On-going     PT LONG TERM GOAL #3   Title Pt will improve FGA score to >/=19/30 to decr. falls risk.   Status On-going     PT LONG TERM GOAL #4   Title Pt will amb. 200' over even terrain without AD, IND, to safely amb. at home.    Status On-going     PT LONG TERM GOAL #5   Title Pt will amb. 1000' over even/uneven terrain with SPC, at MOD I level, to improve functional mobility and amb. outdoors.    Status On-going     PT LONG TERM GOAL #6   Title Pt will improve gait speed without AD to >/=2.4662ft/sec to safely amb. in the community.    Status On-going                Plan - 04/18/16 0922    Clinical Impression Statement Pt amb 1102' in 6MWT with expected response to exercise after test.  6MWT performed without AD without LOB.  Will continue to benefit from PT to maximize function.   Rehab Potential Good   Clinical Impairments Affecting Rehab Potential co-morbidities   PT Treatment/Interventions ADLs/Self Care Home Management;Biofeedback;Canalith Repostioning;Neuromuscular re-education;Balance training;Therapeutic exercise;Therapeutic activities;Functional mobility training;Stair training;Gait training;DME Instruction;Orthotic Fit/Training;Patient/family education;Vestibular;Manual techniques   PT Next Visit Plan balance/strength/flexibility HEP, gait without device on compliant surface   Consulted and Agree with Plan of Care Patient      Patient will benefit from skilled therapeutic intervention in order to improve the following deficits and impairments:  Abnormal gait, Decreased endurance, Decreased knowledge of use of DME, Decreased balance, Decreased mobility, Decreased coordination, Impaired flexibility, Postural dysfunction, Decreased strength, Impaired UE functional use, Impaired sensation  Visit Diagnosis: Other abnormalities of gait and mobility  Hemiplegia and hemiparesis following cerebral infarction affecting right dominant side [facility](HCC)     Problem List Patient Active Problem List   Diagnosis Date Noted  . Acute CVA (cerebrovascular accident) (HCC)   . Right sided weakness 02/25/2016  . Slurred speech 02/25/2016  . CVA (cerebral infarction) 02/25/2016  . Obesity 02/25/2016  . Essential hypertension 07/10/2013       Clarita CraneStephanie F Amna Welker, PT, DPT 04/18/16 9:24 AM    [facility] [address] [place], [place], [address] Phone: [phone]   Fax:  [phone]  Name: Kristen Barry MRN: [id number] Date of Birth: 1941-04-24

## 2016-04-24 ENCOUNTER — Ambulatory Visit (INDEPENDENT_AMBULATORY_CARE_PROVIDER_SITE_OTHER): Payer: Medicare Other | Admitting: Nurse Practitioner

## 2016-04-24 ENCOUNTER — Encounter: Payer: Self-pay | Admitting: Nurse Practitioner

## 2016-04-24 VITALS — BP 140/80 | HR 64 | Ht 62.0 in | Wt 198.6 lb

## 2016-04-24 DIAGNOSIS — R531 Weakness: Secondary | ICD-10-CM | POA: Diagnosis not present

## 2016-04-24 DIAGNOSIS — R269 Unspecified abnormalities of gait and mobility: Secondary | ICD-10-CM | POA: Diagnosis not present

## 2016-04-24 DIAGNOSIS — I639 Cerebral infarction, unspecified: Secondary | ICD-10-CM

## 2016-04-24 DIAGNOSIS — I10 Essential (primary) hypertension: Secondary | ICD-10-CM

## 2016-04-24 HISTORY — DX: Unspecified abnormalities of gait and mobility: R26.9

## 2016-04-24 NOTE — Progress Notes (Signed)
I reviewed above note and agree with the assessment and plan.  Marvel PlanJindong Damarius Karnes, MD PhD Stroke Neurology 04/24/2016 4:40 PM

## 2016-04-24 NOTE — Patient Instructions (Addendum)
Stressed the importance of management of risk factors to prevent further stroke Continue Plavix for secondary stroke prevention Maintain strict control of hypertension with blood pressure goal below 130/90, today's reading 140/80 continue antihypertensive medications Control of diabetes with hemoglobin A1c below 6.5  Cholesterol with LDL cholesterol less than 70, followed by primary care,  most recent137 continue Lipitor Continue physical therapy and home exercise program Exercise by walking, slowly increase , eat healthy diet with whole grains,  fresh fruits and vegetables Discussed risk for recurrent stroke/ TIA and answered additional questions Facial A arms Speech Time to call [phone]

## 2016-04-24 NOTE — Progress Notes (Signed)
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 [name]. History is obtained directly from the patient who is an excellent historian. She states that she first noted some numbness in her right foot at about 0700 yesterday. This was associated with weakness of the right arm and leg as well as slurred speech. Her symptoms have persisted unchanged. She did not appreciate any symptoms in her face. She denies vision loss, difficulty swallowing, vertigo, double vision, discoordination, or balance problems. She has occasional headaches but nothing out of the ordinary today. CT head in the ED reportedly showed no acute abnormality with scattered hypodensities in the white matter of both cerebral hemispheres. MRI of the brain with acute infarct left paracentral pons moderate chronic microvascular ischemic change. MRA of the head negative carotid Doppler unremarkable 2-D echo EF 55% no wall  motion abnormalities . LDL 137 hemoglobin A1c 5.5 she was switched from aspirin to Plavix for secondary stroke prevention. She returns to the stroke clinic today for follow-up. She remains on Plavix without bruising or bleeding. She is continuing to get some physical therapy for balance. He remains on Lipitor without complaints of myalgias. Speech has improved and she has mild right-sided weakness she returns for reevaluation   REVIEW OF SYSTEMS: Full 14 system review of systems performed and notable only for those listed, all others are neg:  Constitutional: neg  Cardiovascular: neg Ear/Nose/Throat: neg  Skin: neg Eyes: neg Respiratory: neg Gastroitestinal: neg  Hematology/Lymphatic: neg  Endocrine:  neg Musculoskeletal: Joint pain Allergy/Immunology: neg Neurological: Weakness Psychiatric: neg Sleep : neg   ALLERGIES: Allergies  Allergen Reactions  . Ace Inhibitors     Cough   . Codeine     Stomach upset     HOME MEDICATIONS: Outpatient Medications Prior to Visit  Medication Sig Dispense Refill  . amLODipine (NORVASC) 10 MG tablet Take 10 mg by mouth daily.    [name]. atorvastatin (LIPITOR) 40 MG tablet Take 1 tablet (40 mg total) by mouth daily at 6 PM. 30 tablet 2  . Calcium Carbonate-Vit D-Min (CALCIUM 1200 PO) Take by mouth daily.    . celecoxib (CELEBREX) 200 MG capsule Take 200 mg by mouth daily.    . cholecalciferol (VITAMIN D) 1000 UNITS tablet Take 1,000 Units by mouth daily.    . clopidogrel (PLAVIX) 75 MG tablet Take 1 tablet (75 mg total) by mouth daily. 30 tablet 2  . metoprolol succinate (TOPROL-XL) 100 MG 24 hr tablet Take 100 mg by mouth daily. Take with or immediately following a meal.    . Omega-3 Fatty Acids (FISH OIL) 1000 MG CAPS Take by mouth daily.    [name]. omeprazole (PRILOSEC) 40 MG capsule Take 40 mg by mouth daily.    . traMADol (ULTRAM) 50 MG tablet Take 1 tablet (50 mg total) by mouth every 6 (six) hours as needed. 15 tablet 0  . vitamin C (ASCORBIC ACID) 500 MG tablet Take 500 mg by mouth daily.     No facility-administered medications prior to visit.     PAST MEDICAL HISTORY: Past Medical History:  Diagnosis Date  . CTS (carpal tunnel syndrome)   . Diverticulosis   . H/O Bell's palsy   . Hypertension   .  Laryngopharyngeal reflux   . OA (osteoarthritis)   . Osteopenia   . Right sided weakness   . Seasonal allergies     PAST SURGICAL HISTORY: History reviewed. No pertinent surgical history.  FAMILY HISTORY: Family History  Problem Relation Age of Onset  . CVA Mother   . Diabetes Mother   . Osteoarthritis Mother   . Hypertension Mother   . CAD Father   . Heart Problems Brother     stent  . Heart attack Brother     2-3 caths  .  Melanoma Paternal Aunt     SOCIAL HISTORY: Social History   Social History  . Marital status: Married    Spouse name: N/A  . Number of children: 3  . Years of education: N/A   Occupational History  . Not on file.   Social History Main Topics  . Smoking status: Former Games developermoker  . Smokeless tobacco: Never Used     Comment: stop smoking when 75 years old   . Alcohol use No  . Drug use: No  . Sexual activity: Not on file   Other Topics Concern  . Not on file   Social History Narrative  . No narrative on file     PHYSICAL EXAM  Vitals:   04/24/16 1004  BP: 140/80   Pulse: 64  Weight: 198 lb 9.6 oz (90.1 kg)  Height: 5\' 2"  (1.575 m)   Body mass index is 36.32 kg/m.  Generalized: Well developed, Obese female in no acute distress  Head: normocephalic and atraumatic,. Oropharynx benign  Neck: Supple, no carotid bruits  Cardiac: Regular rate rhythm, no murmur  Musculoskeletal: No deformity   Neurological examination   Mentation: Alert oriented to time, place, history taking. Attention span and concentration appropriate. Recent and remote memory intact.  Follows all commands speech and language fluent.   Cranial nerve II-XII: Fundoscopic exam reveals sharp disc margins.Pupils were equal round reactive to light extraocular movements were full, visual field were full on confrontational test. Mild right facial droop ,Facial sensation  normal. Hearing was intact to finger rubbing bilaterally. Uvula tongue midline. head turning and shoulder shrug were normal and symmetric.Tongue protrusion into cheek strength was normal. Motor: normal bulk and tone, full strength in the BUE, BLE, except mild right upper extremity weakness and right lower extremity weakness.  Sensory: normal and symmetric to light touch, pinprick, and  Vibration, in the upper and lower extremities Coordination: finger-nose-finger, heel-to-shin bilaterally, no dysmetria Reflexes: 1+ upper lower and symmetric  plantar responses were flexor bilaterally. Gait and Station: Rising up from seated position without assistance, normal stance,  moderate stride, good arm swing, smooth turning, able to perform tiptoe, and heel walking without difficulty. Tandem gait is unsteady. Ambulates with a single-point cane  DIAGNOSTIC DATA (LABS, IMAGING, TESTING) - I reviewed patient records, labs, notes, testing and imaging myself where available.  Lab Results  Component Value Date   WBC 6.8 02/27/2016   HGB 13.0 02/27/2016   HCT 40.1 02/27/2016   MCV 90.1 02/27/2016   PLT 210 02/27/2016      Component Value Date/Time   NA 139 02/27/2016 0637   K 4.0 02/27/2016 0637   CL 106 02/27/2016 0637   CO2 25 02/27/2016 0637   GLUCOSE 98 02/27/2016 0637   BUN 11 02/27/2016 0637   CREATININE 0.64 02/27/2016 0637   CALCIUM 9.4 02/27/2016 0637   PROT 7.8 02/25/2016 0900   ALBUMIN 4.2 02/25/2016 0900   AST 25 02/25/2016 0900  ALT 17 02/25/2016 0900   ALKPHOS 97 02/25/2016 0900   BILITOT 0.6 02/25/2016 0900   GFRNONAA >60 02/27/2016 0637   GFRAA >60 02/27/2016 4540   Lab Results  Component Value Date   CHOL 213 (H) 02/25/2016   HDL 49 02/25/2016   LDLCALC 137 (H) 02/25/2016   TRIG 137 02/25/2016   CHOLHDL 4.3 02/25/2016   Lab Results  Component Value Date   HGBA1C 5.5 02/25/2016    ASSESSMENT AND PLAN  75 y.o. year old female  has a past medical history Hypertension; Laryngopharyngeal reflux; OA (osteoarthritis); Osteopenia; Right sided weakness; and Seasonal allergies. here For hospital follow-up of stroke,  MRI of the brain with acute infarct left paracentral pons moderate chronic microvascular ischemic change. MRA of the head negative carotid Doppler unremarkable 2-D echo EF 55% no wall  motion abnormalities . LDL 137 hemoglobin A1c 5.5 she was switched from aspirin to Plavix for secondary stroke prevention.    PLAN: Stressed the importance of management of risk factors to prevent further  stroke Continue Plavix for secondary stroke prevention Maintain strict control of hypertension with blood pressure goal below 130/90, today's reading 140/80 continue antihypertensive medications Control of diabetes with hemoglobin A1c below 6.5  Cholesterol with LDL cholesterol less than 70, followed by primary care,  most recent137 continue Lipitor Continue physical therapy and home exercise program Exercise by walking, slowly increase , eat healthy diet with whole grains,  fresh fruits and vegetables Discussed risk for recurrent stroke/ TIA and answered additional questions This was a prolonged visit requiring 35 minutes of high complexity with extensive review of history, hospital chart, counseling and answering questions Nilda Riggs, [facility], [facility], APRN  [facility] [address] [place], [place] [address] [phone]

## 2016-04-25 ENCOUNTER — Ambulatory Visit: Payer: Medicare Other | Admitting: Physical Therapy

## 2016-04-25 VITALS — BP 149/74

## 2016-04-25 DIAGNOSIS — R2689 Other abnormalities of gait and mobility: Secondary | ICD-10-CM | POA: Diagnosis not present

## 2016-04-25 DIAGNOSIS — I69351 Hemiplegia and hemiparesis following cerebral infarction affecting right dominant side: Secondary | ICD-10-CM

## 2016-04-25 NOTE — Therapy (Signed)
[facility] [address] [place], [place], [address] Phone: [phone]   Fax:  [phone]  Physical Therapy Treatment  Patient Details  Name: Kristen Barry MRN: [id number] Date of Birth: 01-Aug-1940 Referring Provider: Dr. Joycelyn RuaStephen Meyers  Encounter Date: 04/25/2016      PT End of Session - 04/25/16 0917    Visit Number 3   Number of Visits 9   Date for PT Re-Evaluation 05/14/16   Authorization Type UHC Medicare G-CODE AND PROGRESS NOTE EVERY 10TH VISIT.    PT Start Time [phone]   PT Stop Time 0923   PT Time Calculation (min) 41 min   Activity Tolerance Patient tolerated treatment well   Behavior During Therapy [facility] for tasks assessed/performed      Past Medical History:  Diagnosis Date  . CTS (carpal tunnel syndrome)   . Diverticulosis   . H/O Bell's palsy   . Hypertension   . Laryngopharyngeal reflux   . OA (osteoarthritis)   . Osteopenia   . Right sided weakness   . Seasonal allergies     No past surgical history on file.  Vitals:   04/25/16 0847  BP: (!) 149/74        Subjective Assessment - 04/25/16 0845    [place].  Went to [place] yesterday, said driving was up to us.  Nothing new to report from MD.   Pertinent History HTN, OA, osteopenia, hx R-sided Bell's palsy   Patient Stated Goals To get rid of the cane, and improve balance.    Currently in Pain? No/denies                         [facility] Adult PT Treatment/Exercise - 04/25/16 0853      Knee/Hip Exercises: Aerobic   Nustep L4 x 8 min     Knee/Hip Exercises: Standing   Hip Flexion Both;15 reps;Knee bent   Hip Flexion Limitations 3# on right   Hip Abduction Both;15 reps;Knee straight   Abduction Limitations 3# on Right   Hip Extension Both;15 reps;Knee straight   Extension Limitations 3# on Right     Knee/Hip Exercises: Seated   Sit to Sand 15 reps;without UE support      Knee/Hip Exercises: Supine   Bridges Both;15 reps   Straight Leg Raises Right;15 reps   Straight Leg Raises Limitations 3#   Other Supine Knee/Hip Exercises single limb clamshell with green theraband x 15 bil                  PT Short Term Goals - 04/14/16 1626      PT SHORT TERM GOAL #1   Title same as LTGs           PT Long Term Goals - 04/18/16 [id number]      PT LONG TERM GOAL #1   Title Pt will be IND in HEP to improve strength, balance, and flexibility. TARGET DATE FOR ALL LTGS: 05/12/16   Status On-going     PT LONG TERM GOAL #2   Title Pt will verbalize understanding of CVA risk factors and warning signs to decr. risk of additional CVA.   Status On-going     PT LONG TERM GOAL #3   Title Pt will improve FGA score to >/=19/30 to decr. falls risk.   Status On-going     PT LONG TERM GOAL #4   Title Pt will  amb. 200' over even terrain without AD, IND, to safely amb. at home.    Status On-going     PT LONG TERM GOAL #5   Title Pt will amb. 1000' over even/uneven terrain with SPC, at MOD I level, to improve functional mobility and amb. outdoors.    Status On-going     PT LONG TERM GOAL #6   Title Pt will improve gait speed without AD to >/=2.6462ft/sec to safely amb. in the community.    Status On-going               Plan - 04/25/16 0917    Clinical Impression Statement Session focused on establishing HEP for RLE strengthening and balance.  Pt reported expected fatigue after session.  Will continue to benefit from PT to maximize function.   Clinical Impairments Affecting Rehab Potential co-morbidities   PT Treatment/Interventions ADLs/Self Care Home Management;Biofeedback;Canalith Repostioning;Neuromuscular re-education;Balance training;Therapeutic exercise;Therapeutic activities;Functional mobility training;Stair training;Gait training;DME Instruction;Orthotic Fit/Training;Patient/family education;Vestibular;Manual techniques   PT Next Visit Plan  review HEP, gait without device on compliant surface   Consulted and Agree with Plan of Care Patient      Patient will benefit from skilled therapeutic intervention in order to improve the following deficits and impairments:  Abnormal gait, Decreased endurance, Decreased knowledge of use of DME, Decreased balance, Decreased mobility, Decreased coordination, Impaired flexibility, Postural dysfunction, Decreased strength, Impaired UE functional use, Impaired sensation  Visit Diagnosis: Other abnormalities of gait and mobility  Hemiplegia and hemiparesis following cerebral infarction affecting right dominant side [facility](HCC)     Problem List Patient Active Problem List   Diagnosis Date Noted  . Abnormality of gait 04/24/2016  . Acute CVA (cerebrovascular accident) (HCC)   . Right sided weakness 02/25/2016  . Slurred speech 02/25/2016  . CVA (cerebral infarction) 02/25/2016  . Obesity 02/25/2016  . Essential hypertension 07/10/2013       Clarita CraneStephanie F Reakwon Barren, PT, DPT 04/25/16 9:24 AM    [facility] [address] [place], [place], [address] Phone: [phone]   Fax:  [phone]  Name: Kristen Barry MRN: [id number] Date of Birth: 03-Nov-1940

## 2016-04-25 NOTE — Patient Instructions (Signed)
BED LEVEL EXERCISES: DO JUST THESE 2-3 TIMES A DAY AND ALTERNATE BED AND STANDING EXERCISES EACH DAY, OR DO THESES 1-2 TIMES A DAY AND STANDING EXERCISES 1-2 TIMES A DAY.  Bridging    Slowly raise buttocks from floor, hold for 5 seconds. Repeat __15__ times per set. Do _1___ sets per session. Do _2-3___ sessions per day.  http://orth.exer.us/1097   Copyright  VHI. All rights reserved.   Strengthening: Hip Abductor - Resisted    With band looped around both legs above knees, push one leg apart and keep the other leg still.  Repeat with other leg.  Repeat __15__ times per set. Do __1__ sets per session. Do _2-3___ sessions per day.  http://orth.exer.us/689   Copyright  VHI. All rights reserved.    Straight Leg Raise    Slowly raise locked right leg _6-8___ inches from floor.  Use 3 lbs around right ankle. Repeat __15__ times per set. Do _1___ sets per session. Do _2-3___ sessions per day.  http://orth.exer.us/1103   Copyright  VHI. All rights reserved.   Functional Quadriceps: Sit to Stand    Sit on edge of chair, feet flat on floor. Stand upright, extending knees fully. Repeat __15__ times per set. Do __1__ sets per session. Do __2-3__ sessions per day.  http://orth.exer.us/735   Copyright  VHI. All rights reserved.    STANDING EXERCISES  Standing Marching   Using a chair if necessary, march in place. Use 3 lbs on right leg. Repeat __15__ times on each leg. Do __2-3__ sessions per day.  http://gt2.exer.us/344   Copyright  VHI. All rights reserved.   Hip Backward Kick   Using a chair for balance, keep legs shoulder width apart and toes pointed for- ward. Slowly extend one leg back, keeping knee straight. Do not lean forward. Repeat with other leg.  Use 3 lbs on right leg.  Repeat __15__ times. Do __2-3__ sessions per day.  http://gt2.exer.us/340   Copyright  VHI. All rights reserved.   Hip Side Kick   Holding a chair for balance, keep legs  shoulder width apart and toes pointed forward. Swing a leg out to side, keeping knee straight. Do not lean. Repeat using other leg.  Use 3 lbs on right leg.  Repeat __15__ times. Do __2-3__ sessions per day.  http://gt2.exer.us/342   Copyright  VHI. All rights reserved.

## 2016-04-27 ENCOUNTER — Ambulatory Visit: Payer: Medicare Other | Admitting: Physical Therapy

## 2016-04-28 ENCOUNTER — Ambulatory Visit: Payer: Medicare Other | Attending: Family Medicine | Admitting: Physical Therapy

## 2016-04-28 DIAGNOSIS — I69351 Hemiplegia and hemiparesis following cerebral infarction affecting right dominant side: Secondary | ICD-10-CM | POA: Diagnosis present

## 2016-04-28 DIAGNOSIS — R2689 Other abnormalities of gait and mobility: Secondary | ICD-10-CM | POA: Insufficient documentation

## 2016-04-28 NOTE — Therapy (Signed)
[facility] [address] [place], [place], [address] Phone: [phone]   Fax:  [phone]  Physical Therapy Treatment  Patient Details  Name: Kristen Barry C Harder MRN: [id number] Date of Birth: 06-30-1940 Referring Provider: Dr. Joycelyn RuaStephen Meyers  Encounter Date: 04/28/2016      PT End of Session - 04/28/16 0923    Visit Number 4   Number of Visits 9   Date for PT Re-Evaluation 05/14/16   Authorization Type U[employer] Medicare G-CODE AND PROGRESS NOTE EVERY 10TH VISIT.    PT Start Time [id number]   PT Stop Time 0925   PT Time Calculation (min) 41 min   Equipment Utilized During Treatment Gait belt   Activity Tolerance Patient tolerated treatment well   Behavior During Therapy WFL for tasks assessed/performed      Past Medical History:  Diagnosis Date  . CTS (carpal tunnel syndrome)   . Diverticulosis   . H/O Bell's palsy   . Hypertension   . Laryngopharyngeal reflux   . OA (osteoarthritis)   . Osteopenia   . Right sided weakness   . Seasonal allergies     No past surgical history on file.  There were no vitals filed for this visit.      Subjective Assessment - 04/28/16 0846    Subjective reports she's tired today due to a lot of activity yesterday   Patient Stated Goals To get rid of the cane, and improve balance.    Currently in Pain? No/denies                         [facility] Treatment/Exercise - 04/28/16 0850      Self-Care   Self-Care Other Self-Care Comments   Other Self-Care Comments  discussed fatigue after CVA     Knee/Hip Exercises: Aerobic   Nustep L6 x 5 min     Knee/Hip Exercises: Standing   Heel Raises Both;20 reps   Heel Raises Limitations toe raises x 20   Hip Flexion Both;15 reps;Knee bent   Hip Flexion Limitations 3#; alternating on compliant surface   Hip Abduction Both;15 reps;Knee straight   Abduction Limitations 3#; alternating on compliant surface   Hip  Extension Both;15 reps;Knee straight   Extension Limitations 3#; alternating on compliant surface   Lateral Step Up Both;10 reps;Hand Hold: 0;Step Height: 4"   Forward Step Up Both;10 reps;Hand Hold: 0;Step Height: 4"             Balance Exercises - 04/28/16 0903      Balance Exercises: Standing   Balance Beam red balance beam: horizontal/vertical head turns, alt taps and kicks forward           PT Education - 04/28/16 0922    Education provided Yes   Education Details fatigue following CVA   Person(s) Educated Patient   Methods Explanation;Handout   Comprehension Verbalized understanding          PT Short Term Goals - 04/14/16 1626      PT SHORT TERM GOAL #1   Title same as LTGs           PT Long Term Goals - 04/18/16 [id number]      PT LONG TERM GOAL #1   Title Pt will be IND in HEP to improve strength, balance, and flexibility. TARGET DATE FOR ALL LTGS: 05/12/16   Status On-going     PT LONG TERM GOAL #2   Title Pt will  verbalize understanding of CVA risk factors and warning signs to decr. risk of additional CVA.   Status On-going     PT LONG TERM GOAL #3   Title Pt will improve FGA score to >/=19/30 to decr. falls risk.   Status On-going     PT LONG TERM GOAL #4   Title Pt will amb. 200' over even terrain without AD, IND, to safely amb. at home.    Status On-going     PT LONG TERM GOAL #5   Title Pt will amb. 1000' over even/uneven terrain with SPC, at MOD I level, to improve functional mobility and amb. outdoors.    Status On-going     PT LONG TERM GOAL #6   Title Pt will improve gait speed without AD to >/=2.5462ft/sec to safely amb. in the community.    Status On-going               Plan - 04/28/16 0924    Clinical Impression Statement Pt tolerated balance activities today with minguard A and only minor LOB with pt able to self correct.  Will continue to benefit from PT to maximize function.    PT Treatment/Interventions ADLs/Self Care  Home Management;Biofeedback;Canalith Repostioning;Neuromuscular re-education;Balance training;Therapeutic exercise;Therapeutic activities;Functional mobility training;Stair training;Gait training;DME Instruction;Orthotic Fit/Training;Patient/family education;Vestibular;Manual techniques   PT Next Visit Plan gait without device on compliant surface, continue balance and strengthening   Consulted and Agree with Plan of Care Patient      Patient will benefit from skilled therapeutic intervention in order to improve the following deficits and impairments:  Abnormal gait, Decreased endurance, Decreased knowledge of use of DME, Decreased balance, Decreased mobility, Decreased coordination, Impaired flexibility, Postural dysfunction, Decreased strength, Impaired UE functional use, Impaired sensation  Visit Diagnosis: Other abnormalities of gait and mobility  Hemiplegia and hemiparesis following cerebral infarction affecting right dominant side [facility](HCC)     Problem List Patient Active Problem List   Diagnosis Date Noted  . Abnormality of gait 04/24/2016  . Acute CVA (cerebrovascular accident) (HCC)   . Right sided weakness 02/25/2016  . Slurred speech 02/25/2016  . CVA (cerebral infarction) 02/25/2016  . Obesity 02/25/2016  . Essential hypertension 07/10/2013       Clarita CraneStephanie F Quinisha Mould, PT, DPT 04/28/16 9:26 AM    [facility] [address] [place], [place], [address] Phone: [phone]   Fax:  [phone]  Name: Kristen Barry MRN: [id number] Date of Birth: [birth year]

## 2016-05-03 ENCOUNTER — Ambulatory Visit: Payer: Medicare Other | Admitting: Physical Therapy

## 2016-05-03 DIAGNOSIS — R2689 Other abnormalities of gait and mobility: Secondary | ICD-10-CM | POA: Diagnosis not present

## 2016-05-03 DIAGNOSIS — I69351 Hemiplegia and hemiparesis following cerebral infarction affecting right dominant side: Secondary | ICD-10-CM

## 2016-05-03 NOTE — Therapy (Signed)
[facility] [address] [place], [place], [address] Phone: [phone]   Fax:  [phone]  Physical Therapy Treatment  Patient Details  Name: Kristen Barry MRN: [id number] Date of Birth: [birth year] Referring Provider: Dr. Joycelyn RuaStephen Meyers  Encounter Date: 05/03/2016      PT End of Session - 05/03/16 0847    Visit Number 5   Number of Visits 9   Date for PT Re-Evaluation 05/14/16   Authorization Type [employer] Medicare G-CODE AND PROGRESS NOTE EVERY 10TH VISIT.    PT Start Time 0800   PT Stop Time 0840   PT Time Calculation (min) 40 min   Equipment Utilized During Treatment Gait belt   Activity Tolerance Patient tolerated treatment well   Behavior During Therapy WFL for tasks assessed/performed      Past Medical History:  Diagnosis Date  . CTS (carpal tunnel syndrome)   . Diverticulosis   . H/O Bell's palsy   . Hypertension   . Laryngopharyngeal reflux   . OA (osteoarthritis)   . Osteopenia   . Right sided weakness   . Seasonal allergies     No past surgical history on file.  There were no vitals filed for this visit.      Subjective Assessment - 05/03/16 0759    Subjective doing pretty well, no new changes or complaints   Pertinent History HTN, OA, osteopenia, hx R-sided Bell's palsy   Patient Stated Goals To get rid of the cane, and improve balance.    Currently in Pain? No/denies                         [facility] Treatment/Exercise - 05/03/16 0829      Knee/Hip Exercises: Seated   Long Arc Quad Right;15 reps;Weights   Long Arc Quad Weight 3 lbs.   Marching Limitations RLE x 15; 3#   Hamstring Curl Right;15 reps   Hamstring Limitations green theraband     Knee/Hip Exercises: Supine   Bridges Both;15 reps   Bridges Limitations with toe raise     Knee/Hip Exercises: Sidelying   Clams RLE x15 with green theraband     Knee/Hip Exercises: Prone   Hip Extension Right;15  reps   Hip Extension Limitations min cues to decrease trunk rotation             Balance Exercises - 05/03/16 0827      Balance Exercises: Standing   Gait with Head Turns Forward;Retro;Foam/compliant surface  with min A             PT Short Term Goals - 04/14/16 1626      PT SHORT TERM GOAL #1   Title same as LTGs           PT Long Term Goals - 04/18/16 [id number]      PT LONG TERM GOAL #1   Title Pt will be IND in HEP to improve strength, balance, and flexibility. TARGET DATE FOR ALL LTGS: 05/12/16   Status On-going     PT LONG TERM GOAL #2   Title Pt will verbalize understanding of CVA risk factors and warning signs to decr. risk of additional CVA.   Status On-going     PT LONG TERM GOAL #3   Title Pt will improve FGA score to >/=19/30 to decr. falls risk.   Status On-going     PT LONG TERM GOAL #4   Title Pt will amb. 200'  over even terrain without AD, IND, to safely amb. at home.    Status On-going     PT LONG TERM GOAL #5   Title Pt will amb. 1000' over even/uneven terrain with SPC, at MOD I level, to improve functional mobility and amb. outdoors.    Status On-going     PT LONG TERM GOAL #6   Title Pt will improve gait speed without AD to >/=2.362ft/sec to safely amb. in the community.    Status On-going               Plan - 05/03/16 0848    Clinical Impression Statement Continues to do well with compliant surface activities and able to progress to dynamic mobility exercises today with min A 2/2 mild instability.  Will continue to benefit from PT to maximize functional mobility.   Clinical Impairments Affecting Rehab Potential co-morbidities   PT Treatment/Interventions ADLs/Self Care Home Management;Biofeedback;Canalith Repostioning;Neuromuscular re-education;Balance training;Therapeutic exercise;Therapeutic activities;Functional mobility training;Stair training;Gait training;DME Instruction;Orthotic Fit/Training;Patient/family  education;Vestibular;Manual techniques   PT Next Visit Plan gait without device on compliant surface, continue balance and strengthening   Consulted and Agree with Plan of Care Patient      Patient will benefit from skilled therapeutic intervention in order to improve the following deficits and impairments:  Abnormal gait, Decreased endurance, Decreased knowledge of use of DME, Decreased balance, Decreased mobility, Decreased coordination, Impaired flexibility, Postural dysfunction, Decreased strength, Impaired UE functional use, Impaired sensation  Visit Diagnosis: Other abnormalities of gait and mobility  Hemiplegia and hemiparesis following cerebral infarction affecting right dominant side [facility](HCC)     Problem List Patient Active Problem List   Diagnosis Date Noted  . Abnormality of gait 04/24/2016  . Acute CVA (cerebrovascular accident) (HCC)   . Right sided weakness 02/25/2016  . Slurred speech 02/25/2016  . CVA (cerebral infarction) 02/25/2016  . Obesity 02/25/2016  . Essential hypertension 07/10/2013       Kristen CraneStephanie F Quante Barry, PT, DPT 05/03/16 8:50 AM    [facility] [address] [place], [place], [address] Phone: [phone]   Fax:  [phone]  Name: Kristen Barry MRN: [id number] Date of Birth: [birth year]

## 2016-05-05 ENCOUNTER — Ambulatory Visit: Payer: Medicare Other | Admitting: Physical Therapy

## 2016-05-05 DIAGNOSIS — R2689 Other abnormalities of gait and mobility: Secondary | ICD-10-CM

## 2016-05-05 DIAGNOSIS — I69351 Hemiplegia and hemiparesis following cerebral infarction affecting right dominant side: Secondary | ICD-10-CM

## 2016-05-05 NOTE — Therapy (Signed)
[facility] [address] [place], [place], [address] Phone: [phone]   Fax:  [phone]  Physical Therapy Treatment  Patient Details  Name: Kristen Barry MRN: [id number] Date of Birth: 04-28-1941 Referring Provider: Dr. Joycelyn RuaStephen Meyers  Encounter Date: 05/05/2016      PT End of Session - 05/05/16 0832    Visit Number 6   Number of Visits 9   Date for PT Re-Evaluation 05/14/16   Authorization Type [employer] Medicare G-CODE AND PROGRESS NOTE EVERY 10TH VISIT.    PT Start Time 0759   PT Stop Time [phone]   PT Time Calculation (min) 39 min   Equipment Utilized During Treatment Gait belt   Activity Tolerance Patient tolerated treatment well   Behavior During Therapy WFL for tasks assessed/performed      Past Medical History:  Diagnosis Date  . CTS (carpal tunnel syndrome)   . Diverticulosis   . H/O Bell's palsy   . Hypertension   . Laryngopharyngeal reflux   . OA (osteoarthritis)   . Osteopenia   . Right sided weakness   . Seasonal allergies     No past surgical history on file.  There were no vitals filed for this visit.      Subjective Assessment - 05/05/16 0801    Subjective tried to walk with ankle weight yesterday but couldn't do it.     Patient Stated Goals To get rid of the cane, and improve balance.    Currently in Pain? No/denies                         [facility] Treatment/Exercise - 05/05/16 [id number]      Knee/Hip Exercises: Aerobic   Nustep L6 x 8 min             Balance Exercises - 05/05/16 0802      Balance Exercises: Standing   Step Over Hurdles / Cones on compliant surface 6-8" hurdles forwards and laterally with minguard A and single tap/double tap to cones with min A   Other Standing Exercises gait on heels, toes and lateral ankle scoots with supervision and UE support; difficulty maintaining RLE dorsiflexion             PT Short Term Goals - 04/14/16  1626      PT SHORT TERM GOAL #1   Title same as LTGs           PT Long Term Goals - 04/18/16 [id number]      PT LONG TERM GOAL #1   Title Pt will be IND in HEP to improve strength, balance, and flexibility. TARGET DATE FOR ALL LTGS: 05/12/16   Status On-going     PT LONG TERM GOAL #2   Title Pt will verbalize understanding of CVA risk factors and warning signs to decr. risk of additional CVA.   Status On-going     PT LONG TERM GOAL #3   Title Pt will improve FGA score to >/=19/30 to decr. falls risk.   Status On-going     PT LONG TERM GOAL #4   Title Pt will amb. 200' over even terrain without AD, IND, to safely amb. at home.    Status On-going     PT LONG TERM GOAL #5   Title Pt will amb. 1000' over even/uneven terrain with SPC, at MOD I level, to improve functional mobility and amb. outdoors.    Status On-going  PT LONG TERM GOAL #6   Title Pt will improve gait speed without AD to >/=2.762ft/sec to safely amb. in the community.    Status On-going               Plan - 05/05/16 [id number]    Clinical Impression Statement Pt reports decreased fatigue and improved activity tolerance at home.  Tolerates exercises well in clinic with occasional seated rest breaks.  Will continue to benefit from PT to maximize function.    PT Treatment/Interventions ADLs/Self Care Home Management;Biofeedback;Canalith Repostioning;Neuromuscular re-education;Balance training;Therapeutic exercise;Therapeutic activities;Functional mobility training;Stair training;Gait training;DME Instruction;Orthotic Fit/Training;Patient/family education;Vestibular;Manual techniques   PT Next Visit Plan gait without device on compliant surface, continue balance and strengthening   Consulted and Agree with Plan of Care Patient      Patient will benefit from skilled therapeutic intervention in order to improve the following deficits and impairments:  Abnormal gait, Decreased endurance, Decreased knowledge of use of  DME, Decreased balance, Decreased mobility, Decreased coordination, Impaired flexibility, Postural dysfunction, Decreased strength, Impaired UE functional use, Impaired sensation  Visit Diagnosis: Other abnormalities of gait and mobility  Hemiplegia and hemiparesis following cerebral infarction affecting right dominant side [facility](HCC)     Problem List Patient Active Problem List   Diagnosis Date Noted  . Abnormality of gait 04/24/2016  . Acute CVA (cerebrovascular accident) (HCC)   . Right sided weakness 02/25/2016  . Slurred speech 02/25/2016  . CVA (cerebral infarction) 02/25/2016  . Obesity 02/25/2016  . Essential hypertension 07/10/2013       Clarita CraneStephanie F Neil Errickson, PT, DPT 05/05/16 8:38 AM    [facility] [address] [place], [place], [address] Phone: [phone]   Fax:  [phone]  Name: Kristen Barry MRN: [id number] Date of Birth: Dec 02, 1940

## 2016-05-08 ENCOUNTER — Ambulatory Visit: Payer: Medicare Other | Admitting: Physical Therapy

## 2016-05-08 DIAGNOSIS — R2689 Other abnormalities of gait and mobility: Secondary | ICD-10-CM

## 2016-05-08 DIAGNOSIS — I69351 Hemiplegia and hemiparesis following cerebral infarction affecting right dominant side: Secondary | ICD-10-CM

## 2016-05-08 NOTE — Therapy (Signed)
[facility] [address] [place], [place], [address] Phone: [phone]   Fax:  [phone]  Physical Therapy Treatment  Patient Details  Name: Kristen Barry MRN: [id number] Date of Birth: 12-19-1940 Referring Provider: Dr. Orpah Melter  Encounter Date: 05/08/2016      PT End of Session - 05/08/16 1018    Visit Number 7   Number of Visits 9   Date for PT Re-Evaluation 05/14/16   Authorization Type [employer] Medicare G-CODE AND PROGRESS NOTE EVERY 10TH VISIT.    PT Start Time [phone]   PT Stop Time 1010   PT Time Calculation (min) 39 min   Activity Tolerance Patient tolerated treatment well   Behavior During Therapy WFL for tasks assessed/performed      Past Medical History:  Diagnosis Date  . CTS (carpal tunnel syndrome)   . Diverticulosis   . H/O Bell's palsy   . Hypertension   . Laryngopharyngeal reflux   . OA (osteoarthritis)   . Osteopenia   . Right sided weakness   . Seasonal allergies     No past surgical history on file.  There were no vitals filed for this visit.      Subjective Assessment - 05/08/16 0934    Subjective stayed inside all weekend due to snow.  ready to wrap up this week.   Patient Stated Goals To get rid of the cane, and improve balance.    Currently in Pain? No/denies            [facility] PT Assessment - 05/08/16 0939      Ambulation/Gait   Gait velocity 3.16 ft/sec  10.37 sec     Functional Gait  Assessment   Gait assessed  Yes   Gait Level Surface Walks 20 ft in less than 7 sec but greater than 5.5 sec, uses assistive device, slower speed, mild gait deviations, or deviates 6-10 in outside of the 12 in walkway width.   Change in Gait Speed Able to smoothly change walking speed without loss of balance or gait deviation. Deviate no more than 6 in outside of the 12 in walkway width.   Gait with Horizontal Head Turns Performs head turns smoothly with slight change in gait  velocity (eg, minor disruption to smooth gait path), deviates 6-10 in outside 12 in walkway width, or uses an assistive device.   Gait with Vertical Head Turns Performs task with slight change in gait velocity (eg, minor disruption to smooth gait path), deviates 6 - 10 in outside 12 in walkway width or uses assistive device   Gait and Pivot Turn Pivot turns safely within 3 sec and stops quickly with no loss of balance.   Step Over Obstacle Is able to step over one shoe box (4.5 in total height) without changing gait speed. No evidence of imbalance.   Gait with Narrow Base of Support Ambulates less than 4 steps heel to toe or cannot perform without assistance.   Gait with Eyes Closed Walks 20 ft, slow speed, abnormal gait pattern, evidence for imbalance, deviates 10-15 in outside 12 in walkway width. Requires more than 9 sec to ambulate 20 ft.   Ambulating Backwards Walks 20 ft, uses assistive device, slower speed, mild gait deviations, deviates 6-10 in outside 12 in walkway width.   Steps Alternating feet, must use rail.   Total Score 19                     [facility]  Adult PT Treatment/Exercise - 05/08/16 0939      Ambulation/Gait   Ambulation/Gait Assistance 7: Independent   Ambulation Distance (Feet) 440 Feet   Assistive device None   Ambulation Surface Level;Indoor     Knee/Hip Exercises: Standing   Hip Flexion Both;15 reps;Knee bent   Hip Flexion Limitations 3# on right   Hip Abduction Both;15 reps;Knee straight   Abduction Limitations 3# on right   Hip Extension Both;15 reps;Knee straight   Extension Limitations 3# on right     Knee/Hip Exercises: Seated   Sit to Sand 15 reps;without UE support     Knee/Hip Exercises: Supine   Bridges Both;15 reps   Straight Leg Raises Right;15 reps   Straight Leg Raises Limitations 3#   Other Supine Knee/Hip Exercises single limb clamshell with green theraband x 15 bil                  PT Short Term Goals - 04/14/16 1626       PT SHORT TERM GOAL #1   Title same as LTGs           PT Long Term Goals - 05/08/16 1018      PT LONG TERM GOAL #1   Title Pt will be IND in HEP to improve strength, balance, and flexibility. TARGET DATE FOR ALL LTGS: 05/12/16   Baseline 05/08/16: Independent with HEP given 04/25/16; did not assess other HEPs due to time constraints - will check next visit   Status Partially Met     PT LONG TERM GOAL #2   Title Pt will verbalize understanding of CVA risk factors and warning signs to decr. risk of additional CVA.   Status On-going     PT LONG TERM GOAL #3   Title Pt will improve FGA score to >/=19/30 to decr. falls risk.   Status Achieved     PT LONG TERM GOAL #4   Title Pt will amb. 200' over even terrain without AD, IND, to safely amb. at home.    Status Achieved     PT LONG TERM GOAL #5   Title Pt will amb. 1000' over even/uneven terrain with SPC, at MOD I level, to improve functional mobility and amb. outdoors.    Status On-going     PT LONG TERM GOAL #6   Title Pt will improve gait speed without AD to >/=2.105f/sec to safely amb. in the community.    Status Achieved               Plan - 05/08/16 1019    Clinical Impression Statement Pt has met all assessed LTGs at this time and independent with 04/25/16 HEP.  Anticipate pt will be ready for d/c next visit.   PT Treatment/Interventions ADLs/Self Care Home Management;Biofeedback;Canalith Repostioning;Neuromuscular re-education;Balance training;Therapeutic exercise;Therapeutic activities;Functional mobility training;Stair training;Gait training;DME Instruction;Orthotic Fit/Training;Patient/family education;Vestibular;Manual techniques   PT Next Visit Plan check remaining LTGs, d/c PT, FOTO, g code   Consulted and Agree with Plan of Care Patient      Patient will benefit from skilled therapeutic intervention in order to improve the following deficits and impairments:  Abnormal gait, Decreased endurance,  Decreased knowledge of use of DME, Decreased balance, Decreased mobility, Decreased coordination, Impaired flexibility, Postural dysfunction, Decreased strength, Impaired UE functional use, Impaired sensation  Visit Diagnosis: Other abnormalities of gait and mobility  Hemiplegia and hemiparesis following cerebral infarction affecting right dominant side ([facility]     Problem List Patient Active Problem List   Diagnosis Date Noted  .  Abnormality of gait 04/24/2016  . Acute CVA (cerebrovascular accident) ([place])   . Right sided weakness 02/25/2016  . Slurred speech 02/25/2016  . CVA (cerebral infarction) 02/25/2016  . Obesity 02/25/2016  . Essential hypertension 07/10/2013       Laureen Abrahams, PT, DPT 05/08/16 10:21 AM    [facility] [address] [place], [place], [address] Phone: [phone]   Fax:  [phone]  Name: SHARMON CHERAMIE MRN: [id number] Date of Birth: [birth year]

## 2016-05-12 ENCOUNTER — Ambulatory Visit: Payer: Medicare Other

## 2016-05-16 ENCOUNTER — Ambulatory Visit: Payer: Medicare Other

## 2016-05-16 DIAGNOSIS — R2689 Other abnormalities of gait and mobility: Secondary | ICD-10-CM | POA: Diagnosis not present

## 2016-05-16 DIAGNOSIS — I69351 Hemiplegia and hemiparesis following cerebral infarction affecting right dominant side: Secondary | ICD-10-CM

## 2016-05-16 NOTE — Therapy (Signed)
[facility] [address] [place], [place], [address] Phone: [phone]   Fax:  [phone]  Physical Therapy Treatment  Patient Details  Name: Kristen Barry MRN: [id number] Date of Birth: 1941/01/29 Referring Provider: Dr. Orpah Barry  Encounter Date: 05/16/2016      PT End of Session - 05/16/16 0910    Visit Number 8   Number of Visits 9   Date for PT Re-Evaluation 05/14/16   Authorization Type [employer] G-CODE AND PROGRESS NOTE EVERY 10TH VISIT.    PT Start Time [phone]   PT Stop Time 0909   PT Time Calculation (min) 23 min   Activity Tolerance Patient tolerated treatment well   Behavior During Therapy [facility] for tasks assessed/performed      Past Medical History:  Diagnosis Date  . CTS (carpal tunnel syndrome)   . Diverticulosis   . H/O Bell's palsy   . Hypertension   . Laryngopharyngeal reflux   . OA (osteoarthritis)   . Osteopenia   . Right sided weakness   . Seasonal allergies     History reviewed. No pertinent surgical history.  There were no vitals filed for this visit.      Subjective Assessment - 05/16/16 0848    Subjective Pt denied falls or changes since last visit.    Pertinent History HTN, OA, osteopenia, hx R-sided Bell's palsy   Patient Stated Goals To get rid of the cane, and improve balance.    Currently in Pain? No/denies                         [facility] Adult PT Treatment/Exercise - 05/16/16 0908      Ambulation/Gait   Ambulation/Gait Yes   Ambulation/Gait Assistance 7: Independent   Ambulation/Gait Assistance Details No LOB noted. Pt did require a seated rest break after amb. 2/2 fatigue and to drink water.   Ambulation Distance (Feet) 1000 Feet   Assistive device None   Gait Pattern Step-through pattern;Decreased stride length;Decreased dorsiflexion - right   Ambulation Surface Level;Unlevel;Indoor;Outdoor;Paved           Self Care:     PT  Education - 05/16/16 0857    Education provided Yes   Education Details PT reviewed HEP and added progression and modifications prn. PT discussed goal progress. PT educated pt on improving endurance by incr. amb. time, pt verbalized she has a track near her house where she can amb. PT encouraged pt to inform MD if fatigue persists, but educated pt that fatigue is common after CVA.  Pt able to verbalize sx's/signs and risk factors for CVA.   Person(s) Educated Patient   Methods Explanation   Comprehension Verbalized understanding     Pt completed FOTO after session, not charged: ABC: 89.4% with scores closer to 100% indicating pt has incr. Confidence in balance.      PT Short Term Goals - 04/14/16 1626      PT SHORT TERM GOAL #1   Title same as LTGs           PT Long Term Goals - 05/16/16 0623      PT LONG TERM GOAL #1   Title Pt will be IND in HEP to improve strength, balance, and flexibility. TARGET DATE FOR ALL LTGS: 05/12/16   Status Achieved     PT LONG TERM GOAL #2   Title Pt will verbalize understanding of CVA risk factors and warning signs to decr. risk  of additional CVA.   Status Achieved     PT LONG TERM GOAL #3   Title Pt will improve FGA score to >/=19/30 to decr. falls risk.   Status Achieved     PT LONG TERM GOAL #4   Title Pt will amb. 200' over even terrain without AD, IND, to safely amb. at home.    Status Achieved     PT LONG TERM GOAL #5   Title Pt will amb. 1000' over even/uneven terrain with SPC, at MOD I level, to improve functional mobility and amb. outdoors.    Status Achieved     PT LONG TERM GOAL #6   Title Pt will improve gait speed without AD to >/=2.86f/sec to safely amb. in the community.    Status Achieved               Plan - 101/01/180910    Clinical Impression Statement Pt has met all LTGs, please see pt d/c summary for details.    PT Treatment/Interventions ADLs/Self Care Home Management;Biofeedback;Canalith  Repostioning;Neuromuscular re-education;Balance training;Therapeutic exercise;Therapeutic activities;Functional mobility training;Stair training;Gait training;DME Instruction;Orthotic Fit/Training;Patient/family education;Vestibular;Manual techniques   PT Next Visit Plan d/c   Consulted and Agree with Plan of Care Patient      Patient will benefit from skilled therapeutic intervention in order to improve the following deficits and impairments:  Abnormal gait, Decreased endurance, Decreased knowledge of use of DME, Decreased balance, Decreased mobility, Decreased coordination, Impaired flexibility, Postural dysfunction, Decreased strength, Impaired UE functional use, Impaired sensation  Visit Diagnosis: Hemiplegia and hemiparesis following cerebral infarction affecting right dominant side ([address] - Plan: PT plan of care cert/re-cert  Other abnormalities of gait and mobility - Plan: PT plan of care cert/re-cert       G-Codes - [id number]    Functional Assessment Tool Used FGA: 19/30 and gait speed no AD: 3.145fsec.   Functional Limitation Mobility: Walking and moving around   Mobility: Walking and Moving Around Goal Status ([id number] least 1 percent but less than 20 percent impaired, limited or restricted   Mobility: Walking and Moving Around Discharge Status ([id number] least 1 percent but less than 20 percent impaired, limited or restricted      Problem List Patient Active Problem List   Diagnosis Date Noted  . Abnormality of gait 04/24/2016  . Acute CVA (cerebrovascular accident) ([place]  . Right sided weakness 02/25/2016  . Slurred speech 02/25/2016  . CVA (cerebral infarction) 02/25/2016  . Obesity 02/25/2016  . Essential hypertension 07/10/2013    Barry,Kristen L 122018/01/019:19 AM  [place]hone: [phone] Fax:  [phone]: Kristen JAKUBEKRN:  [id number] of Birth: 2/12-27-1942PHYSICAL THERAPY DISCHARGE SUMMARY  Visits from Start of Care: 8  Current functional level related to goals / functional outcomes:     PT Long Term Goals - 12January 01, [id number]      PT LONG TERM GOAL #1   Title Pt will be IND in HEP to improve strength, balance, and flexibility. TARGET DATE FOR ALL LTGS: 05/12/16   Status Achieved     PT LONG TERM GOAL #2   Title Pt will verbalize understanding of CVA risk factors and warning signs to decr. risk of additional CVA.   Status Achieved     PT LONG TERM GOAL #3   Title Pt will improve FGA score to >/=19/30 to decr. falls risk.  Status Achieved     PT LONG TERM GOAL #4   Title Pt will amb. 200' over even terrain without AD, IND, to safely amb. at home.    Status Achieved     PT LONG TERM GOAL #5   Title Pt will amb. 1000' over even/uneven terrain with SPC, at MOD I level, to improve functional mobility and amb. outdoors.    Status Achieved     PT LONG TERM GOAL #6   Title Pt will improve gait speed without AD to >/=2.59f/sec to safely amb. in the community.    Status Achieved        Remaining deficits: Fatigue   Education / Equipment: HEP  Plan: Patient agrees to discharge.  Patient goals were met. Patient is being discharged due to meeting the stated rehab goals.  ?????        JGeoffry Paradise PT,DPT 05/16/16 9:20 AM Phone: [phone]: [phone]

## 2016-05-16 NOTE — Patient Instructions (Signed)
  Perform strengthening every other day. You can increase the resistance as tolerated (adding more weight as tolerated).  [employer] has adjustable ankle weights.

## 2016-05-18 ENCOUNTER — Ambulatory Visit: Payer: Medicare Other

## 2016-07-28 ENCOUNTER — Ambulatory Visit (INDEPENDENT_AMBULATORY_CARE_PROVIDER_SITE_OTHER): Payer: Medicare Other | Admitting: Nurse Practitioner

## 2016-07-28 ENCOUNTER — Encounter: Payer: Self-pay | Admitting: Nurse Practitioner

## 2016-07-28 VITALS — BP 138/71 | HR 58 | Wt 197.0 lb

## 2016-07-28 DIAGNOSIS — R531 Weakness: Secondary | ICD-10-CM | POA: Diagnosis not present

## 2016-07-28 DIAGNOSIS — I639 Cerebral infarction, unspecified: Secondary | ICD-10-CM | POA: Diagnosis not present

## 2016-07-28 DIAGNOSIS — R4781 Slurred speech: Secondary | ICD-10-CM

## 2016-07-28 DIAGNOSIS — I10 Essential (primary) hypertension: Secondary | ICD-10-CM

## 2016-07-28 DIAGNOSIS — R269 Unspecified abnormalities of gait and mobility: Secondary | ICD-10-CM | POA: Diagnosis not present

## 2016-07-28 NOTE — Patient Instructions (Addendum)
Stressed the importance of continued management of risk factors to prevent further stroke Continue Plavix for secondary stroke prevention Maintain strict control of hypertension with blood pressure goal below 130/90, today's reading 138/71 continue antihypertensive medications Cholesterol with LDL cholesterol less than 70, followed by primary care,  continue Lipitor Continue exercise by walking, , eat healthy diet with whole grains,  fresh fruits and vegetables  use cane if necessary  Follow up in 6 months Stroke Prevention Some medical conditions and behaviors are associated with an increased chance of having a stroke. You may prevent a stroke by making healthy choices and managing medical conditions. How can I reduce my risk of having a stroke?  Stay physically active. Get at least 30 minutes of activity on most or all days.  Do not smoke. It may also be helpful to avoid exposure to secondhand smoke.  Limit alcohol use. Moderate alcohol use is considered to be:  No more than 2 drinks per day for men.  No more than 1 drink per day for nonpregnant women.  Eat healthy foods. This involves:  Eating 5 or more servings of fruits and vegetables a day.  Making dietary changes that address high blood pressure (hypertension), high cholesterol, diabetes, or obesity.  Manage your cholesterol levels.  Making food choices that are high in fiber and low in saturated fat, trans fat, and cholesterol may control cholesterol levels.  Take any prescribed medicines to control cholesterol as directed by your health care provider.  Manage your diabetes.  Controlling your carbohydrate and sugar intake is recommended to manage diabetes.  Take any prescribed medicines to control diabetes as directed by your health care provider.  Control your hypertension.  Making food choices that are low in salt (sodium), saturated fat, trans fat, and cholesterol is recommended to manage hypertension.  Ask your  health care provider if you need treatment to lower your blood pressure. Take any prescribed medicines to control hypertension as directed by your health care provider.  If you are 20-67 years of age, have your blood pressure checked every 3-5 years. If you are 46 years of age or older, have your blood pressure checked every year.  Maintain a healthy weight.  Reducing calorie intake and making food choices that are low in sodium, saturated fat, trans fat, and cholesterol are recommended to manage weight.  Stop drug abuse.  Avoid taking birth control pills.  Talk to your health care provider about the risks of taking birth control pills if you are over 25 years old, smoke, get migraines, or have ever had a blood clot.  Get evaluated for sleep disorders (sleep apnea).  Talk to your health care provider about getting a sleep evaluation if you snore a lot or have excessive sleepiness.  Take medicines only as directed by your health care provider.  For some people, aspirin or blood thinners (anticoagulants) are helpful in reducing the risk of forming abnormal blood clots that can lead to stroke. If you have the irregular heart rhythm of atrial fibrillation, you should be on a blood thinner unless there is a good reason you cannot take them.  Understand all your medicine instructions.  Make sure that other conditions (such as anemia or atherosclerosis) are addressed. Get help right away if:  You have sudden weakness or numbness of the face, arm, or leg, especially on one side of the body.  Your face or eyelid droops to one side.  You have sudden confusion.  You have trouble speaking (aphasia)  or understanding.  You have sudden trouble seeing in one or both eyes.  You have sudden trouble walking.  You have dizziness.  You have a loss of balance or coordination.  You have a sudden, severe headache with no known cause.  You have new chest pain or an irregular heartbeat. Any of  these symptoms may represent a serious problem that is an emergency. Do not wait to see if the symptoms will go away. Get medical help at once. Call your local emergency services (911 in [place] Do not drive yourself to the hospital. This information is not intended to replace advice given to you by your health care provider. Make sure you discuss any questions you have with your health care provider. Document Released: 06/22/2004 Document Revised: 10/21/2015 Document Reviewed: 11/15/2012 Elsevier Interactive Patient Education  2017 [employer]

## 2016-07-28 NOTE — Progress Notes (Signed)
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 She is currently on Plavix for secondary stroke prevention without further stroke or TIA symptoms. She has no bruising and no bleeding. In addition she is on Lipitor for hyperlipidemia and she denies any muscle aches or myalgias. Blood pressure in the office today 138/71. Losartan has been added to her regimen. She continues to complain with some slurring of speech particularly when she is fatigued. She occasionally exercises by walking. Her physical therapy has concluded. She continues to have mild right-sided weakness. She has not had any falls. . She returns for reevaluation    HISTORY 04/24/16 CM75-yo RH woman who presents to the ED reporting slurred speech and right-sided weakness that started yesterday morning 02/25/16. [name] History is obtained directly from the patient who is an excellent historian. She states that she first noted some numbness in her right foot at about 0700 yesterday. This was associated with weakness of the right arm and leg as well as slurred speech. Her symptoms have persisted unchanged. She did not appreciate any symptoms in her face. She denies vision loss, difficulty swallowing, vertigo, double vision, discoordination, or balance problems. She has occasional headaches but nothing out of the ordinary today. CT head in the ED reportedly showed no acute abnormality with scattered hypodensities in the white matter of both cerebral hemispheres. MRI of the brain with acute infarct left paracentral pons moderate chronic microvascular ischemic change. MRA of the head  negative carotid Doppler unremarkable 2-D echo EF 55% no wall  motion abnormalities . LDL 137 hemoglobin A1c 5.5 she was switched from aspirin to Plavix for secondary stroke prevention. She returns to the stroke clinic today for follow-up. She remains on Plavix without bruising or bleeding. She is continuing to get some physical therapy for balance. He remains on Lipitor without complaints of myalgias. Speech has improved and she has mild right-sided weakness she returns for reevaluation   REVIEW OF SYSTEMS: Full 14 system review of systems performed and notable only for those listed, all others are neg:  Constitutional: neg  Cardiovascular: neg Ear/Nose/Throat: neg  Skin: neg Eyes: neg Respiratory: neg Gastroitestinal: neg  Hematology/Lymphatic: neg  Endocrine: neg Musculoskeletal: neg Allergy/Immunology: neg Neurological: Weakness Psychiatric: neg Sleep : neg   ALLERGIES: Allergies  Allergen Reactions  . Ace Inhibitors     Cough   . Codeine     Stomach upset     HOME MEDICATIONS: Outpatient Medications Prior to Visit  Medication Sig Dispense Refill  . amLODipine (NORVASC) 10 MG tablet Take 10 mg by mouth daily.    [name] atorvastatin (LIPITOR) 40 MG tablet Take 1 tablet (40 mg total) by mouth daily at 6 PM. 30 tablet 2  . Calcium Carbonate-Vit D-Min (CALCIUM 1200 PO) Take by mouth daily.    . celecoxib (CELEBREX) 200 MG capsule Take 200 mg by mouth daily.    . cholecalciferol (VITAMIN D) 1000 UNITS tablet Take 1,000 Units by mouth daily.    . clopidogrel (PLAVIX) 75 MG tablet Take 1 tablet (75 mg total) by mouth daily. 30 tablet 2  . docusate sodium (COLACE) 100 MG capsule Take 100 mg by mouth daily.    [name]  meclizine (ANTIVERT) 25 MG tablet Take 25 mg by mouth as needed for dizziness.    . metoprolol succinate (TOPROL-XL) 100 MG 24 hr tablet Take 100 mg by mouth daily. Take with or immediately following a meal.    . Omega-3 Fatty Acids (FISH OIL) 1000 MG CAPS Take by mouth  daily.    [name]. omeprazole (PRILOSEC) 40 MG capsule Take 40 mg by mouth daily.    . vitamin C (ASCORBIC ACID) 500 MG tablet Take 500 mg by mouth daily.    . traMADol (ULTRAM) 50 MG tablet Take 1 tablet (50 mg total) by mouth every 6 (six) hours as needed. (Patient not taking: Reported on 07/28/2016) 15 tablet 0  . traZODone (DESYREL) 50 MG tablet Take 50 mg by mouth at bedtime.     No facility-administered medications prior to visit.     PAST MEDICAL HISTORY: Past Medical History:  Diagnosis Date  . Diverticulosis   . H/O Bell's palsy   . Hypertension   . Laryngopharyngeal reflux   . OA (osteoarthritis)   . Osteopenia   . Right sided weakness   . Seasonal allergies     PAST SURGICAL HISTORY: History reviewed. No pertinent surgical history.  FAMILY HISTORY: Family History  Problem Relation Age of Onset  . CVA Mother   . Diabetes Mother   . Osteoarthritis Mother   . Hypertension Mother   . CAD Father   . Heart Problems Brother     stent  . Heart attack Brother     2-3 caths  . Melanoma Paternal Aunt     SOCIAL HISTORY: Social History   Social History  . Marital status: Married    Spouse name: N/A  . Number of children: 3  . Years of education: N/A   Occupational History  . Not on file.   Social History Main Topics  . Smoking status: Former Games developermoker  . Smokeless tobacco: Never Used     Comment: stop smoking when 76 years old   . Alcohol use No  . Drug use: No  . Sexual activity: Not on file   Other Topics Concern  . Not on file   Social History Narrative  . No narrative on file     PHYSICAL EXAM  Vitals:   04/24/16 1004  BP: 138/71   Pulse: 64  Weight: 198 lb 9.6 oz (90.1 kg)  Height: 5\' 2"  (1.575 m)   Body mass index is 36.03 kg/m.  Generalized: Well developed, Obese female in no acute distress  Head: normocephalic and atraumatic,. Oropharynx benign  Neck: Supple, no carotid bruits  Cardiac: Regular rate rhythm, no murmur  Musculoskeletal: No  deformity   Neurological examination   Mentation: Alert oriented to time, place, history taking. Attention span and concentration appropriate. Recent and remote memory intact.  Follows all commands speech and language fluent.   Cranial nerve II-XII: Pupils were equal round reactive to light extraocular movements were full, visual field were full on confrontational test. Mild right facial droop ,Facial sensation  normal. Hearing was intact to finger rubbing bilaterally. Uvula tongue midline. head turning and shoulder shrug were normal and symmetric.Tongue protrusion into cheek strength was normal. Motor: normal bulk and tone, full strength in the BUE, BLE, except mild right upper extremity weakness and right lower extremity weakness.  Sensory: normal and symmetric to light touch, pinprick, and  Vibration, in the upper and lower extremities Coordination: finger-nose-finger, heel-to-shin bilaterally, no dysmetria, no tremor Reflexes: 1+ upper lower and symmetric  plantar responses were flexor bilaterally. Gait and Station: Rising up from seated position without assistance, normal stance,  moderate stride, good arm swing, smooth turning, able to perform tiptoe, and heel walking without difficulty. Tandem gait is unsteady. No assistive device  DIAGNOSTIC DATA (LABS, IMAGING, TESTING) - I reviewed patient records, labs, notes, testing and imaging myself where available.  Lab Results  Component Value Date   WBC 6.8 02/27/2016   HGB 13.0 02/27/2016   HCT 40.1 02/27/2016   MCV 90.1 02/27/2016   PLT 210 02/27/2016      Component Value Date/Time   NA 139 02/27/2016 0637   K 4.0 02/27/2016 0637   CL 106 02/27/2016 0637   CO2 25 02/27/2016 0637   GLUCOSE 98 02/27/2016 0637   BUN 11 02/27/2016 0637   CREATININE 0.64 02/27/2016 0637   CALCIUM 9.4 02/27/2016 0637   PROT 7.8 02/25/2016 0900   ALBUMIN 4.2 02/25/2016 0900   AST 25 02/25/2016 0900   ALT 17 02/25/2016 0900   ALKPHOS 97 02/25/2016 0900    BILITOT 0.6 02/25/2016 0900   GFRNONAA >60 02/27/2016 0637   GFRAA >60 02/27/2016 0637   Lab Results  Component Value Date   CHOL 213 (H) 02/25/2016   HDL 49 02/25/2016   LDLCALC 137 (H) 02/25/2016   TRIG 137 02/25/2016   CHOLHDL 4.3 02/25/2016   Lab Results  Component Value Date   HGBA1C 5.5 02/25/2016    ASSESSMENT AND PLAN  75 y.o. year old female  has a past medical history Hypertension;  Right sided weakness; here  for follow-up of stroke,  acute infarct left paracentral pons moderate chronic microvascular ischemic change. The patient is a current patient of Dr. Roda Shutters  who is out of the office today . This note is sent to the work in doctor.      PLAN: Stressed the importance of continued management of risk factors to prevent further stroke Continue Plavix for secondary stroke prevention Maintain strict control of hypertension with blood pressure goal below 130/90, today's reading 138/71 continue antihypertensive medications Cholesterol with LDL cholesterol less than 70, followed by primary care,  continue Lipitor Continue exercise by walking, , eat healthy diet with whole grains,  fresh fruits and vegetables, use cane if  unsteady Follow up in 6 months I spent 25 min  in total face to face time with the patient more than 50% of which was spent counseling and coordination of care, reviewing test results reviewing medications and discussing and reviewing the diagnosis of stroke and stroke prevention.  Nilda Riggs, [facility], [facility], APRN  [facility] [address] [place], [place] [address] [phone]

## 2016-07-31 NOTE — Progress Notes (Signed)
I have reviewed and agreed above plan. 

## 2016-08-31 ENCOUNTER — Other Ambulatory Visit: Payer: Self-pay

## 2016-08-31 DIAGNOSIS — I70219 Atherosclerosis of native arteries of extremities with intermittent claudication, unspecified extremity: Secondary | ICD-10-CM

## 2016-10-13 ENCOUNTER — Encounter: Payer: Self-pay | Admitting: Vascular Surgery

## 2016-10-24 ENCOUNTER — Ambulatory Visit (HOSPITAL_COMMUNITY)
Admission: RE | Admit: 2016-10-24 | Discharge: 2016-10-24 | Disposition: A | Discharge status: Home / Self Care | Payer: Medicare Other | Source: Ambulatory Visit | Attending: Vascular Surgery | Admitting: Vascular Surgery

## 2016-10-24 ENCOUNTER — Ambulatory Visit (INDEPENDENT_AMBULATORY_CARE_PROVIDER_SITE_OTHER): Payer: Medicare Other | Admitting: Vascular Surgery

## 2016-10-24 ENCOUNTER — Encounter: Payer: Self-pay | Admitting: Vascular Surgery

## 2016-10-24 VITALS — BP 152/82 | HR 61 | Temp 98.4°F | Resp 20 | Ht 62.0 in | Wt 199.0 lb

## 2016-10-24 DIAGNOSIS — I70219 Atherosclerosis of native arteries of extremities with intermittent claudication, unspecified extremity: Secondary | ICD-10-CM

## 2016-10-24 DIAGNOSIS — E785 Hyperlipidemia, unspecified: Secondary | ICD-10-CM | POA: Insufficient documentation

## 2016-10-24 DIAGNOSIS — R200 Anesthesia of skin: Secondary | ICD-10-CM

## 2016-10-24 DIAGNOSIS — I10 Essential (primary) hypertension: Secondary | ICD-10-CM | POA: Insufficient documentation

## 2016-10-24 DIAGNOSIS — R0989 Other specified symptoms and signs involving the circulatory and respiratory systems: Secondary | ICD-10-CM | POA: Diagnosis present

## 2016-10-24 NOTE — Progress Notes (Signed)
[facility] [place]  Patient name: Kristen Barry MRN: [id number] DOB: [birth year] Sex: female  REASON FOR CONSULT: Evaluation of right greater than left lower extremity symptoms.  HPI: Kristen Barry is a 76 y.o. female, who is seen today for evaluation. She is status post stroke approximately one year ago. This affected her right side and speech. She has had very nice recovery from this. Reviewed her studies from that admission and she had no evidence of carotid disease at that time. She presents today with the sensation that her right foot is "going to sleep" this occurs when she is standing and also can occur when she is walking. She rarely has this in her left foot. She reports occasionally she can have some discomfort with the numbness in her buttock as well. No history of tissue loss and no true muscle claudication type symptoms.  Past Medical History:  Diagnosis Date  . Diverticulosis   . H/O Bell's palsy   . Hypertension   . Laryngopharyngeal reflux   . OA (osteoarthritis)   . Osteopenia   . Right sided weakness   . Seasonal allergies     Family History  Problem Relation Age of Onset  . CVA Mother   . Diabetes Mother   . Osteoarthritis Mother   . Hypertension Mother   . CAD Father   . Heart Problems Brother        stent  . Heart attack Brother        2-3 caths  . Melanoma Paternal Aunt     SOCIAL HISTORY: Social History   Social History  . Marital status: Married    Spouse name: N/A  . Number of children: 3  . Years of education: N/A   Occupational History  . Not on file.   Social History Main Topics  . Smoking status: Former Games developer  . Smokeless tobacco: Never Used     Comment: stop smoking when 76 years old   . Alcohol use No  . Drug use: No  . Sexual activity: Not on file   Other Topics Concern  . Not on file   Social History Narrative  . No narrative on file    Allergies  Allergen  Reactions  . Ace Inhibitors     Cough   . Codeine     Stomach upset     Current Outpatient Prescriptions  Medication Sig Dispense Refill  . amLODipine (NORVASC) 10 MG tablet Take 10 mg by mouth daily.    [name] atorvastatin (LIPITOR) 40 MG tablet Take 1 tablet (40 mg total) by mouth daily at 6 PM. 30 tablet 2  . Calcium Carbonate-Vit D-Min (CALCIUM 1200 PO) Take by mouth daily.    . celecoxib (CELEBREX) 200 MG capsule Take 200 mg by mouth daily.    . cholecalciferol (VITAMIN D) 1000 UNITS tablet Take 1,000 Units by mouth daily.    . clopidogrel (PLAVIX) 75 MG tablet Take 1 tablet (75 mg total) by mouth daily. 30 tablet 2  . docusate sodium (COLACE) 100 MG capsule Take 100 mg by mouth daily.    [name] LOSARTAN POTASSIUM PO Take 25 mg by mouth at bedtime.    . meclizine (ANTIVERT) 25 MG tablet Take 25 mg by mouth as needed for dizziness.    . metoprolol succinate (TOPROL-XL) 100 MG 24 hr tablet Take 100 mg by mouth daily. Take with or immediately following a meal.    . omega-3 acid ethyl esters (LOVAZA)  1 g capsule Take by mouth.    . Omega-3 Fatty Acids (FISH OIL) 1000 MG CAPS Take by mouth daily.    [name]. omeprazole (PRILOSEC) 40 MG capsule Take 40 mg by mouth daily.    . vitamin C (ASCORBIC ACID) 500 MG tablet Take 500 mg by mouth daily.     No current facility-administered medications for this visit.     REVIEW OF SYSTEMS:  [X]  denotes positive finding, [ ]  denotes negative finding Cardiac  Comments:  Chest pain or chest pressure:    Shortness of breath upon exertion:    Short of breath when lying flat:    Irregular heart rhythm:        Vascular    Pain in calf, thigh, or hip brought on by ambulation:    Pain in feet at night that wakes you up from your sleep:  x   Blood clot in your veins:    Leg swelling:  x       Pulmonary    Oxygen at home:    Productive cough:     Wheezing:         Neurologic    Sudden weakness in arms or legs:     Sudden numbness in arms or legs:  x     Sudden onset of difficulty speaking or slurred speech:    Temporary loss of vision in one eye:     Problems with dizziness:         Gastrointestinal    Blood in stool:     Vomited blood:         Genitourinary    Burning when urinating:     Blood in urine:        Psychiatric    Major depression:         Hematologic    Bleeding problems:    Problems with blood clotting too easily:        Skin    Rashes or ulcers:        Constitutional    Fever or chills:      PHYSICAL EXAM: Vitals:   10/24/16 1011  BP: (!) 152/82  Pulse: 61  Resp: 20  Temp: 98.4 F (36.9 C)  TempSrc: Oral  SpO2: 94%  Weight: 199 lb (90.3 kg)  Height: 5\' 2"  (1.575 m)    GENERAL: The patient is a well-nourished female, in no acute distress. The vital signs are documented above. CARDIOVASCULAR: 2+ radial 2+ dorsalis pedis and 2+ posterior tibial pulses bilaterally. No carotid bruits noted bilaterally PULMONARY: There is good air exchange  ABDOMEN: Soft and non-tender . No bruits noted MUSCULOSKELETAL: There are no major deformities or cyanosis. NEUROLOGIC: No focal weakness or paresthesias are detected. SKIN: There are no ulcers or rashes noted. PSYCHIATRIC: The patient has a normal affect.  DATA:  Noninvasive studies in our office today reveals normal ankle arm index bilaterally laterally and normal triphasic waveforms in her dorsalis pedis and posterior tibial bilaterally  MEDICAL ISSUES: Discuss these findings at length with the patient. Explained that she does not have any evidence of arterial insufficiency causing her symptoms. This does seem to be more neurologic. Potentially related to her old stroke or even sciatic discomfort. She will follow-up with us on an as-needed basis discuss this further with her primary care provider   Larina Earthlyodd F. [place] Kretz, MD [facility] Tel [phone]) [phone] Pager [phone]) [phone]

## 2017-01-12 ENCOUNTER — Encounter (INDEPENDENT_AMBULATORY_CARE_PROVIDER_SITE_OTHER): Payer: Self-pay

## 2017-01-12 ENCOUNTER — Ambulatory Visit (INDEPENDENT_AMBULATORY_CARE_PROVIDER_SITE_OTHER): Payer: Medicare Other | Admitting: Neurology

## 2017-01-12 DIAGNOSIS — R202 Paresthesia of skin: Secondary | ICD-10-CM | POA: Diagnosis not present

## 2017-01-12 DIAGNOSIS — Z0289 Encounter for other administrative examinations: Secondary | ICD-10-CM

## 2017-01-12 NOTE — Procedures (Signed)
Full Name: Kristen Barry Gender: Female MRN #: [id number] Date of Birth: 1941/05/18    Visit Date: 01/12/2017 12:20 Age: 76 Years 5 Months Old Examining Physician: Levert Feinstein, MD  Referring Physician: [place] Capri, MD History: 76 year old female, had left paramedial pontine stroke in September 2017, she had right upper and lower extremity weakness, numbness from her stroke, now she had intermittent right lower extremity paresthesia, muscle spasm.  Summary of the test: Nerve conduction study: Bilateral lower extremity motor and sensory nerve conduction studies were normal.  Electromyography: Selective needle examination of bilateral lower extremity and bilateral lumbar sacral paraspinal muscles were normal.  Conclusion: This is a normal study. There is no electrodiagnostic evidence of large fiber peripheral neuropathy, or bilateral lumbar sacral radiculopathy.    ------------------------------- Levert Feinstein M.D.  [facility] [address] [place], [place] [address] Tel: [phone] Fax: [phone]        [facility]    Nerve / Sites Muscle Latency Ref. Amplitude Ref. Rel Amp Segments Distance Velocity Ref. Area    ms ms mV mV %  cm m/s m/s mVms  R Peroneal - EDB     Ankle EDB 4.2 ?6.5 5.3 ?2.0 100 Ankle - EDB 9   11.9     Fib head EDB 9.9  4.5  85.3 Fib head - Ankle 27 47 ?44 11.6     Pop fossa EDB 11.9  4.8  106 Pop fossa - Fib head 10 51 ?44 13.1         Pop fossa - Ankle      L Peroneal - EDB     Ankle EDB 4.4 ?6.5 5.8 ?2.0 100 Ankle - EDB 9   12.1     Fib head EDB 10.2  4.8  83.2 Fib head - Ankle 27 47 ?44 10.9     Pop fossa EDB 12.2  4.5  93.5 Pop fossa - Fib head 10 49 ?44 10.4         Pop fossa - Ankle      R Tibial - AH     Ankle AH 3.9 ?5.8 6.2 ?4.0 100 Ankle - AH 9   17.8     Pop fossa AH 12.5  5.3  84.8 Pop fossa - Ankle 35 41 ?41 13.9  L Tibial - AH     Ankle AH 4.0 ?5.8 7.7 ?4.0 100 Ankle - AH 9   20.0     Pop fossa AH 12.4  5.6  73.3 Pop  fossa - Ankle 35 41 ?41 16.0             SNC    Nerve / Sites Rec. Site Peak Lat Ref.  Amp Ref. Segments Distance    ms ms V V  cm  R Sural - Ankle (Calf)     Calf Ankle 3.4 ?4.4 6 ?6 Calf - Ankle 14  L Sural - Ankle (Calf)     Calf Ankle 3.5 ?4.4 7 ?6 Calf - Ankle 14  R Superficial peroneal - Ankle     Lat leg Ankle 3.2 ?4.4 6 ?6 Lat leg - Ankle 14  L Superficial peroneal - Ankle     Lat leg Ankle 3.4 ?4.4 7 ?6 Lat leg - Ankle 14             F  Wave    Nerve F Lat Ref.   ms ms  R Tibial - AH 50.1 ?56.0  L Tibial -  AH 50.7 ?56.0         EMG full       EMG Summary Table    Spontaneous MUAP Recruitment  Muscle IA Fib PSW Fasc Other Amp Dur. Poly Pattern  R. Tibialis anterior Normal None None None _______ Normal Normal Normal Normal  R. Tibialis posterior Normal None None None _______ Normal Normal Normal Normal  R. Gastrocnemius (Medial head) Normal None None None _______ Normal Normal Normal Normal  R. Vastus lateralis Normal None None None _______ Normal Normal Normal Normal  R. Peroneus longus Normal None None None _______ Normal Normal Normal Normal  L. Tibialis anterior Normal None None None _______ Normal Normal Normal Normal  L. Tibialis posterior Normal None None None _______ Normal Normal Normal Normal  L. Vastus lateralis Normal None None None _______ Normal Normal Normal Normal  R. Lumbar paraspinals (mid) Normal None None None _______ Normal Normal Normal Normal  R. Lumbar paraspinals (low) Normal None None None _______ Normal Normal Normal Normal  L. Lumbar paraspinals (mid) Normal None None None _______ Normal Normal Normal Normal  L. Lumbar paraspinals (low) Normal None None None _______ Normal Normal Normal Normal

## 2017-01-31 NOTE — Progress Notes (Signed)
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 Kristen Barry, 76 year old female returns for follow-up with history of stroke in September 2017. She remains on Plavix for secondary stroke prevention without further stroke or TIA symptoms. No bruising and no bleeding Blood pressure in the office today 146/75. She continues to complain of right leg weakness and right foot swelling. She had an EMG nerve conduction done by Dr. Terrace Arabia which was normal. She has also seen Dr. Arbie Cookey who did not feel she had any evidence of arterial insufficiency causing her symptoms. She remains on Lipitor. Reviewed recent labs 12/21/16 from primary care CBC within normal limits CMP within normal limits. Cholesterol 112 LDL 44 triglycerides 124. She claims that time of her right foot swells but goes down at night when sleeping. When she is walking for any extended period of time she feels like her right leg is going to give out. She complains with some muscle cramps. She continues to walk with a cane. She continues to do some of her exercises from her home exercise program. She returns for reevaluation   UPDATE 07/28/16 CM Ms. 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 She is currently on Plavix for secondary stroke prevention without further stroke or TIA symptoms. She has no bruising and no bleeding. In addition she is on Lipitor for hyperlipidemia and she denies any muscle aches or myalgias. Blood pressure in the office today 138/71. Losartan has been added to her regimen. She continues to complain with some slurring of speech particularly when she is fatigued. She occasionally exercises by walking. Her  physical therapy has concluded. She continues to have mild right-sided weakness. She has not had any falls.  She returns for reevaluation    HISTORY 04/24/16 CM75-yo RH woman who presents to the ED reporting slurred speech and right-sided weakness that started yesterday morning 02/25/16. Kristen Barry History is obtained directly from the patient who is an excellent historian. She states that she first noted some numbness in her right foot at about 0700 yesterday. This was associated with weakness of the right arm and leg as well as slurred speech. Her symptoms have persisted unchanged. She did not appreciate any symptoms in her face. She denies vision loss, difficulty swallowing, vertigo, double vision, discoordination, or balance problems. She has occasional headaches but nothing out of the ordinary today. CT head in the ED reportedly showed no acute abnormality with scattered hypodensities in the white matter of both cerebral hemispheres. MRI of the brain with acute infarct left paracentral pons moderate chronic microvascular ischemic change. MRA of the head negative carotid Doppler unremarkable 2-D echo EF 55% no wall  motion abnormalities . LDL 137 hemoglobin A1c 5.5 she was switched from aspirin to Plavix for secondary stroke prevention. She returns to the stroke clinic today for follow-up. She remains on Plavix without bruising or bleeding. She is continuing to get some physical therapy for balance. He remains on Lipitor without complaints of myalgias. Speech has improved and she has mild right-sided weakness she returns for reevaluation   REVIEW OF SYSTEMS: Full 14 system review of systems performed and notable only for those listed, all others are neg:  Constitutional: neg  Cardiovascular: neg Ear/Nose/Throat: neg  Skin: neg  Eyes: neg Respiratory: neg Gastroitestinal: neg  Hematology/Lymphatic: neg  Endocrine: neg Musculoskeletal: Muscle cramps Allergy/Immunology: neg Neurological:  Weakness Psychiatric: neg Sleep : neg   ALLERGIES: Allergies  Allergen Reactions  . Ace Inhibitors     Cough   . Codeine     Stomach upset     HOME MEDICATIONS: Outpatient Medications Prior to Visit  Medication Sig Dispense Refill  . amLODipine (NORVASC) 10 MG tablet Take 10 mg by mouth daily.    Kristen Barry atorvastatin (LIPITOR) 40 MG tablet Take 1 tablet (40 mg total) by mouth daily at 6 PM. 30 tablet 2  . Calcium Carbonate-Vit D-Min (CALCIUM 1200 PO) Take by mouth daily.    . celecoxib (CELEBREX) 200 MG capsule Take 200 mg by mouth daily.    . cholecalciferol (VITAMIN D) 1000 UNITS tablet Take 1,000 Units by mouth daily.    . clopidogrel (PLAVIX) 75 MG tablet Take 1 tablet (75 mg total) by mouth daily. 30 tablet 2  . docusate sodium (COLACE) 100 MG capsule Take 100 mg by mouth daily.    Kristen Barry LOSARTAN POTASSIUM PO Take 25 mg by mouth at bedtime.    . meclizine (ANTIVERT) 25 MG tablet Take 25 mg by mouth as needed for dizziness.    . metoprolol succinate (TOPROL-XL) 100 MG 24 hr tablet Take 100 mg by mouth daily. Take with or immediately following a meal.    . Omega-3 Fatty Acids (FISH OIL) 1000 MG CAPS Take by mouth daily.    Kristen Barry omeprazole (PRILOSEC) 40 MG capsule Take 40 mg by mouth daily.    . vitamin C (ASCORBIC ACID) 500 MG tablet Take 500 mg by mouth daily.    Kristen Barry omega-3 acid ethyl esters (LOVAZA) 1 g capsule Take by mouth.     No facility-administered medications prior to visit.     PAST MEDICAL HISTORY: Past Medical History:  Diagnosis Date  . Diverticulosis   . H/O Bell's palsy   . Hypertension   . Laryngopharyngeal reflux   . OA (osteoarthritis)   . Osteopenia   . Right sided weakness   . Seasonal allergies   . Stroke [facility])     PAST SURGICAL HISTORY: History reviewed. No pertinent surgical history.  FAMILY HISTORY: Family History  Problem Relation Age of Onset  . CVA Mother   . Diabetes Mother   . Osteoarthritis Mother   . Hypertension Mother   . CAD Father    . Heart Problems Brother        stent  . Heart attack Brother        2-3 caths  . Melanoma Paternal Aunt     SOCIAL HISTORY: Social History   Social History  . Marital status: Married    Spouse name: N/A  . Number of children: 3  . Years of education: N/A   Occupational History  . Not on file.   Social History Main Topics  . Smoking status: Former Games developer  . Smokeless tobacco: Never Used     Comment: stop smoking when 76 years old   . Alcohol use No  . Drug use: No  . Sexual activity: Not on file   Other Topics Concern  . Not on file   Social History Narrative   Lives with husband     PHYSICAL EXAM  Vitals:   04/24/16 1004  BP: 138/71   Pulse: 64  Weight: 198 lb 9.6 oz (90.1 kg)  Height: 5\' 2"  (1.575 m)   Body mass index is  35.56 kg/m.  Generalized: Well developed, Obese female in no acute distress  Head: normocephalic and atraumatic,. Oropharynx benign  Neck: Supple, no carotid bruits  Cardiac: Regular rate rhythm, no murmur  Musculoskeletal: No deformity Skin no peripheral edema   Neurological examination   Mentation: Alert oriented to time, place, history taking. Attention span and concentration appropriate. Recent and remote memory intact.  Follows all commands speech and language fluent.   Cranial nerve II-XII: Pupils were equal round reactive to light extraocular movements were full, visual field were full on confrontational test. Mild right facial droop ,Facial sensation  normal. Hearing was intact to finger rubbing bilaterally. Uvula tongue midline. head turning and shoulder shrug were normal and symmetric.Tongue protrusion into cheek strength was normal. Motor: normal bulk and tone, full strength in the BUE, BLE,   Sensory: normal and symmetric to light touch, pinprick, and  Vibration, in the upper and lower extremities  Coordination: finger-nose-finger, heel-to-shin bilaterally, no dysmetria, no tremor Reflexes: 1+ upper lower and symmetric  plantar responses were flexor bilaterally. Gait and Station: Rising up from seated position without assistance, normal stance,  moderate stride, good arm swing, smooth turning, able to perform tiptoe, and heel walking without difficulty. Tandem gait is unsteady. Ambulates with single-point cane  DIAGNOSTIC DATA (LABS, IMAGING, TESTING) - I reviewed patient records, labs, notes, testing and imaging myself where available.  Lab Results  Component Value Date   WBC 6.8 02/27/2016   HGB 13.0 02/27/2016   HCT 40.1 02/27/2016   MCV 90.1 02/27/2016   PLT 210 02/27/2016      Component Value Date/Time   NA 139 02/27/2016 0637   K 4.0 02/27/2016 0637   CL 106 02/27/2016 0637   CO2 25 02/27/2016 0637   GLUCOSE 98 02/27/2016 0637   BUN 11 02/27/2016 0637   CREATININE 0.64 02/27/2016 0637   CALCIUM 9.4 02/27/2016 0637   PROT 7.8 02/25/2016 0900   ALBUMIN 4.2 02/25/2016 0900   AST 25 02/25/2016 0900   ALT 17 02/25/2016 0900   ALKPHOS 97 02/25/2016 0900   BILITOT 0.6 02/25/2016 0900   GFRNONAA >60 02/27/2016 0637   GFRAA >60 02/27/2016 0637   Lab Results  Component Value Date   CHOL 213 (H) 02/25/2016   HDL 49 02/25/2016   LDLCALC 137 (H) 02/25/2016   TRIG 137 02/25/2016   CHOLHDL 4.3 02/25/2016   Lab Results  Component Value Date   HGBA1C 5.5 02/25/2016    ASSESSMENT AND PLAN  76 y.o. year old female  has a past medical history Hypertension;  Right sided weakness; here  for follow-up of stroke,  acute infarct left paracentral pons moderate chronic microvascular ischemic change. Reviewed recent labs at primary care 12/21/2016 CBC within normal limits CMP within normal limits. Cholesterol 112 LDL 44 triglycerides 124.The patient is a current patient of Dr. Roda ShuttersXu  who is out of the office today . This note is sent to the work in doctor   PLAN: Stressed the importance of continued management of risk factors to prevent further stroke Continue Plavix for secondary stroke  prevention Maintain strict control of hypertension with blood pressure goal below 130/90, today's reading 146/75 continue antihypertensive medications Cholesterol with LDL cholesterol less than 70, followed by primary care,  myalgias from Lipitor , stop statin for  one month to see if this improves CK today Continue exercise by walking, , use cane at all times  eat healthy diet with whole grains,  fresh fruits and vegetables,  Follow up  in 6 months I spent 25 min  in total face to face time with the patient more than 50% of which was spent counseling and coordination of care, reviewing test results reviewing medications and discussing and reviewing the diagnosis of stroke and stroke prevention. Also discussed staying off of Lipitor for 1 month to see if muscle cramping improves. She will follow up with primary care regarding this Nilda Riggs, [facility], [facility], APRN  [facility] [address] [place], [place] [address] [phone]

## 2017-02-01 ENCOUNTER — Ambulatory Visit (INDEPENDENT_AMBULATORY_CARE_PROVIDER_SITE_OTHER): Payer: Medicare Other | Admitting: Nurse Practitioner

## 2017-02-01 ENCOUNTER — Encounter: Payer: Self-pay | Admitting: Nurse Practitioner

## 2017-02-01 VITALS — BP 146/75 | HR 58 | Wt 194.4 lb

## 2017-02-01 DIAGNOSIS — I10 Essential (primary) hypertension: Secondary | ICD-10-CM | POA: Diagnosis not present

## 2017-02-01 DIAGNOSIS — E785 Hyperlipidemia, unspecified: Secondary | ICD-10-CM | POA: Insufficient documentation

## 2017-02-01 DIAGNOSIS — I639 Cerebral infarction, unspecified: Secondary | ICD-10-CM

## 2017-02-01 DIAGNOSIS — M6281 Muscle weakness (generalized): Secondary | ICD-10-CM | POA: Diagnosis not present

## 2017-02-01 NOTE — Patient Instructions (Signed)
Stressed the importance of continued management of risk factors to prevent further stroke Continue Plavix for secondary stroke prevention Maintain strict control of hypertension with blood pressure goal below 130/90, today's reading 146/75 continue antihypertensive medications Cholesterol with LDL cholesterol less than 70, followed by primary care,  myalgias from Lipitor , stop statin for  one month to see if this improves CK today Continue exercise by walking, , use cane at all times  eat healthy diet with whole grains,  fresh fruits and vegetables,  Follow up in 6 months

## 2017-02-02 LAB — CK: Total CK: 50 U/L (ref 24–173)

## 2017-02-02 NOTE — Progress Notes (Signed)
I have read the note, and I agree with the clinical assessment and plan.  Richard A. Sater, MD, PhD, FAAN Certified in Neurology, Clinical Neurophysiology, Sleep Medicine, Pain Medicine and Neuroimaging  [facility] [address], [address] [place], [place] [address] [phone]  

## 2017-02-05 ENCOUNTER — Telehealth: Payer: Self-pay | Admitting: *Deleted

## 2017-02-05 NOTE — Telephone Encounter (Signed)
Spoke with patient and informed her that her lab was within normal limits. She verba;lized understanding, appreciation.

## 2017-04-17 ENCOUNTER — Encounter: Payer: Self-pay | Admitting: Cardiovascular Disease

## 2017-04-17 ENCOUNTER — Ambulatory Visit: Payer: Medicare Other | Admitting: Cardiovascular Disease

## 2017-04-17 VITALS — BP 130/60 | HR 62 | Ht 62.0 in | Wt 197.2 lb

## 2017-04-17 DIAGNOSIS — I5032 Chronic diastolic (congestive) heart failure: Secondary | ICD-10-CM

## 2017-04-17 DIAGNOSIS — R0609 Other forms of dyspnea: Secondary | ICD-10-CM | POA: Diagnosis not present

## 2017-04-17 NOTE — Progress Notes (Signed)
Cardiology Office Note:    Date:  04/17/2017   ID:  Kristen OldenNancy C Allard, DOB September 05, 1940, MRN [id number]  PCP:  Lenell AntuLe, Thao P, DO  Cardiologist:  Moss McNelson ,   Estelene Carmack, MD    Referring MD: Lenell AntuLe, Thao P, DO   Problem list 1.  Essential hypertension 2.  CVA   Chief Complaint  Patient presents with  . Hypertension    History of Present Illness:    Kristen Barry is a 76 y.o. female with a hx of essential hypertension. She is the wife of Jacqulynn CadetCurtis Backs ( a patient of mine)  Has had a CVA  Now has some right  leg pain , numbness  Is going to see ortho next week .   Is having more shortness of breath with exertion Used to walk a mile a day before her stroke.  Now she gets very short of breath even walking one quarter of a mile.  She does walk a quarter of a mile about 3 times every day.  Echocardiogram in October, 2017 shows normal left ventricular systolic function.  She has grade 1 diastolic dysfunction.  There is trivial aortic insufficiency   Past Medical History:  Diagnosis Date  . Abnormality of gait 04/24/2016  . Carpal tunnel syndrome   . Cerebral artery occlusion with cerebral infarction (HCC)   . CVA (cerebral infarction) 02/25/2016  . Diverticulosis   . Environmental allergies   . GERD (gastroesophageal reflux disease)   . H/O Bell's palsy   . Hypertension   . Insomnia   . Laryngopharyngeal reflux   . Numbness in right leg   . OA (osteoarthritis)   . Obesity 02/25/2016  . Osteoarthritis   . Osteoarthrosis   . Osteopenia   . PAD (peripheral artery disease) (HCC)   . Paresthesia of skin   . Right sided weakness   . Seasonal allergies   . Slurred speech 02/25/2016  . SOB (shortness of breath)   . Stroke [facility](HCC)     History reviewed. No pertinent surgical history.  Current Medications: Current Meds  Medication Sig  . amLODipine (NORVASC) 10 MG tablet Take 10 mg by mouth daily.  . Calcium Carbonate-Vit D-Min (CALCIUM 1200 PO) Take by mouth daily.  . celecoxib  (CELEBREX) 200 MG capsule Take 200 mg by mouth daily.  . cholecalciferol (VITAMIN D) 1000 UNITS tablet Take 1,000 Units by mouth daily.  . clopidogrel (PLAVIX) 75 MG tablet Take 1 tablet (75 mg total) by mouth daily.  [name]. docusate sodium (COLACE) 100 MG capsule Take 100 mg by mouth daily.  [name]. LOSARTAN POTASSIUM PO Take 25 mg by mouth at bedtime.  . meclizine (ANTIVERT) 25 MG tablet Take 25 mg by mouth as needed for dizziness.  . metoprolol succinate (TOPROL-XL) 100 MG 24 hr tablet Take 100 mg by mouth daily. Take with or immediately following a meal.  . Omega-3 Fatty Acids (FISH OIL) 1000 MG CAPS Take by mouth daily.  [name]. omeprazole (PRILOSEC) 40 MG capsule Take 40 mg by mouth daily.  . rosuvastatin (CRESTOR) 10 MG tablet Take 10 mg by mouth daily.  . vitamin C (ASCORBIC ACID) 500 MG tablet Take 500 mg by mouth daily.     Allergies:   Ace inhibitors and Codeine   Social History   Socioeconomic History  . Marital status: Married    Spouse name: None  . Number of children: 3  . Years of education: None  . Highest education level: None  Social Needs  .  Financial resource strain: None  . Food insecurity - worry: None  . Food insecurity - inability: None  . Transportation needs - medical: None  . Transportation needs - non-medical: None  Occupational History  . None  Tobacco Use  . Smoking status: Former Games developer  . Smokeless tobacco: Never Used  . Tobacco comment: stop smoking when 76 years old   Substance and Sexual Activity  . Alcohol use: No  . Drug use: No  . Sexual activity: None  Other Topics Concern  . None  Social History Narrative   Lives with husband     Family History: The patient's family history includes CAD in her father; CVA in her mother; Diabetes in her mother; Heart Problems in her brother; Heart attack in her brother; Hypertension in her mother; Melanoma in her paternal aunt; Osteoarthritis in her mother. ROS:   Please see the history of present illness.      All other systems reviewed and are negative.  EKGs/Labs/Other Studies Reviewed:    The following studies were reviewed today:   EKG:  EKG is  ordered today.  The ekg ordered today demonstrates sinus brady at 56.   minamil voltage criteria for LVH   Recent Labs: No results found for requested labs within last 8760 hours.  Recent Lipid Panel    Component Value Date/Time   CHOL 213 (H) 02/25/2016 0951   TRIG 137 02/25/2016 0951   HDL 49 02/25/2016 0951   CHOLHDL 4.3 02/25/2016 0951   VLDL 27 02/25/2016 0951   LDLCALC 137 (H) 02/25/2016 0951    Physical Exam:    VS:  BP 130/60   Pulse 62   Ht 5\' 2"  (1.575 m)   Wt 197 lb 3.2 oz (89.4 kg)   SpO2 95%   BMI 36.07 kg/m     Wt Readings from Last 3 Encounters:  04/17/17 197 lb 3.2 oz (89.4 kg)  02/01/17 194 lb 6.4 oz (88.2 kg)  10/24/16 199 lb (90.3 kg)     GEN:  Elderly female  acute distress HEENT: Normal NECK: No JVD; No carotid bruits LYMPHATICS: No lymphadenopathy CARDIAC: RR, no murmurs, rubs, gallops RESPIRATORY:  Clear to auscultation without rales, wheezing or rhonchi  ABDOMEN: Soft, non-tender, non-distended MUSCULOSKELETAL:  No edema; No deformity  SKIN: Warm and dry NEUROLOGIC:  Alert and oriented x 3 PSYCHIATRIC:  Normal affect   ASSESSMENT:    No diagnosis found. PLAN:    In order of problems listed above:  1. Essential hypertension:   blood pressure is well controlled.  Continue current medications.   2. Shortness of breath with exertion: Kristen Barry has known chronic diastolic congestive heart failure.  She now gets out of breath when she walks for any length of time.  She is to be up to walk a mile a day but now can only walk one quarter of a mile.  I suspect that this is mostly due to her generalized deconditioning on top of chronic diastolic congestive heart failure.  I advised her to start walking on a regular basis.  She has up an appointment with the orthopedic doctor next week.  It would be important  for [place] to make sure that she does not have some sort of the back or spinal injury.  We will get a Lexiscan Myoview study for further evaluation of the shortness of breath with exertion.    Medication Adjustments/Labs and Tests Ordered: Current medicines are reviewed at length with the patient today.  Concerns regarding  medicines are outlined above.  No orders of the defined types were placed in this encounter.  No orders of the defined types were placed in this encounter.   Signed, Kristeen MissPhilip Matie Dimaano, MD  04/17/2017 3:17 PM    [facility]

## 2017-04-17 NOTE — Patient Instructions (Signed)
Medication Instructions:  Your physician recommends that you continue on your current medications as directed. Please refer to the Current Medication list given to you today.   Labwork: None Ordered   Testing/Procedures: Your physician has requested that you have a lexiscan myoview. For further information please visit [email or web address]. Please follow instruction sheet, as given.    Follow-Up: Your physician recommends that you schedule a follow-up appointment in: 3 months with Dr. Nahser   If you need a refill on your cardiac medications before your next appointment, please call your pharmacy.   Thank you for choosing [facility]! Mia Milan, RN [phone]    

## 2017-04-23 ENCOUNTER — Telehealth (HOSPITAL_COMMUNITY): Payer: Self-pay | Admitting: *Deleted

## 2017-04-23 NOTE — Telephone Encounter (Signed)
Left message on voicemail per DPR in reference to upcoming appointment scheduled on 04/24/17 with detailed instructions given per Myocardial Perfusion Study Information Sheet for the test. LM to arrive 15 minutes early, and that it is imperative to arrive on time for appointment to keep from having the test rescheduled. If you need to cancel or reschedule your appointment, please call the office within 24 hours of your appointment. Failure to do so may result in a cancellation of your appointment, and a $50 no show fee. Phone number given for call back for any questions. Ricky AlaSmith, Abhay Godbolt Jacqueline, RN

## 2017-04-24 ENCOUNTER — Ambulatory Visit (HOSPITAL_COMMUNITY): Payer: Medicare Other | Attending: Cardiology

## 2017-04-24 DIAGNOSIS — I5032 Chronic diastolic (congestive) heart failure: Secondary | ICD-10-CM | POA: Insufficient documentation

## 2017-04-24 DIAGNOSIS — R0609 Other forms of dyspnea: Secondary | ICD-10-CM | POA: Diagnosis present

## 2017-04-24 DIAGNOSIS — I4510 Unspecified right bundle-branch block: Secondary | ICD-10-CM | POA: Diagnosis not present

## 2017-04-24 LAB — MYOCARDIAL PERFUSION IMAGING
CHL CUP NUCLEAR SSS: 12
CSEPPHR: 76 {beats}/min
LV sys vol: 22 mL
LVDIAVOL: 72 mL (ref 46–106)
NUC STRESS TID: 0.96
RATE: 0.33
Rest HR: 54 {beats}/min
SDS: 6
SRS: 6

## 2017-04-24 MED ORDER — TECHNETIUM TC 99M TETROFOSMIN IV KIT
10.1000 | PACK | Freq: Once | INTRAVENOUS | Status: AC | PRN
  Administered 2017-04-24: 10.1 via INTRAVENOUS
  Filled 2017-04-24: qty 11

## 2017-04-24 MED ORDER — TECHNETIUM TC 99M TETROFOSMIN IV KIT
30.6000 | PACK | Freq: Once | INTRAVENOUS | Status: AC | PRN
  Administered 2017-04-24: 30.6 via INTRAVENOUS
  Filled 2017-04-24: qty 31

## 2017-04-24 MED ORDER — REGADENOSON 0.4 MG/5ML IV SOLN
0.4000 mg | Freq: Once | INTRAVENOUS | Status: AC
  Administered 2017-04-24: 0.4 mg via INTRAVENOUS

## 2017-07-23 ENCOUNTER — Ambulatory Visit: Payer: Medicare Other | Admitting: Cardiovascular Disease

## 2017-07-23 ENCOUNTER — Encounter (INDEPENDENT_AMBULATORY_CARE_PROVIDER_SITE_OTHER): Payer: Self-pay

## 2017-07-23 ENCOUNTER — Encounter: Payer: Self-pay | Admitting: Cardiovascular Disease

## 2017-07-23 VITALS — BP 128/58 | HR 55 | Ht 63.0 in | Wt 206.4 lb

## 2017-07-23 DIAGNOSIS — R0609 Other forms of dyspnea: Secondary | ICD-10-CM | POA: Diagnosis not present

## 2017-07-23 DIAGNOSIS — I10 Essential (primary) hypertension: Secondary | ICD-10-CM

## 2017-07-23 NOTE — Progress Notes (Signed)
Cardiology Office Note:    Date:  07/23/2017   ID:  Kristen Barry, DOB 01/29/1941, MRN [id number]  PCP:  Lenell Antu, DO  Cardiologist:  Moss Mc, MD    Referring MD: Lenell Antu, DO   Problem list 1.  Essential hypertension 2.  CVA   Chief Complaint  Patient presents with  . Shortness of Breath    04/17/17  Kristen Barry is a 77 y.o. female with a hx of essential hypertension. She is the wife of Kelcey Korus ( a patient of mine)  Has had a CVA  Now has some right  leg pain , numbness  Is going to see ortho next week .   Is having more shortness of breath with exertion Used to walk a mile a day before her stroke.  Now she gets very short of breath even walking one quarter of a mile.  She does walk a quarter of a mile about 3 times every day.  Echocardiogram in October, 2017 shows normal left ventricular systolic function.  She has grade 1 diastolic dysfunction.  There is trivial aortic insufficiency  Feb. 25, 2019: Kristen Barry is seen back today for follow-up visit. Previous echocardiogram had revealed diastolic dysfunction.  A Lexiscan Myoview study on April 24, 2017 revealed normal left ventricular systolic function and no ischemia.  It was a low risk Myoview. Getting some regular exercise.  Breathing is better.   Wt is 206 lbs   Past Medical History:  Diagnosis Date  . Abnormality of gait 04/24/2016  . Carpal tunnel syndrome   . Cerebral artery occlusion with cerebral infarction (HCC)   . CVA (cerebral infarction) 02/25/2016  . Diverticulosis   . Environmental allergies   . GERD (gastroesophageal reflux disease)   . H/O Bell's palsy   . Hypertension   . Insomnia   . Laryngopharyngeal reflux   . Numbness in right leg   . OA (osteoarthritis)   . Obesity 02/25/2016  . Osteoarthritis   . Osteoarthrosis   . Osteopenia   . PAD (peripheral artery disease) (HCC)   . Paresthesia of skin   . Right sided weakness   . Seasonal allergies   . Slurred  speech 02/25/2016  . SOB (shortness of breath)   . Stroke [facility])     History reviewed. No pertinent surgical history.  Current Medications: Current Meds  Medication Sig  . amLODipine (NORVASC) 10 MG tablet Take 10 mg by mouth daily.  . Calcium Carbonate-Vit D-Min (CALCIUM 1200 PO) Take by mouth daily.  . celecoxib (CELEBREX) 200 MG capsule Take 200 mg by mouth daily.  . cholecalciferol (VITAMIN D) 1000 UNITS tablet Take 1,000 Units by mouth daily.  . clopidogrel (PLAVIX) 75 MG tablet Take 1 tablet (75 mg total) by mouth daily.  [name] docusate sodium (COLACE) 100 MG capsule Take 100 mg by mouth daily.  [name] gabapentin (NEURONTIN) 600 MG tablet Take 600 mg by mouth 3 (three) times daily.  [name] LOSARTAN POTASSIUM PO Take 25 mg by mouth at bedtime.  . meclizine (ANTIVERT) 25 MG tablet Take 25 mg by mouth as needed for dizziness.  . metoprolol succinate (TOPROL-XL) 100 MG 24 hr tablet Take 100 mg by mouth daily. Take with or immediately following a meal.  . Omega-3 Fatty Acids (FISH OIL) 1000 MG CAPS Take by mouth daily.  [name] omeprazole (PRILOSEC) 40 MG capsule Take 40 mg by mouth daily.  . rosuvastatin (CRESTOR) 10 MG tablet Take 10 mg  by mouth daily.  . vitamin C (ASCORBIC ACID) 500 MG tablet Take 500 mg by mouth daily.     Allergies:   Ace inhibitors and Codeine   Social History   Socioeconomic History  . Marital status: Married    Spouse name: None  . Number of children: 3  . Years of education: None  . Highest education level: None  Social Needs  . Financial resource strain: None  . Food insecurity - worry: None  . Food insecurity - inability: None  . Transportation needs - medical: None  . Transportation needs - non-medical: None  Occupational History  . None  Tobacco Use  . Smoking status: Former Games developermoker  . Smokeless tobacco: Never Used  . Tobacco comment: stop smoking when 77 years old   Substance and Sexual Activity  . Alcohol use: No  . Drug use: No  . Sexual activity: None    Other Topics Concern  . None  Social History Narrative   Lives with husband     Family History: The patient's family history includes CAD in her father; CVA in her mother; Diabetes in her mother; Heart Problems in her brother; Heart attack in her brother; Hypertension in her mother; Melanoma in her paternal aunt; Osteoarthritis in her mother. ROS:   Please see the history of present illness.     All other systems reviewed and are negative.  EKGs/Labs/Other Studies Reviewed:    The following studies were reviewed today:  EKG:     Recent Labs: No results found for requested labs within last 8760 hours.  Recent Lipid Panel    Component Value Date/Time   CHOL 213 (H) 02/25/2016 0951   TRIG 137 02/25/2016 0951   HDL 49 02/25/2016 0951   CHOLHDL 4.3 02/25/2016 0951   VLDL 27 02/25/2016 0951   LDLCALC 137 (H) 02/25/2016 0951    Physical Exam: Blood pressure (!) 128/58, pulse (!) 55, height 5\' 3"  (1.6 m), weight 206 lb 6.4 oz (93.6 kg), SpO2 95 %.  GEN:  W elderly female, moderately obese  HEENT: Normal NECK: No JVD; No carotid bruits LYMPHATICS: No lymphadenopathy CARDIAC: RRR , no murmurs, rubs, gallops RESPIRATORY:  Clear to auscultation without rales, wheezing or rhonchi  ABDOMEN: Soft, non-tender, non-distended MUSCULOSKELETAL:  No edema; No deformity  SKIN: Warm and dry NEUROLOGIC:  Alert and oriented x 3   ASSESSMENT:    No diagnosis found. PLAN:    In order of problems listed above:  :       1. Shortness of breath with exertion:  . Kristen Barry has some degree of diastolic dysfunction.  She has normal left ventricular systolic function.  Myoview study showed no evidence of ischemia.  She seems to be doing quite a bit better now that she is started a regular exercise program.  I have advised her to continue with her exercise.  She needs to work on some weight loss.  I will see her on an as-needed basis.  2.  Hypertension: Continue current medications.  3.   Hyperlipidemia: Followed by primary medical doctor.    Medication Adjustments/Labs and Tests Ordered: Current medicines are reviewed at length with the patient today.  Concerns regarding medicines are outlined above.  No orders of the defined types were placed in this encounter.  No orders of the defined types were placed in this encounter.   Signed, Kristeen MissPhilip Jassen Sarver, MD  07/23/2017 8:37 AM    [facility]

## 2017-07-23 NOTE — Patient Instructions (Addendum)
Medication Instructions:  Your physician recommends that you continue on your current medications as directed. Please refer to the Current Medication list given to you today.   Labwork: None ordered  Testing/Procedures: None ordered  Follow-Up: Your physician wants you to follow-up AS NEEDED with Dr. Nahser if your symptoms worsen or fail to improve  Any Other Special Instructions Will Be Listed Below (If Applicable).     If you need a refill on your cardiac medications before your next appointment, please call your pharmacy.   

## 2017-08-01 ENCOUNTER — Ambulatory Visit: Payer: Medicare Other | Admitting: Nurse Practitioner

## 2017-08-01 ENCOUNTER — Other Ambulatory Visit: Payer: Self-pay | Admitting: Obstetrics and Gynecology

## 2017-08-01 DIAGNOSIS — Z1231 Encounter for screening mammogram for malignant neoplasm of breast: Secondary | ICD-10-CM

## 2017-08-24 ENCOUNTER — Ambulatory Visit
Admission: RE | Admit: 2017-08-24 | Discharge: 2017-08-24 | Disposition: A | Discharge status: Home / Self Care | Payer: Medicare Other | Source: Ambulatory Visit | Attending: Obstetrics and Gynecology | Admitting: Obstetrics and Gynecology

## 2017-08-24 DIAGNOSIS — Z1231 Encounter for screening mammogram for malignant neoplasm of breast: Secondary | ICD-10-CM

## 2018-06-07 ENCOUNTER — Other Ambulatory Visit (HOSPITAL_BASED_OUTPATIENT_CLINIC_OR_DEPARTMENT_OTHER): Payer: Self-pay | Admitting: Physician Assistant

## 2018-06-07 DIAGNOSIS — Z1231 Encounter for screening mammogram for malignant neoplasm of breast: Secondary | ICD-10-CM

## 2018-06-07 DIAGNOSIS — R200 Anesthesia of skin: Secondary | ICD-10-CM

## 2018-06-08 ENCOUNTER — Ambulatory Visit (HOSPITAL_BASED_OUTPATIENT_CLINIC_OR_DEPARTMENT_OTHER)
Admission: RE | Admit: 2018-06-08 | Discharge: 2018-06-08 | Disposition: A | Discharge status: Home / Self Care | Payer: Medicare Other | Source: Ambulatory Visit | Attending: Physician Assistant | Admitting: Physician Assistant

## 2018-06-08 DIAGNOSIS — R200 Anesthesia of skin: Secondary | ICD-10-CM | POA: Insufficient documentation

## 2018-07-11 ENCOUNTER — Ambulatory Visit: Payer: Medicare Other | Admitting: Physician Assistant

## 2018-07-11 ENCOUNTER — Encounter: Payer: Self-pay | Admitting: Physician Assistant

## 2018-07-11 VITALS — BP 120/72 | HR 57 | Ht 63.0 in | Wt 210.8 lb

## 2018-07-11 DIAGNOSIS — I5032 Chronic diastolic (congestive) heart failure: Secondary | ICD-10-CM | POA: Diagnosis not present

## 2018-07-11 DIAGNOSIS — Z01810 Encounter for preprocedural cardiovascular examination: Secondary | ICD-10-CM | POA: Diagnosis not present

## 2018-07-11 DIAGNOSIS — I10 Essential (primary) hypertension: Secondary | ICD-10-CM

## 2018-07-11 NOTE — Patient Instructions (Signed)
Medication Instructions:  Your physician recommends that you continue on your current medications as directed. Please refer to the Current Medication list given to you today.  If you need a refill on your cardiac medications before your next appointment, please call your pharmacy.   Lab work: None ordered  If you have labs (blood work) drawn today and your tests are completely normal, you will receive your results only by: [name] MyChart Message (if you have MyChart) OR . A paper copy in the mail If you have any lab test that is abnormal or we need to change your treatment, we will call you to review the results.  Testing/Procedures: None ordered  Follow-Up: At [facility], you and your health needs are our priority.  As part of our continuing mission to provide you with exceptional heart care, we have created designated Provider Care Teams.  These Care Teams include your primary Cardiologist (physician) and Advanced Practice Providers (APPs -  Physician Assistants and Nurse Practitioners) who all work together to provide you with the care you need, when you need it. . Your physician recommends that you schedule a follow-up appointment in: AAS NEEDED   Any Other Special Instructions Will Be Listed Below (If Applicable).

## 2018-07-11 NOTE — Progress Notes (Signed)
Cardiology Office Note    Date:  07/11/2018   ID:  [name], DOB [birth year], MRN [id number]  PCP:  Heide ScalesNelson, Kristen M, PA-C  Cardiologist:  Dr. Elease HashimotoNahser  Chief Complaint: Surgical clearance   History of Present Illness:   Kristen Barry is a 78 y.o. female with hx of CVA, HTN and grade 1 DD diastolic dysfunction presents for surgical clearance.   Echo 02/2016: Mild basal septal LV hypertrophy withLVEF 60-65%, garde 1 DD, Mildly sclerotic aortic valve with trivial aortic regurgitation. Stress test 03/2017: Low risk  She had DOE which improved with exercise program. Last seen by Dr. Elease HashimotoNahser 06/2017 and advised to follow up PRN.   Here for surgical clearance for steroid shot for back pain. [facility]. However sent here by PCP. The patient denies nausea, vomiting, fever, chest pain, palpitations, shortness of breath, orthopnea, PND, dizziness, syncope, cough, congestion, abdominal pain, hematochezia, melena, lower extremity edema. Walking about 1/2 to 1 miles without any exertional symptoms.   Past Medical History:  Diagnosis Date  . Abnormality of gait 04/24/2016  . Carpal tunnel syndrome   . Cerebral artery occlusion with cerebral infarction (HCC)   . CVA (cerebral infarction) 02/25/2016  . Diverticulosis   . Environmental allergies   . GERD (gastroesophageal reflux disease)   . H/O Bell's palsy   . Hypertension   . Insomnia   . Laryngopharyngeal reflux   . Numbness in right leg   . OA (osteoarthritis)   . Obesity 02/25/2016  . Osteoarthritis   . Osteoarthrosis   . Osteopenia   . PAD (peripheral artery disease) (HCC)   . Paresthesia of skin   . Right sided weakness   . Seasonal allergies   . Slurred speech 02/25/2016  . SOB (shortness of breath)   . Stroke [facility](HCC)     No past surgical history on file.  Current Medications: Prior to Admission medications   Medication Sig Start Date End Date Taking? Authorizing Provider    amLODipine (NORVASC) 10 MG tablet Take 10 mg by mouth daily.    [provider]  Calcium Carbonate-Vit D-Min (CALCIUM 1200 PO) Take by mouth daily.    [provider]  celecoxib (CELEBREX) 200 MG capsule Take 200 mg by mouth daily.    [provider]  cholecalciferol (VITAMIN D) 1000 UNITS tablet Take 1,000 Units by mouth daily.    [provider]  clopidogrel (PLAVIX) 75 MG tablet Take 1 tablet (75 mg total) by mouth daily. 03/02/16   Hollice EspyKrishnan, Sendil K, MD  docusate sodium (COLACE) 100 MG capsule Take 100 mg by mouth daily.    [provider]  gabapentin (NEURONTIN) 600 MG tablet Take 600 mg by mouth 3 (three) times daily.    [provider]  LOSARTAN POTASSIUM PO Take 25 mg by mouth at bedtime.    [provider]  meclizine (ANTIVERT) 25 MG tablet Take 25 mg by mouth as needed for dizziness.    [provider]  metoprolol succinate (TOPROL-XL) 100 MG 24 hr tablet Take 100 mg by mouth daily. Take with or immediately following a meal.    [provider]  Omega-3 Fatty Acids (FISH OIL) 1000 MG CAPS Take by mouth daily.    [provider]  omeprazole (PRILOSEC) 40 MG capsule Take 40 mg by mouth daily.    [provider]  rosuvastatin (CRESTOR) 10 MG tablet Take 10 mg by mouth daily.  [provider]  vitamin C (ASCORBIC ACID) 500 MG tablet Take 500 mg by mouth daily.    [provider]    Allergies:   Ace inhibitors and Codeine   Social History   Socioeconomic History  . Marital status: Married    Spouse name: Not on file  . Number of children: 3  . Years of education: Not on file  . Highest education level: Not on file  Occupational History  . Not on file  Social Needs  . Financial resource strain: Not on file  . Food insecurity:    Worry: Not on file    Inability: Not on file  . Transportation needs:    Medical: Not on file    Non-medical: Not on file  Tobacco  Use  . Smoking status: Former Games developermoker  . Smokeless tobacco: Never Used  . Tobacco comment: stop smoking when 78 years old   Substance and Sexual Activity  . Alcohol use: No  . Drug use: No  . Sexual activity: Not on file  Lifestyle  . Physical activity:    Days per week: Not on file    Minutes per session: Not on file  . Stress: Not on file  Relationships  . Social connections:    Talks on phone: Not on file    Gets together: Not on file    Attends religious service: Not on file    Active member of club or organization: Not on file    Attends meetings of clubs or organizations: Not on file    Relationship status: Not on file  Other Topics Concern  . Not on file  Social History Narrative   Lives with husband     Family History:  The patient's family history includes CAD in her father; CVA in her mother; Diabetes in her mother; Heart Problems in her brother; Heart attack in her brother; Hypertension in her mother; Melanoma in her paternal aunt; Osteoarthritis in her mother.   ROS:   Please see the history of present illness.    ROS All other systems reviewed and are negative.   PHYSICAL EXAM:   VS:  BP 120/72   Pulse (!) 57   Ht 5\' 3"  (1.6 m)   Wt 210 lb 12.8 oz (95.6 kg)   BMI 37.34 kg/m    GEN: Well nourished, well developed, in no acute distress  HEENT: normal  Neck: no JVD, carotid bruits, or masses Cardiac: RRR; no murmurs, rubs, or gallops,no edema  Respiratory:  clear to auscultation bilaterally, normal work of breathing GI: soft, nontender, nondistended, + BS MS: no deformity or atrophy  Skin: warm and dry, no rash Neuro:  Alert and Oriented x 3, Strength and sensation are intact Psych: euthymic mood, full affect  Wt Readings from Last 3 Encounters:  07/11/18 210 lb 12.8 oz (95.6 kg)  07/23/17 206 lb 6.4 oz (93.6 kg)  04/24/17 197 lb (89.4 kg)      Studies/Labs Reviewed:   EKG:  EKG is ordered today.  The ekg ordered today demonstrates NSR at rate of  57 bpm  Recent Labs: No results found for requested labs within last 8760 hours.   Lipid Panel    Component Value Date/Time   CHOL 213 (H) 02/25/2016 0951   TRIG 137 02/25/2016 0951   HDL 49 02/25/2016 0951   CHOLHDL 4.3 02/25/2016 0951   VLDL 27 02/25/2016 0951   LDLCALC 137 (H) 02/25/2016 0951    Additional studies/ records that  were reviewed today include:   As above    ASSESSMENT & PLAN:    1. Chronic diastolic CHF - Euvolemic. Did not required any diuretics.   2. HTN - BP stable on current medications  3. Surgical clearance - She has not cardiac symptoms. Doing exercise. She will be cleared for any surgery in near future. Will defer decision to hold Plavix to PCP or neurology as she takes it for CVA.   Follow up with [place] PRN.     Medication Adjustments/Labs and Tests Ordered: Current medicines are reviewed at length with the patient today.  Concerns regarding medicines are outlined above.  Medication changes, Labs and Tests ordered today are listed in the Patient Instructions below. Patient Instructions  Medication Instructions:  Your physician recommends that you continue on your current medications as directed. Please refer to the Current Medication list given to you today.  If you need a refill on your cardiac medications before your next appointment, please call your pharmacy.   Lab work: None ordered  If you have labs (blood work) drawn today and your tests are completely normal, you will receive your results only by: [name] MyChart Message (if you have MyChart) OR . A paper copy in the mail If you have any lab test that is abnormal or we need to change your treatment, we will call you to review the results.  Testing/Procedures: None ordered  Follow-Up: At [facility], you and your health needs are our priority.  As part of our continuing mission to provide you with exceptional heart care, we have created designated Provider Care Teams.  These Care  Teams include your primary Cardiologist (physician) and Advanced Practice Providers (APPs -  Physician Assistants and Nurse Practitioners) who all work together to provide you with the care you need, when you need it. . Your physician recommends that you schedule a follow-up appointment in: AAS NEEDED   Any Other Special Instructions Will Be Listed Below (If Applicable).       Lorelei Pont, [place]  07/11/2018 2:30 PM    [facility] [address] [place], [place], [place]  [address] Phone: [phone]; Fax: [phone]

## 2018-07-23 NOTE — Addendum Note (Signed)
Addended by: Louanne Belton, Brittanni Cariker A on: 07/23/2018 08:15 AM   Modules accepted: Orders

## 2018-07-29 NOTE — Addendum Note (Signed)
Addended by: MENDOZA MENDEZ, Chinonso Linker A on: 07/29/2018 07:59 AM   Modules accepted: Orders  

## 2018-12-27 ENCOUNTER — Other Ambulatory Visit: Payer: Self-pay | Admitting: Physician Assistant

## 2018-12-27 DIAGNOSIS — M8580 Other specified disorders of bone density and structure, unspecified site: Secondary | ICD-10-CM

## 2019-07-04 ENCOUNTER — Other Ambulatory Visit: Payer: Self-pay | Admitting: Physician Assistant

## 2019-07-04 DIAGNOSIS — M79602 Pain in left arm: Secondary | ICD-10-CM

## 2019-07-09 ENCOUNTER — Ambulatory Visit
Admission: RE | Admit: 2019-07-09 | Discharge: 2019-07-09 | Disposition: A | Discharge status: Home / Self Care | Payer: Medicare Other | Source: Ambulatory Visit | Attending: Physician Assistant | Admitting: Physician Assistant

## 2019-07-09 DIAGNOSIS — M79602 Pain in left arm: Secondary | ICD-10-CM

## 2019-09-13 ENCOUNTER — Emergency Department (HOSPITAL_BASED_OUTPATIENT_CLINIC_OR_DEPARTMENT_OTHER): Payer: Medicare Other

## 2019-09-13 ENCOUNTER — Encounter (HOSPITAL_BASED_OUTPATIENT_CLINIC_OR_DEPARTMENT_OTHER): Payer: Self-pay | Admitting: Emergency Medicine

## 2019-09-13 ENCOUNTER — Emergency Department (HOSPITAL_BASED_OUTPATIENT_CLINIC_OR_DEPARTMENT_OTHER)
Admission: EM | Admit: 2019-09-13 | Discharge: 2019-09-13 | Disposition: A | Discharge status: Home / Self Care | Payer: Medicare Other | Attending: Emergency Medicine | Admitting: Emergency Medicine

## 2019-09-13 ENCOUNTER — Other Ambulatory Visit: Payer: Self-pay

## 2019-09-13 DIAGNOSIS — Z8673 Personal history of transient ischemic attack (TIA), and cerebral infarction without residual deficits: Secondary | ICD-10-CM | POA: Insufficient documentation

## 2019-09-13 DIAGNOSIS — I10 Essential (primary) hypertension: Secondary | ICD-10-CM | POA: Diagnosis not present

## 2019-09-13 DIAGNOSIS — Z79899 Other long term (current) drug therapy: Secondary | ICD-10-CM | POA: Insufficient documentation

## 2019-09-13 DIAGNOSIS — Z885 Allergy status to narcotic agent status: Secondary | ICD-10-CM | POA: Insufficient documentation

## 2019-09-13 DIAGNOSIS — M25462 Effusion, left knee: Secondary | ICD-10-CM | POA: Diagnosis not present

## 2019-09-13 DIAGNOSIS — Z87891 Personal history of nicotine dependence: Secondary | ICD-10-CM | POA: Diagnosis not present

## 2019-09-13 DIAGNOSIS — Z888 Allergy status to other drugs, medicaments and biological substances status: Secondary | ICD-10-CM | POA: Insufficient documentation

## 2019-09-13 DIAGNOSIS — M25562 Pain in left knee: Secondary | ICD-10-CM

## 2019-09-13 MED ORDER — TRAMADOL HCL 50 MG PO TABS: 50.0000 mg | ORAL_TABLET | Freq: Four times a day (QID) | ORAL | 0 refills | Status: AC | PRN

## 2019-09-13 NOTE — ED Provider Notes (Signed)
[place] EMERGENCY DEPARTMENT Provider Note   CSN: [id number] Arrival date & time: 09/13/19  1329     History Chief Complaint  Patient presents with  . Knee Pain    Kristen Barry is a 79 y.o. female.  Pt reports she has pain and swelling in her left knee.  Pt complains of feeling like knee is giving away.   The history is provided by the patient. No language interpreter was used.  Knee Pain Location:  Knee Time since incident:  1 week Injury: no   Knee location:  L knee Pain details:    Radiates to:  Does not radiate   Severity:  Moderate   Onset quality:  Gradual   Timing:  Constant   Progression:  Worsening Chronicity:  New Dislocation: no   Prior injury to area:  No Relieved by:  Nothing Worsened by:  Nothing Ineffective treatments:  None tried Associated symptoms: decreased ROM   Risk factors: no concern for non-accidental trauma        Past Medical History:  Diagnosis Date  . Abnormality of gait 04/24/2016  . Carpal tunnel syndrome   . Cerebral artery occlusion with cerebral infarction ([place])   . CVA (cerebral infarction) 02/25/2016  . Diverticulosis   . Environmental allergies   . GERD (gastroesophageal reflux disease)   . H/O Bell's palsy   . Hypertension   . Insomnia   . Laryngopharyngeal reflux   . Numbness in right leg   . OA (osteoarthritis)   . Obesity 02/25/2016  . Osteoarthritis   . Osteoarthrosis   . Osteopenia   . PAD (peripheral artery disease) ([place])   . Paresthesia of skin   . Right sided weakness   . Seasonal allergies   . Slurred speech 02/25/2016  . SOB (shortness of breath)   . Stroke [facility])     Patient Active Problem List   Diagnosis Date Noted  . DOE (dyspnea on exertion) 07/23/2017  . Hyperlipidemia 02/01/2017  . Abnormality of gait 04/24/2016  . Acute CVA (cerebrovascular accident) ([place])   . Right sided weakness 02/25/2016  . Slurred speech 02/25/2016  . CVA (cerebral infarction) 02/25/2016  . Obesity  02/25/2016  . Essential hypertension 07/10/2013    Past Surgical History:  Procedure Laterality Date  . ABDOMINAL HYSTERECTOMY    . EYE SURGERY       OB History   No obstetric history on file.     Family History  Problem Relation Age of Onset  . CVA Mother   . Diabetes Mother   . Osteoarthritis Mother   . Hypertension Mother   . CAD Father   . Heart Problems Brother        stent  . Heart attack Brother        2-3 caths  . Melanoma Paternal Aunt     Social History   Tobacco Use  . Smoking status: Former Research scientist (life sciences)  . Smokeless tobacco: Never Used  . Tobacco comment: stop smoking when 79 years old   Substance Use Topics  . Alcohol use: No  . Drug use: No    Home Medications Prior to Admission medications   Medication Sig Start Date End Date Taking? Authorizing Provider  amLODipine (NORVASC) 10 MG tablet Take 10 mg by mouth daily.    [provider]  Calcium Carbonate-Vit D-Min (CALCIUM 1200 PO) Take by mouth daily.    [provider]  cholecalciferol (VITAMIN D) 1000 UNITS tablet Take 1,000 Units by mouth  daily.    [provider]  clopidogrel (PLAVIX) 75 MG tablet Take 1 tablet (75 mg total) by mouth daily. 03/02/16   Hollice Espy, MD  gabapentin (NEURONTIN) 600 MG tablet Take 900 mg by mouth 3 (three) times daily.     [provider]  LOSARTAN POTASSIUM PO Take 25 mg by mouth at bedtime.    [provider]  meclizine (ANTIVERT) 25 MG tablet Take 25 mg by mouth as needed for dizziness.    [provider]  metoprolol succinate (TOPROL-XL) 100 MG 24 hr tablet Take 100 mg by mouth daily. Take with or immediately following a meal.    [provider]  Omega-3 Fatty Acids (FISH OIL) 1000 MG CAPS Take by mouth daily.    [provider]  omeprazole (PRILOSEC) 40 MG capsule Take 40 mg by mouth daily.    [provider]  rosuvastatin (CRESTOR) 10 MG tablet Take 10 mg by mouth daily.     [provider]  vitamin C (ASCORBIC ACID) 500 MG tablet Take 500 mg by mouth daily.    [provider]    Allergies    Ace inhibitors and Codeine  Review of Systems   Review of Systems  All other systems reviewed and are negative.   Physical Exam Updated Vital Signs BP (!) 133/102 (BP Location: Right Arm)   Pulse (!) 57   Temp 98 F (36.7 C) (Oral)   Resp 18   Ht 5\' 4"  (1.626 m)   Wt 96.2 kg   SpO2 96%   BMI 36.39 kg/m   Physical Exam Vitals and nursing note reviewed.  Constitutional:      Appearance: She is well-developed.  HENT:     Head: Normocephalic.  Cardiovascular:     Rate and Rhythm: Normal rate.  Pulmonary:     Effort: Pulmonary effort is normal.  Abdominal:     General: There is no distension.  Musculoskeletal:        General: Swelling and tenderness present.     Comments: Swollen left knee, moderate effusion, pain with movement   Skin:    General: Skin is warm.  Neurological:     Mental Status: She is alert and oriented to person, place, and time.     ED Results / Procedures / Treatments   Labs (all labs ordered are listed, but only abnormal results are displayed) Labs Reviewed - No data to display  EKG None  Radiology DG Knee Complete 4 Views Left  Result Date: 09/13/2019 CLINICAL DATA:  Left knee pain for 6 days. No known injury. EXAM: LEFT KNEE - COMPLETE 4+ VIEW COMPARISON:  None. FINDINGS: Small suprapatellar left knee joint effusion. No fracture or dislocation. No focal osseous lesions. Small superior left patellar enthesophyte. Mild tricompartmental left knee osteoarthritis. IMPRESSION: 1. Small suprapatellar left knee joint effusion. No fracture or malalignment. 2. Mild tricompartmental left knee osteoarthritis. Electronically Signed   By: 09/15/2019 M.D.   On: 09/13/2019 14:35    Procedures Procedures (including critical care time)  Medications Ordered in ED Medications - No data to display  ED Course  I  have reviewed the triage vital signs and the nursing notes.  Pertinent labs & imaging results that were available during my care of the patient were reviewed by me and considered in my medical decision making (see chart for details).    MDM Rules/Calculators/A&P  MDM: xray shows degenerative disease and effusion.  Pt couneled on results.  Pt placed in a knee imbolizer.  Final Clinical Impression(s) / ED Diagnoses Final diagnoses:  Acute pain of left knee  Effusion of left knee    Rx / DC Orders ED Discharge Orders         Ordered    traMADol (ULTRAM) 50 MG tablet  Every 6 hours PRN     09/13/19 1503        An After Visit Summary was printed and given to the patient.    Elson Areas, PA-C 09/13/19 1504    Virgina Norfolk, DO 09/14/19 [phone]

## 2019-09-13 NOTE — ED Triage Notes (Signed)
Pt c/o L knee pain x 1 week, with worsening pain yesterday. Pt denies injury

## 2019-09-13 NOTE — ED Notes (Signed)
Langston Masker ED Provider at bedside.

## 2019-09-13 NOTE — Discharge Instructions (Signed)
Return if any problems.

## 2019-10-21 ENCOUNTER — Other Ambulatory Visit: Payer: Self-pay | Admitting: Gastroenterology

## 2019-10-21 DIAGNOSIS — R1310 Dysphagia, unspecified: Secondary | ICD-10-CM

## 2019-10-29 ENCOUNTER — Ambulatory Visit
Admission: RE | Admit: 2019-10-29 | Discharge: 2019-10-29 | Disposition: A | Discharge status: Home / Self Care | Payer: Medicare Other | Source: Ambulatory Visit | Attending: Gastroenterology | Admitting: Gastroenterology

## 2019-10-29 DIAGNOSIS — R1310 Dysphagia, unspecified: Secondary | ICD-10-CM

## 2020-08-18 DIAGNOSIS — G8929 Other chronic pain: Secondary | ICD-10-CM | POA: Diagnosis not present

## 2020-08-18 DIAGNOSIS — I110 Hypertensive heart disease with heart failure: Secondary | ICD-10-CM | POA: Diagnosis not present

## 2020-08-18 DIAGNOSIS — I10 Essential (primary) hypertension: Secondary | ICD-10-CM | POA: Diagnosis not present

## 2020-08-18 DIAGNOSIS — E782 Mixed hyperlipidemia: Secondary | ICD-10-CM | POA: Diagnosis not present

## 2020-08-18 DIAGNOSIS — G4700 Insomnia, unspecified: Secondary | ICD-10-CM | POA: Diagnosis not present

## 2020-08-18 DIAGNOSIS — M159 Polyosteoarthritis, unspecified: Secondary | ICD-10-CM | POA: Diagnosis not present

## 2020-08-18 DIAGNOSIS — M189 Osteoarthritis of first carpometacarpal joint, unspecified: Secondary | ICD-10-CM | POA: Diagnosis not present

## 2020-08-18 DIAGNOSIS — I6350 Cerebral infarction due to unspecified occlusion or stenosis of unspecified cerebral artery: Secondary | ICD-10-CM | POA: Diagnosis not present

## 2020-08-18 DIAGNOSIS — I5032 Chronic diastolic (congestive) heart failure: Secondary | ICD-10-CM | POA: Diagnosis not present

## 2020-08-24 DIAGNOSIS — H26493 Other secondary cataract, bilateral: Secondary | ICD-10-CM | POA: Diagnosis not present

## 2020-09-23 DIAGNOSIS — M159 Polyosteoarthritis, unspecified: Secondary | ICD-10-CM | POA: Diagnosis not present

## 2020-09-23 DIAGNOSIS — E782 Mixed hyperlipidemia: Secondary | ICD-10-CM | POA: Diagnosis not present

## 2020-09-23 DIAGNOSIS — I110 Hypertensive heart disease with heart failure: Secondary | ICD-10-CM | POA: Diagnosis not present

## 2020-09-23 DIAGNOSIS — G8929 Other chronic pain: Secondary | ICD-10-CM | POA: Diagnosis not present

## 2020-09-23 DIAGNOSIS — I5032 Chronic diastolic (congestive) heart failure: Secondary | ICD-10-CM | POA: Diagnosis not present

## 2020-09-23 DIAGNOSIS — I10 Essential (primary) hypertension: Secondary | ICD-10-CM | POA: Diagnosis not present

## 2020-09-23 DIAGNOSIS — G4700 Insomnia, unspecified: Secondary | ICD-10-CM | POA: Diagnosis not present

## 2020-10-11 DIAGNOSIS — Z961 Presence of intraocular lens: Secondary | ICD-10-CM | POA: Diagnosis not present

## 2020-10-11 DIAGNOSIS — H04123 Dry eye syndrome of bilateral lacrimal glands: Secondary | ICD-10-CM | POA: Diagnosis not present

## 2020-10-11 DIAGNOSIS — H53023 Refractive amblyopia, bilateral: Secondary | ICD-10-CM | POA: Diagnosis not present

## 2020-10-11 DIAGNOSIS — Q120 Congenital cataract: Secondary | ICD-10-CM | POA: Diagnosis not present

## 2020-10-14 IMAGING — MR MR LUMBAR SPINE W/O CM
4 of 5 series · 25 of 48 positions shown · non-contrast
Comparison: 04/27/2017 lumbar spine MRI.

CLINICAL DATA: 77 y/o F; 3 years of right leg weakness. Increased
right leg weakness over the last 3-4 months. Right leg numbness.
Chronic lower back pain.

EXAM:
MRI LUMBAR SPINE WITHOUT CONTRAST
TECHNIQUE: Multiplanar, multisequence MR imaging of the lumbar spine was
performed. No intravenous contrast was administered.

[Series 2: T1 · sagittal · 4.0mm · 0.81mm/px · 6 of 14 slices shown (1 of 2)]
[im 1/14]
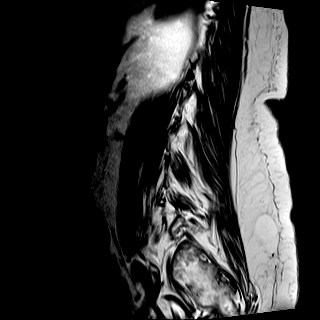
[im 3/14]
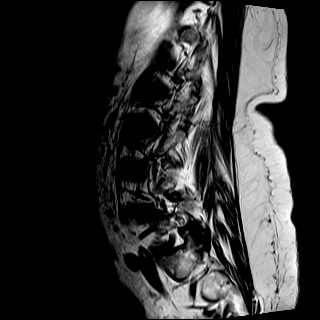
[im 6/14]
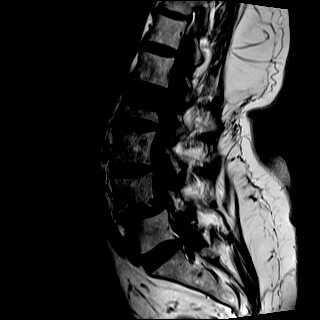
[im 8/14]
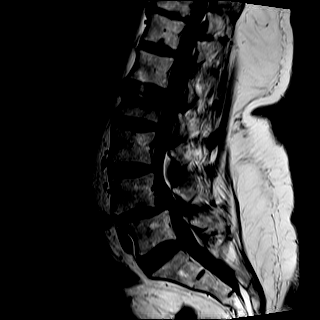
[im 11/14]
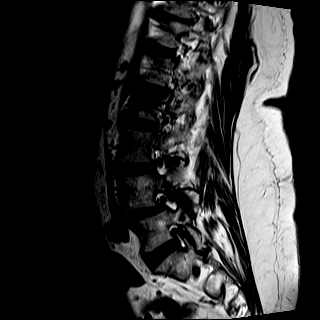
[im 14/14]
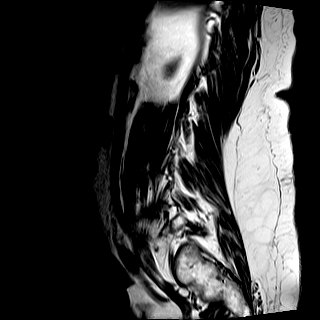

[Series 3: T2 · sagittal · 4.0mm · 0.81mm/px · 5 of 14 slices shown (1 of 2)]
[im 1/14]
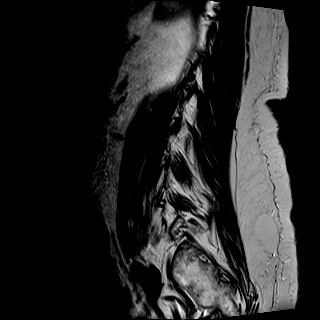
[im 4/14]
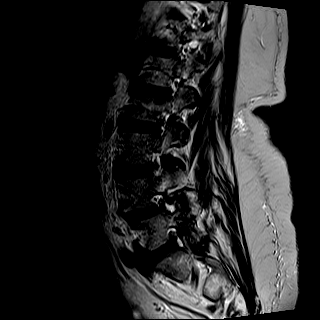
[im 7/14]
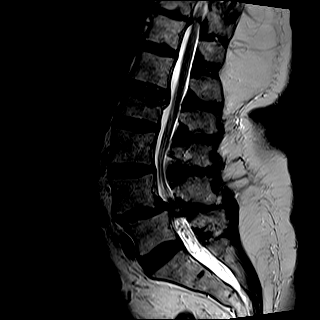
[im 10/14]
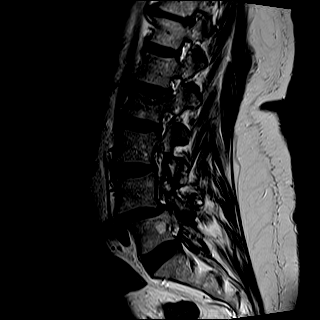
[im 14/14]
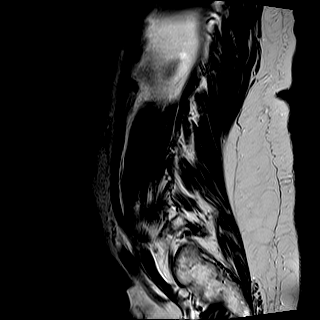

[Series 5: T2 · axial · 4.0mm · 0.39mm/px · z∈[-95,+100]mm · 10 of 42 slices shown (2 of 2)]
[im 3/42]
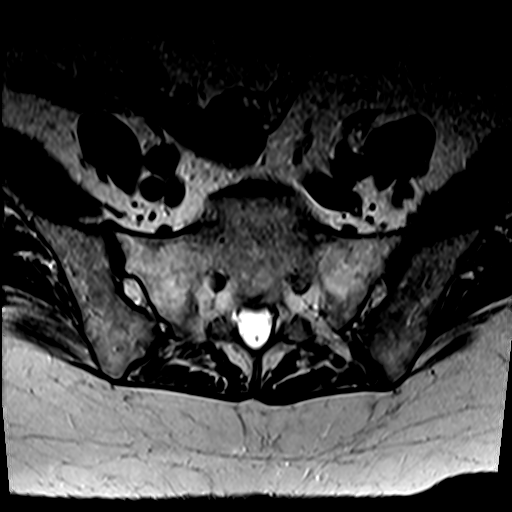
[im 6/42]
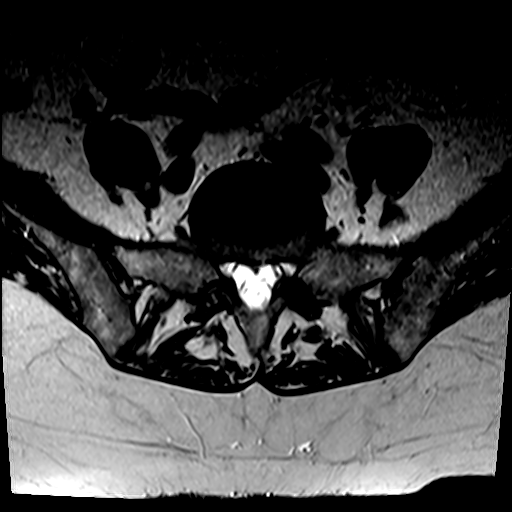
[im 9/42]
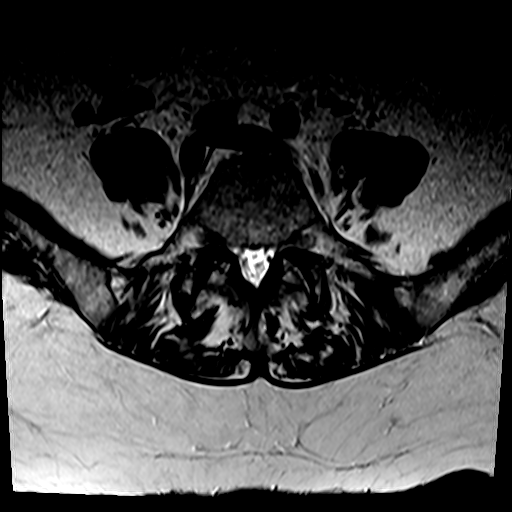
[im 14/42]
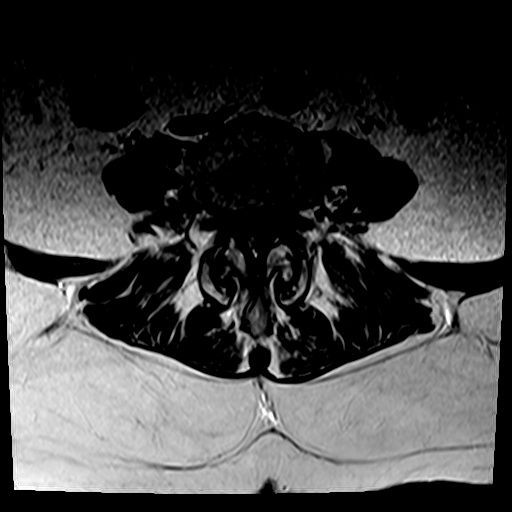
[im 20/42]
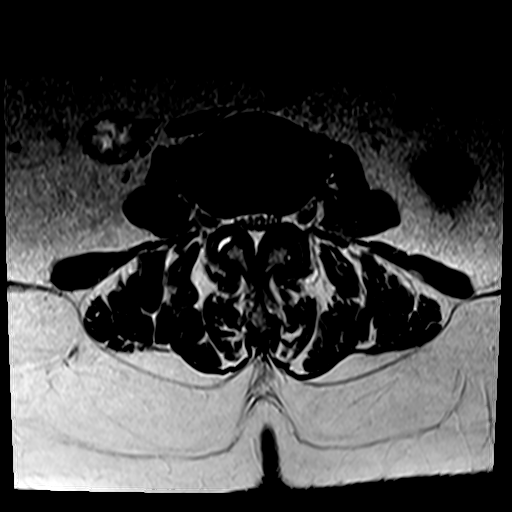
[im 22/42]
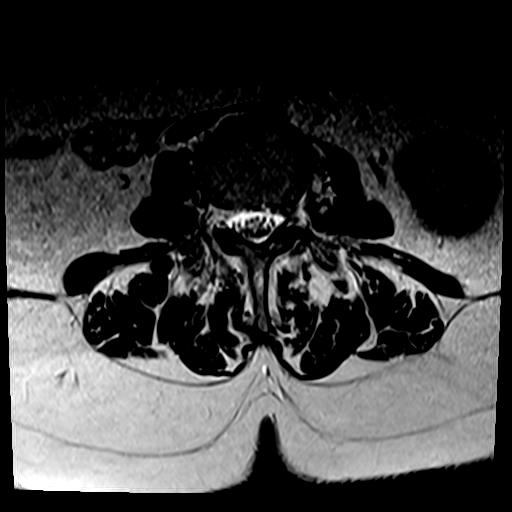
[im 25/42]
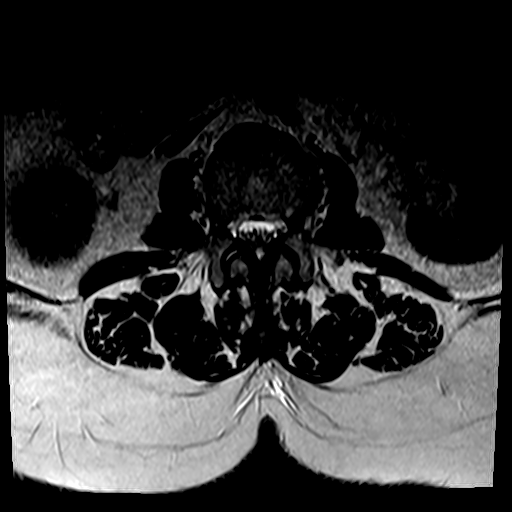
[im 31/42]
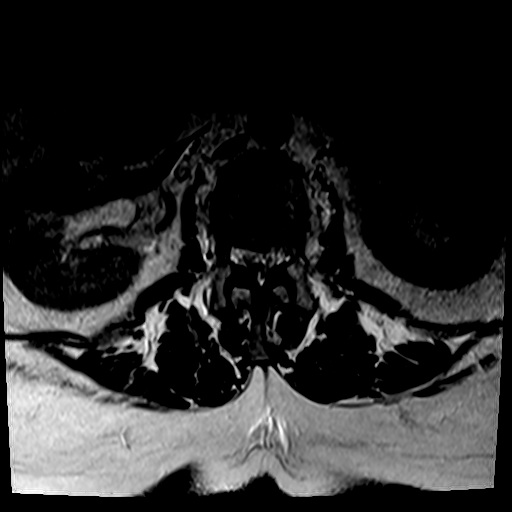
[im 36/42]
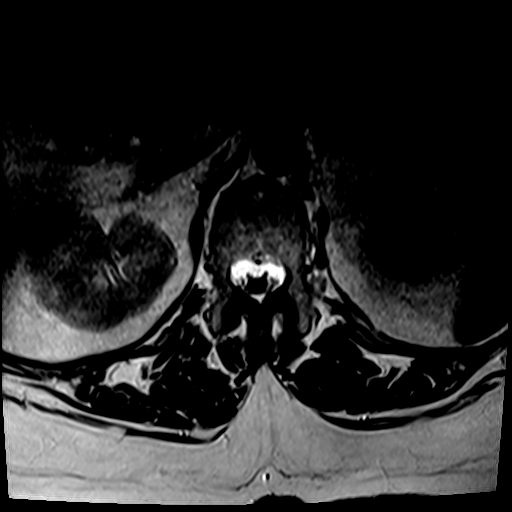
[im 42/42]
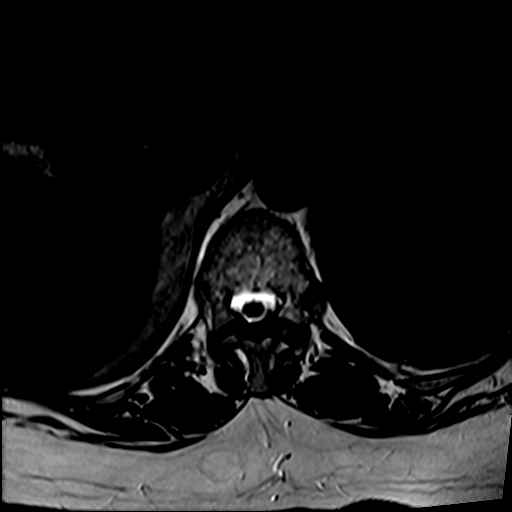

[Series 6: T1 · axial · 4.0mm · 0.39mm/px · z∈[-95,+70]mm · 4 of 42 slices shown (2 of 2)]
[im 3/42]
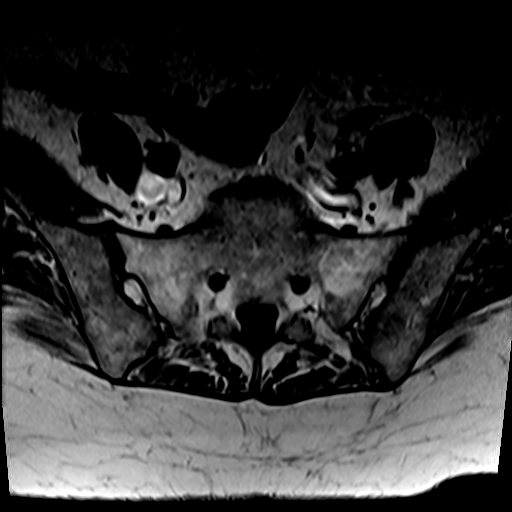
[im 6/42]
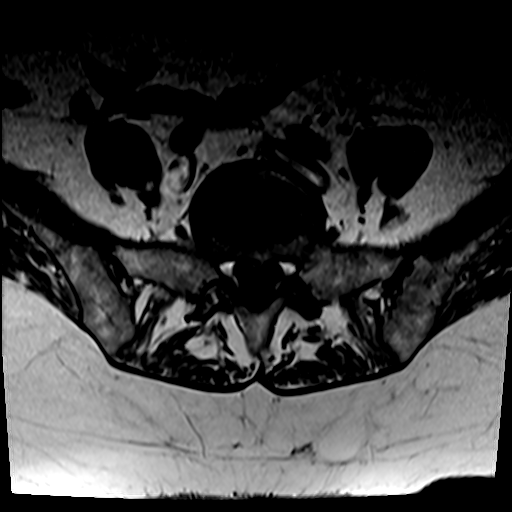
[im 22/42]
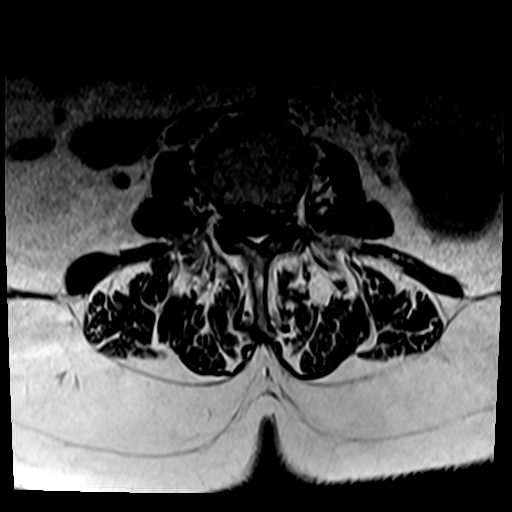
[im 36/42]
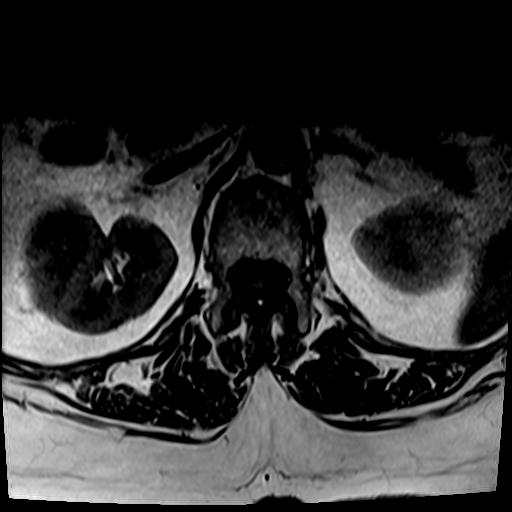

[25 of 48 positions shown; findings below may reference images not displayed]

FINDINGS: Segmentation:  Standard.

Alignment: Stable grade 1 L4-5 anterolisthesis with advanced facet
arthropathy. Trace L3-4 anterolisthesis.

Vertebrae: L2 superior endplate fracture with 30% central loss of
height, 2 mm retropulsion of the endplate, and edema indicating
recent injury. No additional fracture. No findings of discitis.

Conus medullaris and cauda equina: Conus extends to the L1-2 level.
Conus and cauda equina appear normal.

Paraspinal and other soft tissues: 9 mm cyst within the right
interpolar kidney.

Disc levels:

T12-L1: No significant disc displacement, foraminal stenosis, or
canal stenosis.

L1-2: Mild disc bulge and retropulsion of L2 endplate with mild
spinal canal stenosis. No significant foraminal stenosis.

L2-3: Stable mild disc bulge with facet and ligamentum flavum
hypertrophy. No significant foraminal or canal stenosis.

L3-4: Stable anterolisthesis with small uncovered disc bulge, facet
hypertrophy, and ligamentum flavum hypertrophy. No significant
foraminal stenosis. Mild spinal canal stenosis.

L4-5: Stable anterolisthesis with small uncovered disc bulge
combined with severe left-greater-than-right facet and ligamentum
flavum hypertrophy. Mild bilateral foraminal stenosis. Severe spinal
canal stenosis.

L5-S1: Stable disc bulge with right greater than left facet
hypertrophy. Mild bilateral foraminal stenosis. No significant
spinal canal stenosis.
IMPRESSION: 1. Interval L2 superior endplate fracture with 30% loss of height
and edema indicating recent injury. Minimal retropulsion of superior
endplate.
2. Stable lumbar spondylosis greatest at the L4-5 level where there
is severe spinal canal stenosis. Multilevel mild neural foraminal
stenosis.

These results will be called to the ordering clinician or
representative by the Radiologist Assistant, and communication
documented in the PACS or zVision Dashboard.

## 2020-11-08 DIAGNOSIS — I110 Hypertensive heart disease with heart failure: Secondary | ICD-10-CM | POA: Diagnosis not present

## 2020-11-08 DIAGNOSIS — E782 Mixed hyperlipidemia: Secondary | ICD-10-CM | POA: Diagnosis not present

## 2020-11-08 DIAGNOSIS — I6350 Cerebral infarction due to unspecified occlusion or stenosis of unspecified cerebral artery: Secondary | ICD-10-CM | POA: Diagnosis not present

## 2020-11-08 DIAGNOSIS — G8929 Other chronic pain: Secondary | ICD-10-CM | POA: Diagnosis not present

## 2020-11-08 DIAGNOSIS — G4700 Insomnia, unspecified: Secondary | ICD-10-CM | POA: Diagnosis not present

## 2020-11-08 DIAGNOSIS — M159 Polyosteoarthritis, unspecified: Secondary | ICD-10-CM | POA: Diagnosis not present

## 2020-11-08 DIAGNOSIS — I10 Essential (primary) hypertension: Secondary | ICD-10-CM | POA: Diagnosis not present

## 2020-11-08 DIAGNOSIS — I5032 Chronic diastolic (congestive) heart failure: Secondary | ICD-10-CM | POA: Diagnosis not present

## 2020-12-24 DIAGNOSIS — I10 Essential (primary) hypertension: Secondary | ICD-10-CM | POA: Diagnosis not present

## 2020-12-24 DIAGNOSIS — M159 Polyosteoarthritis, unspecified: Secondary | ICD-10-CM | POA: Diagnosis not present

## 2020-12-24 DIAGNOSIS — E782 Mixed hyperlipidemia: Secondary | ICD-10-CM | POA: Diagnosis not present

## 2020-12-24 DIAGNOSIS — I6350 Cerebral infarction due to unspecified occlusion or stenosis of unspecified cerebral artery: Secondary | ICD-10-CM | POA: Diagnosis not present

## 2020-12-24 DIAGNOSIS — G4700 Insomnia, unspecified: Secondary | ICD-10-CM | POA: Diagnosis not present

## 2020-12-24 DIAGNOSIS — G8929 Other chronic pain: Secondary | ICD-10-CM | POA: Diagnosis not present

## 2020-12-24 DIAGNOSIS — I110 Hypertensive heart disease with heart failure: Secondary | ICD-10-CM | POA: Diagnosis not present

## 2020-12-24 DIAGNOSIS — I5032 Chronic diastolic (congestive) heart failure: Secondary | ICD-10-CM | POA: Diagnosis not present

## 2021-01-04 DIAGNOSIS — I6350 Cerebral infarction due to unspecified occlusion or stenosis of unspecified cerebral artery: Secondary | ICD-10-CM | POA: Diagnosis not present

## 2021-01-04 DIAGNOSIS — R296 Repeated falls: Secondary | ICD-10-CM | POA: Diagnosis not present

## 2021-01-04 DIAGNOSIS — I739 Peripheral vascular disease, unspecified: Secondary | ICD-10-CM | POA: Diagnosis not present

## 2021-01-04 DIAGNOSIS — M159 Polyosteoarthritis, unspecified: Secondary | ICD-10-CM | POA: Diagnosis not present

## 2021-01-04 DIAGNOSIS — Z0000 Encounter for general adult medical examination without abnormal findings: Secondary | ICD-10-CM | POA: Diagnosis not present

## 2021-01-04 DIAGNOSIS — G8929 Other chronic pain: Secondary | ICD-10-CM | POA: Diagnosis not present

## 2021-01-04 DIAGNOSIS — I10 Essential (primary) hypertension: Secondary | ICD-10-CM | POA: Diagnosis not present

## 2021-01-04 DIAGNOSIS — G5791 Unspecified mononeuropathy of right lower limb: Secondary | ICD-10-CM | POA: Diagnosis not present

## 2021-01-04 DIAGNOSIS — I5032 Chronic diastolic (congestive) heart failure: Secondary | ICD-10-CM | POA: Diagnosis not present

## 2021-01-04 DIAGNOSIS — Z7901 Long term (current) use of anticoagulants: Secondary | ICD-10-CM | POA: Diagnosis not present

## 2021-01-04 DIAGNOSIS — R269 Unspecified abnormalities of gait and mobility: Secondary | ICD-10-CM | POA: Diagnosis not present

## 2021-01-04 DIAGNOSIS — E782 Mixed hyperlipidemia: Secondary | ICD-10-CM | POA: Diagnosis not present

## 2021-02-04 DIAGNOSIS — I10 Essential (primary) hypertension: Secondary | ICD-10-CM | POA: Diagnosis not present

## 2021-02-04 DIAGNOSIS — I872 Venous insufficiency (chronic) (peripheral): Secondary | ICD-10-CM | POA: Diagnosis not present

## 2021-03-16 DIAGNOSIS — G4700 Insomnia, unspecified: Secondary | ICD-10-CM | POA: Diagnosis not present

## 2021-03-16 DIAGNOSIS — I110 Hypertensive heart disease with heart failure: Secondary | ICD-10-CM | POA: Diagnosis not present

## 2021-03-16 DIAGNOSIS — K219 Gastro-esophageal reflux disease without esophagitis: Secondary | ICD-10-CM | POA: Diagnosis not present

## 2021-03-16 DIAGNOSIS — M159 Polyosteoarthritis, unspecified: Secondary | ICD-10-CM | POA: Diagnosis not present

## 2021-03-16 DIAGNOSIS — I10 Essential (primary) hypertension: Secondary | ICD-10-CM | POA: Diagnosis not present

## 2021-03-16 DIAGNOSIS — I5032 Chronic diastolic (congestive) heart failure: Secondary | ICD-10-CM | POA: Diagnosis not present

## 2021-03-16 DIAGNOSIS — E782 Mixed hyperlipidemia: Secondary | ICD-10-CM | POA: Diagnosis not present

## 2021-03-16 DIAGNOSIS — G8929 Other chronic pain: Secondary | ICD-10-CM | POA: Diagnosis not present

## 2021-04-07 DIAGNOSIS — Z23 Encounter for immunization: Secondary | ICD-10-CM | POA: Diagnosis not present

## 2021-04-07 DIAGNOSIS — I872 Venous insufficiency (chronic) (peripheral): Secondary | ICD-10-CM | POA: Diagnosis not present

## 2021-04-07 DIAGNOSIS — I10 Essential (primary) hypertension: Secondary | ICD-10-CM | POA: Diagnosis not present

## 2021-04-07 DIAGNOSIS — I5032 Chronic diastolic (congestive) heart failure: Secondary | ICD-10-CM | POA: Diagnosis not present

## 2021-04-28 DIAGNOSIS — I872 Venous insufficiency (chronic) (peripheral): Secondary | ICD-10-CM | POA: Diagnosis not present

## 2021-04-28 DIAGNOSIS — I10 Essential (primary) hypertension: Secondary | ICD-10-CM | POA: Diagnosis not present

## 2021-05-12 DIAGNOSIS — R944 Abnormal results of kidney function studies: Secondary | ICD-10-CM | POA: Diagnosis not present

## 2021-05-26 DIAGNOSIS — I10 Essential (primary) hypertension: Secondary | ICD-10-CM | POA: Diagnosis not present

## 2021-06-14 DIAGNOSIS — I10 Essential (primary) hypertension: Secondary | ICD-10-CM | POA: Diagnosis not present

## 2021-06-28 DIAGNOSIS — I10 Essential (primary) hypertension: Secondary | ICD-10-CM | POA: Diagnosis not present

## 2021-07-13 DIAGNOSIS — I10 Essential (primary) hypertension: Secondary | ICD-10-CM | POA: Diagnosis not present

## 2021-07-27 DIAGNOSIS — I10 Essential (primary) hypertension: Secondary | ICD-10-CM | POA: Diagnosis not present

## 2021-08-15 DIAGNOSIS — M159 Polyosteoarthritis, unspecified: Secondary | ICD-10-CM | POA: Diagnosis not present

## 2021-08-15 DIAGNOSIS — R1310 Dysphagia, unspecified: Secondary | ICD-10-CM | POA: Diagnosis not present

## 2021-08-15 DIAGNOSIS — E782 Mixed hyperlipidemia: Secondary | ICD-10-CM | POA: Diagnosis not present

## 2021-08-15 DIAGNOSIS — I10 Essential (primary) hypertension: Secondary | ICD-10-CM | POA: Diagnosis not present

## 2021-09-28 DIAGNOSIS — I10 Essential (primary) hypertension: Secondary | ICD-10-CM | POA: Diagnosis not present

## 2021-10-26 DIAGNOSIS — G8929 Other chronic pain: Secondary | ICD-10-CM | POA: Diagnosis not present

## 2021-10-26 DIAGNOSIS — I10 Essential (primary) hypertension: Secondary | ICD-10-CM | POA: Diagnosis not present

## 2021-10-26 DIAGNOSIS — K219 Gastro-esophageal reflux disease without esophagitis: Secondary | ICD-10-CM | POA: Diagnosis not present

## 2021-10-26 DIAGNOSIS — M159 Polyosteoarthritis, unspecified: Secondary | ICD-10-CM | POA: Diagnosis not present

## 2021-10-26 DIAGNOSIS — E782 Mixed hyperlipidemia: Secondary | ICD-10-CM | POA: Diagnosis not present

## 2022-01-18 DIAGNOSIS — L259 Unspecified contact dermatitis, unspecified cause: Secondary | ICD-10-CM | POA: Diagnosis not present

## 2022-01-18 DIAGNOSIS — K219 Gastro-esophageal reflux disease without esophagitis: Secondary | ICD-10-CM | POA: Diagnosis not present

## 2022-01-18 DIAGNOSIS — Z0000 Encounter for general adult medical examination without abnormal findings: Secondary | ICD-10-CM | POA: Diagnosis not present

## 2022-01-18 DIAGNOSIS — I10 Essential (primary) hypertension: Secondary | ICD-10-CM | POA: Diagnosis not present

## 2022-01-18 DIAGNOSIS — Z1231 Encounter for screening mammogram for malignant neoplasm of breast: Secondary | ICD-10-CM | POA: Diagnosis not present

## 2022-01-18 DIAGNOSIS — E782 Mixed hyperlipidemia: Secondary | ICD-10-CM | POA: Diagnosis not present

## 2022-01-18 DIAGNOSIS — Z23 Encounter for immunization: Secondary | ICD-10-CM | POA: Diagnosis not present

## 2022-01-18 DIAGNOSIS — Z13820 Encounter for screening for osteoporosis: Secondary | ICD-10-CM | POA: Diagnosis not present

## 2022-01-18 DIAGNOSIS — K224 Dyskinesia of esophagus: Secondary | ICD-10-CM | POA: Diagnosis not present

## 2022-01-18 DIAGNOSIS — M159 Polyosteoarthritis, unspecified: Secondary | ICD-10-CM | POA: Diagnosis not present

## 2022-01-30 DIAGNOSIS — B349 Viral infection, unspecified: Secondary | ICD-10-CM | POA: Diagnosis not present

## 2022-03-20 DIAGNOSIS — Z23 Encounter for immunization: Secondary | ICD-10-CM | POA: Diagnosis not present

## 2022-06-27 DIAGNOSIS — I6350 Cerebral infarction due to unspecified occlusion or stenosis of unspecified cerebral artery: Secondary | ICD-10-CM | POA: Diagnosis not present

## 2022-06-27 DIAGNOSIS — I10 Essential (primary) hypertension: Secondary | ICD-10-CM | POA: Diagnosis not present

## 2022-06-27 DIAGNOSIS — K219 Gastro-esophageal reflux disease without esophagitis: Secondary | ICD-10-CM | POA: Diagnosis not present

## 2022-06-27 DIAGNOSIS — M159 Polyosteoarthritis, unspecified: Secondary | ICD-10-CM | POA: Diagnosis not present

## 2022-06-27 DIAGNOSIS — I5032 Chronic diastolic (congestive) heart failure: Secondary | ICD-10-CM | POA: Diagnosis not present

## 2022-06-27 DIAGNOSIS — G8929 Other chronic pain: Secondary | ICD-10-CM | POA: Diagnosis not present

## 2022-06-27 DIAGNOSIS — E782 Mixed hyperlipidemia: Secondary | ICD-10-CM | POA: Diagnosis not present

## 2022-08-23 DIAGNOSIS — E782 Mixed hyperlipidemia: Secondary | ICD-10-CM | POA: Diagnosis not present

## 2022-08-23 DIAGNOSIS — N1831 Chronic kidney disease, stage 3a: Secondary | ICD-10-CM | POA: Diagnosis not present

## 2022-08-23 DIAGNOSIS — I739 Peripheral vascular disease, unspecified: Secondary | ICD-10-CM | POA: Diagnosis not present

## 2022-09-06 ENCOUNTER — Other Ambulatory Visit: Payer: Self-pay | Admitting: *Deleted

## 2022-09-06 DIAGNOSIS — I739 Peripheral vascular disease, unspecified: Secondary | ICD-10-CM

## 2022-09-13 ENCOUNTER — Encounter: Payer: Self-pay | Admitting: Vascular Surgery

## 2022-09-13 ENCOUNTER — Ambulatory Visit: Payer: Medicare Other | Admitting: Vascular Surgery

## 2022-09-13 ENCOUNTER — Ambulatory Visit (HOSPITAL_COMMUNITY)
Admission: RE | Admit: 2022-09-13 | Discharge: 2022-09-13 | Disposition: A | Discharge status: Home / Self Care | Payer: Medicare Other | Source: Ambulatory Visit | Attending: Vascular Surgery | Admitting: Vascular Surgery

## 2022-09-13 VITALS — BP 126/66 | HR 54 | Temp 98.1°F | Resp 20 | Ht 64.0 in | Wt 206.8 lb

## 2022-09-13 DIAGNOSIS — I739 Peripheral vascular disease, unspecified: Secondary | ICD-10-CM | POA: Diagnosis not present

## 2022-09-13 DIAGNOSIS — M25561 Pain in right knee: Secondary | ICD-10-CM | POA: Diagnosis not present

## 2022-09-13 DIAGNOSIS — M25562 Pain in left knee: Secondary | ICD-10-CM

## 2022-09-13 LAB — VAS US ABI WITH/WO TBI
Left ABI: 1.09
Right ABI: 1.1

## 2022-09-13 NOTE — Progress Notes (Signed)
Patient ID: Kristen Barry, female   DOB: 1941-04-14, 82 y.o.   MRN: [id number]  Reason for Consult: No chief complaint on file.   Referred by Koren Shiver, DO  Subjective:     HPI:  Kristen Barry is a 82 y.o. female previously evaluated in this office for leg pain in 2018 which was thought to be secondary to underlying neurologic issues given her normal triphasic waveforms with palpable dorsalis pedis and posterior tibial arteries at that time.  She has more recently undergone home studies which stated that she had vascular disease.  Keep appointment really numbness in her legs she has been evaluated by neuropathy physician in [facility] but they have been unsuccessful in treating her.  She denies tissue loss or claudication.  She does have a history of stroke in 2017 but recovered without any ongoing deficits and has no personal or family history of aneurysm disease.  Past Medical History:  Diagnosis Date   Abnormality of gait 04/24/2016   Carpal tunnel syndrome    Cerebral artery occlusion with cerebral infarction [facility])    CVA (cerebral infarction) 02/25/2016   Diverticulosis    Environmental allergies    GERD (gastroesophageal reflux disease)    H/O Bell's palsy    Hypertension    Insomnia    Laryngopharyngeal reflux    Numbness in right leg    OA (osteoarthritis)    Obesity 02/25/2016   Osteoarthritis    Osteoarthrosis    Osteopenia    PAD (peripheral artery disease) (HCC)    Paresthesia of skin    Right sided weakness    Seasonal allergies    Slurred speech 02/25/2016   SOB (shortness of breath)    Stroke (HCC)    Family History  Problem Relation Age of Onset   CVA Mother    Diabetes Mother    Osteoarthritis Mother    Hypertension Mother    CAD Father    Heart Problems Brother        stent   Heart attack Brother        2-3 caths   Melanoma Paternal Aunt    Past Surgical History:  Procedure Laterality Date   ABDOMINAL HYSTERECTOMY      EYE SURGERY      Short Social History:  Social History   Tobacco Use   Smoking status: Former   Smokeless tobacco: Never   Tobacco comments:    stop smoking when 82 years old   Substance Use Topics   Alcohol use: No    Allergies  Allergen Reactions   Ace Inhibitors     Cough    Codeine     Stomach upset     Current Outpatient Medications  Medication Sig Dispense Refill   amLODipine (NORVASC) 10 MG tablet Take 10 mg by mouth daily.     Calcium Carbonate-Vit D-Min (CALCIUM 1200 PO) Take by mouth daily.     cholecalciferol (VITAMIN D) 1000 UNITS tablet Take 1,000 Units by mouth daily.     clopidogrel (PLAVIX) 75 MG tablet Take 1 tablet (75 mg total) by mouth daily. 30 tablet 2   gabapentin (NEURONTIN) 600 MG tablet Take 900 mg by mouth 3 (three) times daily.      LOSARTAN POTASSIUM PO Take 25 mg by mouth at bedtime.     meclizine (ANTIVERT) 25 MG tablet Take 25 mg by mouth as needed for dizziness.     metoprolol succinate (TOPROL-XL) 100 MG 24 hr tablet  Take 100 mg by mouth daily. Take with or immediately following a meal.     Omega-3 Fatty Acids (FISH OIL) 1000 MG CAPS Take by mouth daily.     omeprazole (PRILOSEC) 40 MG capsule Take 40 mg by mouth daily.     rosuvastatin (CRESTOR) 10 MG tablet Take 10 mg by mouth daily.     vitamin C (ASCORBIC ACID) 500 MG tablet Take 500 mg by mouth daily.     No current facility-administered medications for this visit.    Review of Systems  Constitutional:  Constitutional negative. HENT: HENT negative.  Eyes: Eyes negative.  Respiratory: Respiratory negative.  Cardiovascular: Cardiovascular negative.  GI: Gastrointestinal negative.  GU: Genitourinary negative. Musculoskeletal: Positive for leg pain.  Skin: Skin negative.  Neurological: Positive for numbness.  Hematologic: Hematologic/lymphatic negative.  Psychiatric: Psychiatric negative.        Objective:  Objective   Vitals:   09/13/22 1029  BP: 126/66  Pulse:  (!) 54  Resp: 20  Temp: 98.1 F (36.7 C)  SpO2: 94%     Physical Exam HENT:     Head: Normocephalic.     Nose: Nose normal.  Eyes:     Pupils: Pupils are equal, round, and reactive to light.  Neck:     Vascular: No carotid bruit.  Cardiovascular:     Rate and Rhythm: Normal rate.     Pulses: Normal pulses.     Heart sounds: Murmur heard.  Pulmonary:     Effort: Pulmonary effort is normal.  Abdominal:     General: Abdomen is flat.     Palpations: Abdomen is soft. There is no mass.  Skin:    General: Skin is warm.     Capillary Refill: Capillary refill takes less than 2 seconds.  Neurological:     General: No focal deficit present.     Mental Status: She is alert.  Psychiatric:        Mood and Affect: Mood normal.        Behavior: Behavior normal.        Thought Content: Thought content normal.        Judgment: Judgment normal.     Data: ABI Findings:  +---------+------------------+-----+--------+--------+  Right   Rt Pressure (mmHg)IndexWaveformComment   +---------+------------------+-----+--------+--------+  Brachial 152                                      +---------+------------------+-----+--------+--------+  PTA     167               1.10 biphasic          +---------+------------------+-----+--------+--------+  DP      135               0.89 biphasic          +---------+------------------+-----+--------+--------+  Great Toe118               0.78                   +---------+------------------+-----+--------+--------+   +---------+------------------+-----+---------+-------+  Left    Lt Pressure (mmHg)IndexWaveform Comment  +---------+------------------+-----+---------+-------+  Brachial 151                                      +---------+------------------+-----+---------+-------+  PTA     165  1.09 triphasic         +---------+------------------+-----+---------+-------+  DP      147                0.97 biphasic          +---------+------------------+-----+---------+-------+  Great Toe123               0.81                   +---------+------------------+-----+---------+-------+   +-------+-----------+-----------+------------+------------+  ABI/TBIToday's ABIToday's TBIPrevious ABIPrevious TBI  +-------+-----------+-----------+------------+------------+  Right 1.10       0.78       1.28        0.77          +-------+-----------+-----------+------------+------------+  Left  1.09       0.81       1.16        0.83          +-------+-----------+-----------+------------+------------+       Assessment/Plan:    82 year old female with bilateral lower extremity pain and numbness with normal ABIs with triphasic waveforms on the left and biphasic on the right with preserved great toe pressures and palpable pulses.  As such I recommended no further vascular imaging and she can see me on an as-needed basis.    Maeola Harman MD [facility]

## 2023-01-22 DIAGNOSIS — K224 Dyskinesia of esophagus: Secondary | ICD-10-CM | POA: Diagnosis not present

## 2023-01-22 DIAGNOSIS — E782 Mixed hyperlipidemia: Secondary | ICD-10-CM | POA: Diagnosis not present

## 2023-01-22 DIAGNOSIS — I5032 Chronic diastolic (congestive) heart failure: Secondary | ICD-10-CM | POA: Diagnosis not present

## 2023-01-22 DIAGNOSIS — Z8673 Personal history of transient ischemic attack (TIA), and cerebral infarction without residual deficits: Secondary | ICD-10-CM | POA: Diagnosis not present

## 2023-01-22 DIAGNOSIS — I10 Essential (primary) hypertension: Secondary | ICD-10-CM | POA: Diagnosis not present

## 2023-01-22 DIAGNOSIS — G4762 Sleep related leg cramps: Secondary | ICD-10-CM | POA: Diagnosis not present

## 2023-01-22 DIAGNOSIS — Z0000 Encounter for general adult medical examination without abnormal findings: Secondary | ICD-10-CM | POA: Diagnosis not present

## 2023-01-22 DIAGNOSIS — I130 Hypertensive heart and chronic kidney disease with heart failure and stage 1 through stage 4 chronic kidney disease, or unspecified chronic kidney disease: Secondary | ICD-10-CM | POA: Diagnosis not present

## 2023-01-22 DIAGNOSIS — Z23 Encounter for immunization: Secondary | ICD-10-CM | POA: Diagnosis not present

## 2023-01-22 DIAGNOSIS — N1831 Chronic kidney disease, stage 3a: Secondary | ICD-10-CM | POA: Diagnosis not present

## 2023-02-16 DIAGNOSIS — Z23 Encounter for immunization: Secondary | ICD-10-CM | POA: Diagnosis not present

## 2023-03-21 DIAGNOSIS — I10 Essential (primary) hypertension: Secondary | ICD-10-CM | POA: Diagnosis not present

## 2023-03-21 DIAGNOSIS — N1831 Chronic kidney disease, stage 3a: Secondary | ICD-10-CM | POA: Diagnosis not present

## 2023-03-21 DIAGNOSIS — G4762 Sleep related leg cramps: Secondary | ICD-10-CM | POA: Diagnosis not present

## 2023-09-12 DIAGNOSIS — I10 Essential (primary) hypertension: Secondary | ICD-10-CM | POA: Diagnosis not present

## 2023-10-12 ENCOUNTER — Other Ambulatory Visit: Payer: Self-pay

## 2023-10-12 ENCOUNTER — Emergency Department (HOSPITAL_BASED_OUTPATIENT_CLINIC_OR_DEPARTMENT_OTHER): Admission: EM | Admit: 2023-10-12 | Discharge: 2023-10-12 | Disposition: A | Discharge status: Home / Self Care

## 2023-10-12 ENCOUNTER — Emergency Department (HOSPITAL_BASED_OUTPATIENT_CLINIC_OR_DEPARTMENT_OTHER)

## 2023-10-12 ENCOUNTER — Encounter (HOSPITAL_BASED_OUTPATIENT_CLINIC_OR_DEPARTMENT_OTHER): Payer: Self-pay | Admitting: Emergency Medicine

## 2023-10-12 DIAGNOSIS — Z79899 Other long term (current) drug therapy: Secondary | ICD-10-CM | POA: Diagnosis not present

## 2023-10-12 DIAGNOSIS — M7989 Other specified soft tissue disorders: Secondary | ICD-10-CM | POA: Diagnosis not present

## 2023-10-12 DIAGNOSIS — I10 Essential (primary) hypertension: Secondary | ICD-10-CM | POA: Diagnosis not present

## 2023-10-12 DIAGNOSIS — R600 Localized edema: Secondary | ICD-10-CM | POA: Diagnosis not present

## 2023-10-12 DIAGNOSIS — Z7901 Long term (current) use of anticoagulants: Secondary | ICD-10-CM | POA: Insufficient documentation

## 2023-10-12 DIAGNOSIS — I2510 Atherosclerotic heart disease of native coronary artery without angina pectoris: Secondary | ICD-10-CM | POA: Diagnosis not present

## 2023-10-12 DIAGNOSIS — I517 Cardiomegaly: Secondary | ICD-10-CM | POA: Diagnosis not present

## 2023-10-12 DIAGNOSIS — I700 Atherosclerosis of aorta: Secondary | ICD-10-CM | POA: Diagnosis not present

## 2023-10-12 LAB — CBC
HCT: 40.4 % (ref 36.0–46.0)
Hemoglobin: 13.4 g/dL (ref 12.0–15.0)
MCH: 30.5 pg (ref 26.0–34.0)
MCHC: 33.2 g/dL (ref 30.0–36.0)
MCV: 92 fL (ref 80.0–100.0)
Platelets: 178 10*3/uL (ref 150–400)
RBC: 4.39 MIL/uL (ref 3.87–5.11)
RDW: 12.3 % (ref 11.5–15.5)
WBC: 5.3 10*3/uL (ref 4.0–10.5)
nRBC: 0 % (ref 0.0–0.2)

## 2023-10-12 LAB — BASIC METABOLIC PANEL WITH GFR
Anion gap: 16 — ABNORMAL HIGH (ref 5–15)
BUN: 15 mg/dL (ref 8–23)
CO2: 20 mmol/L — ABNORMAL LOW (ref 22–32)
Calcium: 9.8 mg/dL (ref 8.9–10.3)
Chloride: 102 mmol/L (ref 98–111)
Creatinine, Ser: 1.1 mg/dL — ABNORMAL HIGH (ref 0.44–1.00)
GFR, Estimated: 50 mL/min — ABNORMAL LOW (ref >60)
Glucose, Bld: 89 mg/dL (ref 70–99)
Potassium: 3.9 mmol/L (ref 3.5–5.1)
Sodium: 138 mmol/L (ref 135–145)

## 2023-10-12 LAB — PRO BRAIN NATRIURETIC PEPTIDE: Pro Brain Natriuretic Peptide: 1163 pg/mL — ABNORMAL HIGH (ref <300.0)

## 2023-10-12 LAB — TROPONIN T, HIGH SENSITIVITY
Troponin T High Sensitivity: 16 ng/L (ref <19)
Troponin T High Sensitivity: 16 ng/L (ref <19)

## 2023-10-12 MED ORDER — FUROSEMIDE 40 MG PO TABS: 40.0000 mg | ORAL_TABLET | Freq: Every day | ORAL | 0 refills | Status: DC

## 2023-10-12 MED ORDER — FUROSEMIDE 10 MG/ML IJ SOLN
40.0000 mg | Freq: Once | INTRAMUSCULAR | Status: AC
  Administered 2023-10-12: 40 mg via INTRAVENOUS
  Filled 2023-10-12: qty 4

## 2023-10-12 NOTE — Discharge Instructions (Addendum)
 I did speak with our cardiologist on-call.  They did recommend that you follow-up as an outpatient.  Please take the Lasix as prescribed.  If you do not hear anything from their office by late Monday please call the number provided to inquire about an appointment.  Their plan is to try to get you an appointment next week.  Return to the ER for worsening symptoms.

## 2023-10-12 NOTE — ED Notes (Signed)
 Pt alert and oriented X 4 at the time of discharge. RR even and unlabored. No acute distress noted. Pt verbalized understanding of discharge instructions as discussed. Pt ambulatory to lobby at time of discharge.

## 2023-10-12 NOTE — ED Triage Notes (Signed)
 Bilateral leg swelling since Sunday.  Pt admits to sob.  Pt has gained 4 pounds.  No history of this.  Denies chest pain.

## 2023-10-12 NOTE — ED Provider Notes (Signed)
 [place] [facility] AT [facility] Provider Note   CSN: [id number] Arrival date & time: 10/12/23  1423     History  Chief Complaint  Patient presents with   Leg Swelling    Kristen Barry is a 83 y.o. female.  83 year old female with past medical history of hypertension and coronary artery disease presenting to the emergency department today with lower extremity swelling and dyspnea.  This been going on now for the past few days.  The patient does report a 4 pound weight gain.  She has been having some dyspnea with minimal exertion which is new for her as well.  Denies any associated chest pain.  She denies a history of DVT or pulmonary embolism, recent surgeries, recent travel.  Denies any hemoptysis.  She denies any associated chest pain.  She came to the ER today for further evaluation regarding this.  Looking back through her records it does appear that she had some diastolic dysfunction back on an echocardiogram from 2017 but is not had any issues and is not on any diuretics.        Home Medications Prior to Admission medications   Medication Sig Start Date End Date Taking? Authorizing Provider  furosemide (LASIX) 40 MG tablet Take 1 tablet (40 mg total) by mouth daily. 10/12/23  Yes Carin Charleston, MD  amLODipine (NORVASC) 10 MG tablet Take 10 mg by mouth daily.    [provider]  Calcium  Carbonate-Vit D-Min (CALCIUM  1200 PO) Take by mouth daily.    [provider]  cholecalciferol (VITAMIN D) 1000 UNITS tablet Take 1,000 Units by mouth daily.    [provider]  clopidogrel  (PLAVIX ) 75 MG tablet Take 1 tablet (75 mg total) by mouth daily. 03/02/16   Krishnan, Sendil K, MD  ezetimibe (ZETIA) 10 MG tablet Take 10 mg by mouth daily. 07/20/22   [provider]  gabapentin (NEURONTIN) 600 MG tablet Take 900 mg by mouth 3 (three) times daily.     [provider]  LOSARTAN POTASSIUM PO Take 25 mg by mouth at bedtime.     [provider]  meclizine (ANTIVERT) 25 MG tablet Take 25 mg by mouth as needed for dizziness.    [provider]  metoprolol succinate (TOPROL-XL) 100 MG 24 hr tablet Take 100 mg by mouth daily. Take with or immediately following a meal.    [provider]  Omega-3 Fatty Acids (FISH OIL) 1000 MG CAPS Take by mouth daily.    [provider]  omeprazole (PRILOSEC) 40 MG capsule Take 40 mg by mouth daily.    [provider]  vitamin C (ASCORBIC ACID) 500 MG tablet Take 500 mg by mouth daily.    [provider]      Allergies    Ace inhibitors and Codeine    Review of Systems   Review of Systems  Respiratory:  Positive for shortness of breath.   Cardiovascular:  Positive for leg swelling.  All other systems reviewed and are negative.   Physical Exam Updated Vital Signs BP (!) 178/68   Pulse (!) 54   Temp 98 F (36.7 C)   Resp 18   Ht 5\' 2"  (1.575 m)   Wt 97.1 kg   SpO2 97%   BMI 39.14 kg/m  Physical Exam Vitals and nursing note reviewed.   Gen: NAD Eyes: PERRL, EOMI HEENT: no oropharyngeal swelling Neck: trachea midline Resp: Diminished with faint wheezes at bilateral lung bases Card:  RRR, no murmurs, rubs, or gallops Abd: nontender, nondistended Extremities: 2+ pitting edema bilaterally, no calf tenderness Vascular: 2+ radial pulses bilaterally, 2+ DP pulses bilaterally Skin: no rashes Psyc: acting appropriately   ED Results / Procedures / Treatments   Labs (all labs ordered are listed, but only abnormal results are displayed) Labs Reviewed  BASIC METABOLIC PANEL WITH GFR - Abnormal; Notable for the following components:      Result Value   CO2 20 (*)    Creatinine, Ser 1.10 (*)    GFR, Estimated 50 (*)    Anion gap 16 (*)    All other components within normal limits  PRO BRAIN NATRIURETIC PEPTIDE - Abnormal; Notable for the following components:   Pro Brain Natriuretic Peptide 1,163.0 (*)    All other  components within normal limits  CBC  URINALYSIS, ROUTINE W REFLEX MICROSCOPIC  TROPONIN T, HIGH SENSITIVITY  TROPONIN T, HIGH SENSITIVITY    EKG None  Radiology DG Chest 2 View Result Date: 10/12/2023 CLINICAL DATA:  Leg swelling short of breath EXAM: CHEST - 2 VIEW COMPARISON:  02/25/2016 FINDINGS: No acute airspace disease or effusion. Stable enlarged cardiomediastinal silhouette with aortic atherosclerosis. No pneumothorax IMPRESSION: No active cardiopulmonary disease. Stable cardiomegaly. Electronically Signed   By: Esmeralda Hedge M.D.   On: 10/12/2023 15:25    Procedures Procedures    Medications Ordered in ED Medications  furosemide (LASIX) injection 40 mg (has no administration in time range)    ED Course/ Medical Decision Making/ A&P                                 Medical Decision Making 83 year old female with past medical history of hypertension and coronary artery disease presenting to the emergency department today with symptoms distant with likely CHF.  I will further evaluate patient here with basic labs as well as a BNP.  Also obtain a chest x-ray to evaluate for pulmonary edema.  I will obtain an ambulatory pulse ox on the patient.  She is not requiring oxygen now but does report being very dyspneic with minimal exertion.  I will reevaluate for ultimate disposition.  She does have a remote history of diastolic dysfunction but has had significant weight gain within the past 3 to 4 days and is symptomatic acutely.  She may require admission.  The patient's chest x-ray is clear.  Labs are reassuring.  I did call and discussed her case with Dr. Alvis Ba from cardiology.  She was not hypoxic with ambulation.  He recommended starting her on Lasix and he will arrange for quick follow-up next week for reevaluation.  Risk Prescription drug management.           Final Clinical Impression(s) / ED Diagnoses Final diagnoses:  Peripheral edema    Rx / DC  Orders ED Discharge Orders          Ordered    furosemide (LASIX) 40 MG tablet  Daily        10/12/23 1935              Carin Charleston, MD 10/12/23 [phone]

## 2023-10-22 NOTE — Progress Notes (Unsigned)
 Cardiology Office Note:  .   Date:  10/24/2023  ID:  Kristen Barry, DOB 08-29-1940, MRN [id number] PCP: Ayesha Lente Lowell Rude, FNP   HeartCare Providers Cardiologist:  Oneil Bigness, MD { History of Present Illness: .    Chief Complaint  Patient presents with   Leg Swelling    Kristen Barry is a 83 y.o. female with history of HFpEF who presents for the evaluation of HFpEF at the request of Stamey, Lowell Rude, FNP. Seen in ER 10/12/2023 with indeterminate pro-NT BNP for age. CXR negative.    History of Present Illness   Kristen Barry is an 83 year old female with heart failure with preserved ejection fraction who presents for follow-up after an ER visit for lower extremity edema.  She was seen in the ER on Oct 12, 2023, for swelling of the feet and was prescribed Lasix , which helped with the edema. She monitors her weight daily, noting a fluctuation from 211 lbs in the morning to 214 lbs at night, and consumes about 64 ounces of water daily. She is mindful of her salt intake, primarily consuming packaged foods.  She experiences shortness of breath, particularly when walking and at night when getting up to use the bathroom. This has been ongoing for about three to four months. She walks as much as she can but notes that she does not go far. No chest pain is reported.  Her past medical history includes a stroke in 2017, for which she has been taking Plavix  (clopidogrel ) since the event. She also has a history of hypertension, for which she takes amlodipine 10 mg, losartan 25 mg, and metoprolol 100 mg. She has a history of hyperlipidemia and is currently taking Zetia. She is unsure if she has taken statins in the past and mentions a possible allergy to them.  Her family history is significant for her mother having had a massive stroke and her father having had a heart attack. She does not smoke or consume alcohol. 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          Problem List CVA -2017 HTN HLD -T chol 183, HDL 45, LDL 106, TG 183 HFpEF  CKD 3a    ROS: All other ROS reviewed and negative. Pertinent positives noted in the HPI.     Studies Reviewed: Kristen Barry       TTE 02/27/2016 - Left ventricle: The cavity size was normal. There was mild focal    basal hypertrophy of the septum. Systolic function was normal.    The estimated ejection fraction was in the range of 60% to 65%.    Wall motion was normal; there were no regional wall motion    abnormalities. Doppler parameters are consistent with abnormal    left ventricular relaxation (grade 1 diastolic dysfunction).  - Aortic valve: Trileaflet; mildly calcified leaflets. There was    trivial regurgitation.  - Mitral valve: Calcified annulus.  - Left atrium: The atrium was at the upper limits of normal in    size.  - Atrial septum: No defect or patent foramen ovale was identified.  - Tricuspid valve: There was trivial regurgitation.  - Pulmonary arteries: Systolic pressure could not be accurately    estimated.  - Pericardium, extracardiac: A prominent pericardial fat pad was    present.  Physical Exam:   VS:  BP (!) 146/72 (BP Location: Right Arm, Patient Position: Sitting, Cuff Size:  Normal)   Pulse (!) 54   Ht 5\' 2"  (1.575 m)   SpO2 98%   BMI 39.14 kg/m    Wt Readings from Last 3 Encounters:  10/12/23 214 lb (97.1 kg)  09/13/22 206 lb 12.8 oz (93.8 kg)  09/13/19 212 lb (96.2 kg)    GEN: Well nourished, well developed in no acute distress NECK: No JVD; No carotid bruits CARDIAC: RRR, no murmurs, rubs, gallops RESPIRATORY:  Clear to auscultation without rales, wheezing or rhonchi  ABDOMEN: Soft, non-tender, non-distended EXTREMITIES:  trace edema  ASSESSMENT AND PLAN: .   Assessment and Plan    Diastolic heart failure Likely HFpEF with borderline NT proBNP. Clear lungs and chest X-ray. Minimal edema due to salt and fluid  retention. Normal EKG and negative for myocardial infarction. - Prescribe Lasix  40 mg daily. - Prescribe potassium 20 mEq daily with Lasix . - Order echocardiogram to evaluate heart function. Stress test pending echo results.  - Encourage exercise and salt reduction.  Hypertension Blood pressure slightly elevated at 146/74. On amlodipine, losartan, and metoprolol. - Advise to monitor blood pressure closely.  Stroke Stroke in 2017. On clopidogrel  for prevention. No new neurological symptoms. - Continue clopidogrel  (Plavix ).  Hyperlipidemia On Zetia. Uncertain past statin use due to possible allergy. - Continue Zetia. - Consider retrial of statin but reluctant to do this today.              Follow-up: Return in about 3 months (around 01/24/2024).   Signed, Gigi Kyle. Rolm Clos, MD, [facility]  [facility]  [address] [place], [place] [address] [phone]  9:57 AM

## 2023-10-24 ENCOUNTER — Encounter: Payer: Self-pay | Admitting: Cardiovascular Disease

## 2023-10-24 ENCOUNTER — Ambulatory Visit: Attending: Cardiovascular Disease | Admitting: Cardiovascular Disease

## 2023-10-24 VITALS — BP 146/72 | HR 54 | Ht 62.0 in

## 2023-10-24 DIAGNOSIS — R600 Localized edema: Secondary | ICD-10-CM

## 2023-10-24 DIAGNOSIS — R0602 Shortness of breath: Secondary | ICD-10-CM | POA: Diagnosis not present

## 2023-10-24 DIAGNOSIS — I5032 Chronic diastolic (congestive) heart failure: Secondary | ICD-10-CM

## 2023-10-24 MED ORDER — POTASSIUM CHLORIDE CRYS ER 20 MEQ PO TBCR: 20.0000 meq | EXTENDED_RELEASE_TABLET | Freq: Every day | ORAL | 3 refills | Status: DC

## 2023-10-24 MED ORDER — FUROSEMIDE 20 MG PO TABS
20.0000 mg | ORAL_TABLET | Freq: Every day | ORAL | 3 refills | Status: DC
Start: 2023-10-24 — End: 2024-04-21

## 2023-10-24 NOTE — Patient Instructions (Addendum)
 Medication Instructions:   Lasix  ( furosemide  ) 20 mg  daily  Potassium 20 meq daily    *If you need a refill on your cardiac medications before your next appointment, please call your pharmacy*   Lab Work: Not needed    Testing/Procedures: Your physician has requested that you have an echocardiogram. Echocardiography is a painless test that uses sound waves to create images of your heart. It provides your doctor with information about the size and shape of your heart and how well your heart's chambers and valves are working. This procedure takes approximately one hour. There are no restrictions for this procedure. Please do NOT wear cologne, perfume, aftershave, or lotions (deodorant is allowed). Please arrive 15 minutes prior to your appointment time.  Please note: We ask at that you not bring children with you during ultrasound (echo/ vascular) testing. Due to room size and safety concerns, children are not allowed in the ultrasound rooms during exams. Our front office staff cannot provide observation of children in our lobby area while testing is being conducted. An adult accompanying a patient to their appointment will only be allowed in the ultrasound room at the discretion of the ultrasound technician under special circumstances. We apologize for any inconvenience.    Follow-Up: At [facility], you and your health needs are our priority.  As part of our continuing mission to provide you with exceptional heart care, we have created designated Provider Care Teams.  These Care Teams include your primary Cardiologist (physician) and Advanced Practice Providers (APPs -  Physician Assistants and Nurse Practitioners) who all work together to provide you with the care you need, when you need it.     Your next appointment:   3 to 4 month(s)  The format for your next appointment:   In Person  Provider:   Oneil Bigness, MD

## 2023-10-27 DIAGNOSIS — E782 Mixed hyperlipidemia: Secondary | ICD-10-CM | POA: Diagnosis not present

## 2023-10-27 DIAGNOSIS — N1831 Chronic kidney disease, stage 3a: Secondary | ICD-10-CM | POA: Diagnosis not present

## 2023-10-27 DIAGNOSIS — I10 Essential (primary) hypertension: Secondary | ICD-10-CM | POA: Diagnosis not present

## 2023-11-26 DIAGNOSIS — I10 Essential (primary) hypertension: Secondary | ICD-10-CM | POA: Diagnosis not present

## 2023-11-26 DIAGNOSIS — E782 Mixed hyperlipidemia: Secondary | ICD-10-CM | POA: Diagnosis not present

## 2023-11-26 DIAGNOSIS — N1831 Chronic kidney disease, stage 3a: Secondary | ICD-10-CM | POA: Diagnosis not present

## 2023-12-06 ENCOUNTER — Ambulatory Visit (HOSPITAL_COMMUNITY)
Admission: RE | Admit: 2023-12-06 | Discharge: 2023-12-06 | Disposition: A | Discharge status: Home / Self Care | Source: Ambulatory Visit | Attending: Internal Medicine | Admitting: Internal Medicine

## 2023-12-06 DIAGNOSIS — R600 Localized edema: Secondary | ICD-10-CM | POA: Diagnosis not present

## 2023-12-06 DIAGNOSIS — I5032 Chronic diastolic (congestive) heart failure: Secondary | ICD-10-CM | POA: Diagnosis not present

## 2023-12-06 DIAGNOSIS — R0602 Shortness of breath: Secondary | ICD-10-CM | POA: Diagnosis not present

## 2023-12-06 LAB — ECHOCARDIOGRAM COMPLETE
AR max vel: 1.65 cm2
AV Area VTI: 1.44 cm2
AV Area mean vel: 1.47 cm2
AV Mean grad: 14.5 mmHg
AV Peak grad: 24 mmHg
Ao pk vel: 2.45 m/s
Area-P 1/2: 3.99 cm2
S' Lateral: 2.69 cm

## 2023-12-06 MED ORDER — PERFLUTREN LIPID MICROSPHERE
1.0000 mL | INTRAVENOUS | Status: AC | PRN
  Administered 2023-12-06: 2 mL via INTRAVENOUS

## 2023-12-09 ENCOUNTER — Ambulatory Visit: Payer: Self-pay | Admitting: Cardiovascular Disease

## 2023-12-27 DIAGNOSIS — N1831 Chronic kidney disease, stage 3a: Secondary | ICD-10-CM | POA: Diagnosis not present

## 2023-12-27 DIAGNOSIS — I10 Essential (primary) hypertension: Secondary | ICD-10-CM | POA: Diagnosis not present

## 2023-12-27 DIAGNOSIS — E782 Mixed hyperlipidemia: Secondary | ICD-10-CM | POA: Diagnosis not present

## 2024-01-27 DIAGNOSIS — I10 Essential (primary) hypertension: Secondary | ICD-10-CM | POA: Diagnosis not present

## 2024-01-27 DIAGNOSIS — N1831 Chronic kidney disease, stage 3a: Secondary | ICD-10-CM | POA: Diagnosis not present

## 2024-01-27 DIAGNOSIS — E782 Mixed hyperlipidemia: Secondary | ICD-10-CM | POA: Diagnosis not present

## 2024-01-29 DIAGNOSIS — I10 Essential (primary) hypertension: Secondary | ICD-10-CM | POA: Diagnosis not present

## 2024-01-29 DIAGNOSIS — Z0000 Encounter for general adult medical examination without abnormal findings: Secondary | ICD-10-CM | POA: Diagnosis not present

## 2024-01-29 DIAGNOSIS — E785 Hyperlipidemia, unspecified: Secondary | ICD-10-CM | POA: Diagnosis not present

## 2024-02-11 ENCOUNTER — Encounter: Payer: Self-pay | Admitting: Physician Assistant

## 2024-02-11 ENCOUNTER — Ambulatory Visit: Attending: Physician Assistant | Admitting: Physician Assistant

## 2024-02-11 VITALS — BP 140/60 | HR 54 | Ht 62.0 in | Wt 214.6 lb

## 2024-02-11 DIAGNOSIS — I5032 Chronic diastolic (congestive) heart failure: Secondary | ICD-10-CM

## 2024-02-11 DIAGNOSIS — E785 Hyperlipidemia, unspecified: Secondary | ICD-10-CM

## 2024-02-11 DIAGNOSIS — R600 Localized edema: Secondary | ICD-10-CM | POA: Diagnosis not present

## 2024-02-11 DIAGNOSIS — I639 Cerebral infarction, unspecified: Secondary | ICD-10-CM

## 2024-02-11 DIAGNOSIS — R0602 Shortness of breath: Secondary | ICD-10-CM | POA: Diagnosis not present

## 2024-02-11 DIAGNOSIS — I10 Essential (primary) hypertension: Secondary | ICD-10-CM

## 2024-02-11 MED ORDER — LOSARTAN POTASSIUM 50 MG PO TABS
50.0000 mg | ORAL_TABLET | Freq: Every day | ORAL | 1 refills | Status: DC
Start: 2024-02-11 — End: 2024-02-11

## 2024-02-11 MED ORDER — AMLODIPINE BESYLATE 5 MG PO TABS
5.0000 mg | ORAL_TABLET | Freq: Every day | ORAL | 3 refills | Status: DC
Start: 2024-02-11 — End: 2024-05-11

## 2024-02-11 NOTE — Telephone Encounter (Signed)
 I spoke with [facility] in [place] and asked them to cancel Losartan  50 mg prescription that was sent in today.  [facility] confirms they have an active prescription for Losartan  100 mg daily ordered by Chiquita Crecencio PIETY

## 2024-02-11 NOTE — Progress Notes (Addendum)
 Cardiology Office Note   Date:  02/11/2024  ID:  Kristen Barry, DOB Jul 16, 1940, MRN [id number] PCP: Crecencio Chiquita POUR, FNP  [facility] Providers Cardiologist:  Darryle ONEIDA Decent, MD   History of Present Illness Kristen Barry is a 83 y.o. female with a past medical history of HFpEF who presents for follow-up visit.  The patient was seen in the ER 10/12/2023 with indeterminate pro NT BNP for age.  CXR was negative.  She presented to the ER for lower extremity edema.  She was seen Oct 12, 2023 for swelling in her feet and prescribed Lasix  which helped with the edema.  Monitors her weight daily noting fluctuation from 211 pounds in the morning 214 pounds at night he consumes about 64 ounces of water daily.  She is mindful of her salt intake and primarily consumes packaged foods.  She also endorsed shortness of breath at her last office visit 10/24/2023.  She noticed this when walking and at night when she gets up to use the bathroom.  This has been going on for about 3 to 4 months.  She walks as much she can but notes that she can go that far.  No chest pain reported.  Past medical history includes stroke in 2017 for which she has been taking Plavix  since the event.  History of hypertension for which she takes amlodipine  10 mg daily, losartan  25 mg daily, and metoprolol 100 mg daily.  She also has a history of hyperlipidemia currently taking Zetia.  She is unsure if she is taking statins in the past and mentions possible allergy to them.  Family history significant for mother having a massive stroke father having heart attack.  She does not smoke or consume alcohol.  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  Today, she has a hx of aortic stenosis who presents for cardiovascular follow-up.  An echocardiogram on July 10th shows normal heart pump function and diastolic parameters with mild to moderate aortic stenosis. She  denies lightheadedness, dizziness, or syncope.  She experiences ankle swelling, primarily in the left foot, which persists despite adjusting the timing of her amlodipine  dose. She takes amlodipine  10 mg, losartan  25 mg, and metoprolol 100 mg daily. There are no injuries to the left side to explain the swelling.  She experiences occasional shortness of breath, limiting her activity level. She can perform daily activities such as walking her dog, going to the mailbox, and grocery shopping but sometimes becomes short of breath. She denies any history of COPD or asthma.  Her current medications include Plavix  75 mg, Lasix  20 mg, potassium 20 mg, fish oil, and zetia for cholesterol. She does not frequently use meclizine for dizziness.  Reports no shortness of breath nor dyspnea on exertion. Reports no chest pain, pressure, or tightness. No edema, orthopnea, PND. Reports no palpitations.   Discussed the use of AI scribe software for clinical note transcription with the patient, who gave verbal consent to proceed.   ROS: pertinent ROS in HPI  Studies Reviewed     Echo 12/06/23  IMPRESSIONS     1. Left ventricular ejection fraction, by estimation, is 60 to 65%. The  left ventricle has normal function. The left ventricle has no regional  wall motion abnormalities. There is mild left ventricular hypertrophy.  Left ventricular diastolic parameters  were normal.   2. Right ventricular systolic function is normal. The right ventricular  size is normal. Tricuspid regurgitation signal  is inadequate for assessing  PA pressure.   3. The mitral valve is normal in structure. Trivial mitral valve  regurgitation. No evidence of mitral stenosis.   4. The aortic valve is tricuspid. There is moderate calcification of the  aortic valve. Aortic valve regurgitation is not visualized. Mild to  moderate aortic valve stenosis. Mild AS by gradients (Vmax 2.6 m/s, MG  ), moderate by AVA (1.3cm^2) and DI    (0.42)   5. The inferior vena cava is normal in size with greater than 50%  respiratory variability, suggesting right atrial pressure of 3 mmHg.    Physical Exam VS:  BP (!) 140/60   Pulse (!) 54   Ht 5' 2 (1.575 m)   Wt 214 lb 9.6 oz (97.3 kg)   SpO2 97%   BMI 39.25 kg/m        Wt Readings from Last 3 Encounters:  02/11/24 214 lb 9.6 oz (97.3 kg)  10/12/23 214 lb (97.1 kg)  09/13/22 206 lb 12.8 oz (93.8 kg)    GEN: Well nourished, well developed in no acute distress NECK: No JVD; No carotid bruits CARDIAC: RRR, + SEM, rubs, gallops RESPIRATORY:  Clear to auscultation without rales, wheezing or rhonchi  ABDOMEN: Soft, non-tender, non-distended EXTREMITIES:  No edema; No deformity   ASSESSMENT AND PLAN  Aortic valve stenosis, mild to moderate Mild to moderate stenosis with mild calcification, asymptomatic, normal LV function. - Monitor with annual echocardiograms. - Instruct to report symptoms such as lightheadedness, dizziness, or syncope immediately.  Essential hypertension Hypertension managed with amlodipine , losartan , and metoprolol. Medication adjustment needed due to edema. Metoprolol maintained for heart rate control. - Adjust amlodipine  to 5 mg and losartan  to 50 mg. - Continue metoprolol 100 mg daily. - Monitor blood pressure at home for two weeks after medication changes.  Peripheral edema, likely medication-related Peripheral edema likely due to amlodipine . - Reduce amlodipine  to 5 mg daily. - Increase losartan  to 50 mg daily. - Monitor blood pressure at home for two weeks. - Consider increasing Lasix  temporarily if edema persists.  Shortness of breath, multifactorial Intermittent shortness of breath, no cardiac cause identified. - Encourage gradual increase in physical activity as tolerated.  Hyperlipidemia Last lipid panel outdated. - Request lipid panel during upcoming primary care appointment in December.  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      Dispo: She can follow-up in 6 months with Dr. Barbaraann.   Signed, Orren LOISE Fabry, PA-C

## 2024-02-11 NOTE — Patient Instructions (Addendum)
 Thank you for choosing [facility]!     Medication Instructions:  DECREASE the Amlodipine  to 5mg . Take one tablet daily.  INCREASE the Losartan  to 50mg . Take one tablet daily at bedtime.  Monitor your blood pressure. Take one hour after blood pressure medication. Blood pressure log provided. Upload copy to your mychart of blood pressure readings in 2 weeks  *If you need a refill on your cardiac medications before your next appointment, please call your pharmacy*   Lab Work: Have LIPID panel drawn in December with your primary care provider. This is a fasting lab draw.  If you have labs (blood work) drawn today and your tests are completely normal, you will receive your results only by: MyChart Message (if you have MyChart) OR A paper copy in the mail If you have any lab test that is abnormal or we need to change your treatment, we will call you to review the results.   Testing/Procedures: No procedures were ordered during today's visit.   Your next appointment:   6 month(s)   Provider:   Darryle ONEIDA Decent, MD     Follow-Up: At [facility], you and your health needs are our priority.  As part of our continuing mission to provide you with exceptional heart care, we have created designated Provider Care Teams.  These Care Teams include your primary Cardiologist (physician) and Advanced Practice Providers (APPs -  Physician Assistants and Nurse Practitioners) who all work together to provide you with the care you need, when you need it. We recommend signing up for the patient portal called MyChart.  Sign up information is provided on this After Visit Summary.  MyChart is used to connect with patients for Virtual Visits (Telemedicine).  Patients are able to view lab/test results, encounter notes, upcoming appointments, etc.  Non-urgent messages can be sent to your provider as well.   To learn more about what you can do with MyChart, go to [email or web address].       BLOOD PRESSURE READINGS TAKE YOUR BLOOD PRESSURE AT LEAST 1 HOUR AFTER TAKING YOUR MEDICATION BRING LOG WITH YOU TO YOUR FOLLOW UP APPOINTMENT FOR REVIEW   DATE TIME BP HR COMMENT DATE  TIME  BP HR COMMENT  DATE TIME BP HR COMMENT DATE  TIME  BP HR COMMENT

## 2024-02-12 ENCOUNTER — Other Ambulatory Visit (HOSPITAL_COMMUNITY): Payer: Self-pay

## 2024-02-12 MED ORDER — AMLODIPINE BESYLATE 5 MG PO TABS
5.0000 mg | ORAL_TABLET | Freq: Every day | ORAL | 3 refills | Status: DC
  Filled 2024-02-12: qty 90, 90d supply, fill #0

## 2024-02-26 DIAGNOSIS — I10 Essential (primary) hypertension: Secondary | ICD-10-CM | POA: Diagnosis not present

## 2024-02-26 DIAGNOSIS — N1831 Chronic kidney disease, stage 3a: Secondary | ICD-10-CM | POA: Diagnosis not present

## 2024-02-26 DIAGNOSIS — E782 Mixed hyperlipidemia: Secondary | ICD-10-CM | POA: Diagnosis not present

## 2024-03-01 ENCOUNTER — Encounter: Payer: Self-pay | Admitting: Physician Assistant

## 2024-03-15 DIAGNOSIS — M4310 Spondylolisthesis, site unspecified: Secondary | ICD-10-CM | POA: Diagnosis not present

## 2024-03-15 DIAGNOSIS — M5431 Sciatica, right side: Secondary | ICD-10-CM | POA: Diagnosis not present

## 2024-04-15 ENCOUNTER — Emergency Department (HOSPITAL_COMMUNITY)

## 2024-04-15 ENCOUNTER — Encounter (HOSPITAL_COMMUNITY): Payer: Self-pay | Admitting: Emergency Medicine

## 2024-04-15 ENCOUNTER — Inpatient Hospital Stay (HOSPITAL_COMMUNITY)
Admission: EM | Admit: 2024-04-15 | Discharge: 2024-04-21 | DRG: 194 | Disposition: A | Discharge status: Skilled Nursing Facility | Attending: Family Medicine | Admitting: Family Medicine

## 2024-04-15 ENCOUNTER — Other Ambulatory Visit: Payer: Self-pay

## 2024-04-15 DIAGNOSIS — Z888 Allergy status to other drugs, medicaments and biological substances status: Secondary | ICD-10-CM

## 2024-04-15 DIAGNOSIS — Z6834 Body mass index (BMI) 34.0-34.9, adult: Secondary | ICD-10-CM | POA: Diagnosis not present

## 2024-04-15 DIAGNOSIS — W19XXXA Unspecified fall, initial encounter: Secondary | ICD-10-CM

## 2024-04-15 DIAGNOSIS — N179 Acute kidney failure, unspecified: Secondary | ICD-10-CM | POA: Diagnosis present

## 2024-04-15 DIAGNOSIS — S32591A Other specified fracture of right pubis, initial encounter for closed fracture: Secondary | ICD-10-CM | POA: Diagnosis present

## 2024-04-15 DIAGNOSIS — Z833 Family history of diabetes mellitus: Secondary | ICD-10-CM

## 2024-04-15 DIAGNOSIS — R0902 Hypoxemia: Secondary | ICD-10-CM | POA: Diagnosis present

## 2024-04-15 DIAGNOSIS — K219 Gastro-esophageal reflux disease without esophagitis: Secondary | ICD-10-CM | POA: Diagnosis present

## 2024-04-15 DIAGNOSIS — E669 Obesity, unspecified: Secondary | ICD-10-CM | POA: Diagnosis present

## 2024-04-15 DIAGNOSIS — E871 Hypo-osmolality and hyponatremia: Secondary | ICD-10-CM | POA: Diagnosis not present

## 2024-04-15 DIAGNOSIS — J189 Pneumonia, unspecified organism: Secondary | ICD-10-CM | POA: Diagnosis present

## 2024-04-15 DIAGNOSIS — E6609 Other obesity due to excess calories: Secondary | ICD-10-CM | POA: Diagnosis not present

## 2024-04-15 DIAGNOSIS — E875 Hyperkalemia: Secondary | ICD-10-CM | POA: Diagnosis not present

## 2024-04-15 DIAGNOSIS — I5032 Chronic diastolic (congestive) heart failure: Secondary | ICD-10-CM | POA: Diagnosis present

## 2024-04-15 DIAGNOSIS — Z6839 Body mass index (BMI) 39.0-39.9, adult: Secondary | ICD-10-CM

## 2024-04-15 DIAGNOSIS — I10 Essential (primary) hypertension: Secondary | ICD-10-CM | POA: Diagnosis present

## 2024-04-15 DIAGNOSIS — D649 Anemia, unspecified: Secondary | ICD-10-CM | POA: Diagnosis present

## 2024-04-15 DIAGNOSIS — Z23 Encounter for immunization: Secondary | ICD-10-CM

## 2024-04-15 DIAGNOSIS — Z87891 Personal history of nicotine dependence: Secondary | ICD-10-CM

## 2024-04-15 DIAGNOSIS — E66811 Obesity, class 1: Secondary | ICD-10-CM | POA: Diagnosis not present

## 2024-04-15 DIAGNOSIS — Z90710 Acquired absence of both cervix and uterus: Secondary | ICD-10-CM

## 2024-04-15 DIAGNOSIS — E861 Hypovolemia: Secondary | ICD-10-CM | POA: Diagnosis not present

## 2024-04-15 DIAGNOSIS — J9601 Acute respiratory failure with hypoxia: Principal | ICD-10-CM | POA: Diagnosis present

## 2024-04-15 DIAGNOSIS — Z885 Allergy status to narcotic agent status: Secondary | ICD-10-CM

## 2024-04-15 DIAGNOSIS — Z8249 Family history of ischemic heart disease and other diseases of the circulatory system: Secondary | ICD-10-CM

## 2024-04-15 DIAGNOSIS — Z7902 Long term (current) use of antithrombotics/antiplatelets: Secondary | ICD-10-CM

## 2024-04-15 DIAGNOSIS — Z823 Family history of stroke: Secondary | ICD-10-CM

## 2024-04-15 DIAGNOSIS — I959 Hypotension, unspecified: Secondary | ICD-10-CM | POA: Diagnosis not present

## 2024-04-15 DIAGNOSIS — Z79899 Other long term (current) drug therapy: Secondary | ICD-10-CM

## 2024-04-15 DIAGNOSIS — G8929 Other chronic pain: Secondary | ICD-10-CM | POA: Diagnosis present

## 2024-04-15 DIAGNOSIS — I69351 Hemiplegia and hemiparesis following cerebral infarction affecting right dominant side: Secondary | ICD-10-CM

## 2024-04-15 DIAGNOSIS — Y92009 Unspecified place in unspecified non-institutional (private) residence as the place of occurrence of the external cause: Secondary | ICD-10-CM

## 2024-04-15 DIAGNOSIS — I739 Peripheral vascular disease, unspecified: Secondary | ICD-10-CM | POA: Diagnosis present

## 2024-04-15 DIAGNOSIS — I110 Hypertensive heart disease with heart failure: Secondary | ICD-10-CM | POA: Diagnosis present

## 2024-04-15 LAB — CBC WITH DIFFERENTIAL/PLATELET
Abs Immature Granulocytes: 0.03 K/uL (ref 0.00–0.07)
Basophils Absolute: 0 K/uL (ref 0.0–0.1)
Basophils Relative: 0 %
Eosinophils Absolute: 0.1 K/uL (ref 0.0–0.5)
Eosinophils Relative: 1 %
HCT: 35.2 % — ABNORMAL LOW (ref 36.0–46.0)
Hemoglobin: 11.7 g/dL — ABNORMAL LOW (ref 12.0–15.0)
Immature Granulocytes: 0 %
Lymphocytes Relative: 9 %
Lymphs Abs: 0.7 K/uL (ref 0.7–4.0)
MCH: 31 pg (ref 26.0–34.0)
MCHC: 33.2 g/dL (ref 30.0–36.0)
MCV: 93.1 fL (ref 80.0–100.0)
Monocytes Absolute: 0.9 K/uL (ref 0.1–1.0)
Monocytes Relative: 11 %
Neutro Abs: 6.4 K/uL (ref 1.7–7.7)
Neutrophils Relative %: 79 %
Platelets: 178 K/uL (ref 150–400)
RBC: 3.78 MIL/uL — ABNORMAL LOW (ref 3.87–5.11)
RDW: 12.4 % (ref 11.5–15.5)
WBC: 8.2 K/uL (ref 4.0–10.5)
nRBC: 0 % (ref 0.0–0.2)

## 2024-04-15 LAB — RESP PANEL BY RT-PCR (RSV, FLU A&B, COVID)  RVPGX2
Influenza A by PCR: NEGATIVE
Influenza B by PCR: NEGATIVE
Resp Syncytial Virus by PCR: NEGATIVE
SARS Coronavirus 2 by RT PCR: NEGATIVE

## 2024-04-15 LAB — BASIC METABOLIC PANEL WITH GFR
Anion gap: 10 (ref 5–15)
BUN: 32 mg/dL — ABNORMAL HIGH (ref 8–23)
CO2: 28 mmol/L (ref 22–32)
Calcium: 9.4 mg/dL (ref 8.9–10.3)
Chloride: 86 mmol/L — ABNORMAL LOW (ref 98–111)
Creatinine, Ser: 1.81 mg/dL — ABNORMAL HIGH (ref 0.44–1.00)
GFR, Estimated: 27 mL/min — ABNORMAL LOW (ref >60)
Glucose, Bld: 80 mg/dL (ref 70–99)
Potassium: 4.8 mmol/L (ref 3.5–5.1)
Sodium: 124 mmol/L — ABNORMAL LOW (ref 135–145)

## 2024-04-15 LAB — URINALYSIS, ROUTINE W REFLEX MICROSCOPIC
Bilirubin Urine: NEGATIVE
Glucose, UA: NEGATIVE mg/dL
Hgb urine dipstick: NEGATIVE
Ketones, ur: NEGATIVE mg/dL
Leukocytes,Ua: NEGATIVE
Nitrite: NEGATIVE
Protein, ur: NEGATIVE mg/dL
Specific Gravity, Urine: 1.009 (ref 1.005–1.030)
pH: 5 (ref 5.0–8.0)

## 2024-04-15 LAB — GLUCOSE, CAPILLARY
Glucose-Capillary: 63 mg/dL — ABNORMAL LOW (ref 70–99)
Glucose-Capillary: 67 mg/dL — ABNORMAL LOW (ref 70–99)

## 2024-04-15 LAB — PRO BRAIN NATRIURETIC PEPTIDE: Pro Brain Natriuretic Peptide: 2824 pg/mL — ABNORMAL HIGH (ref <300.0)

## 2024-04-15 MED ORDER — SODIUM CHLORIDE 0.9 % IV BOLUS
500.0000 mL | Freq: Once | INTRAVENOUS | Status: AC
  Administered 2024-04-15: 500 mL via INTRAVENOUS

## 2024-04-15 MED ORDER — ONDANSETRON HCL 4 MG/2ML IJ SOLN: 4.0000 mg | Freq: Four times a day (QID) | INTRAMUSCULAR | Status: DC | PRN

## 2024-04-15 MED ORDER — SODIUM CHLORIDE 0.9 % IV SOLN
2.0000 g | INTRAVENOUS | Status: AC
  Administered 2024-04-15 – 2024-04-19 (×5): 2 g via INTRAVENOUS
  Filled 2024-04-15 (×5): qty 20

## 2024-04-15 MED ORDER — GUAIFENESIN-DM 100-10 MG/5ML PO SYRP: 15.0000 mL | ORAL_SOLUTION | Freq: Three times a day (TID) | ORAL | Status: DC | PRN

## 2024-04-15 MED ORDER — ACETAMINOPHEN 650 MG RE SUPP: 650.0000 mg | Freq: Four times a day (QID) | RECTAL | Status: DC | PRN

## 2024-04-15 MED ORDER — ONDANSETRON HCL 4 MG PO TABS
4.0000 mg | ORAL_TABLET | Freq: Four times a day (QID) | ORAL | Status: DC | PRN
  Administered 2024-04-18 – 2024-04-19 (×3): 4 mg via ORAL
  Filled 2024-04-15 (×3): qty 1

## 2024-04-15 MED ORDER — HYDROCODONE-ACETAMINOPHEN 5-325 MG PO TABS
1.0000 | ORAL_TABLET | Freq: Once | ORAL | Status: AC
  Administered 2024-04-15: 1 via ORAL
  Filled 2024-04-15: qty 1

## 2024-04-15 MED ORDER — HYDROCODONE-ACETAMINOPHEN 5-325 MG PO TABS
1.0000 | ORAL_TABLET | Freq: Four times a day (QID) | ORAL | Status: DC | PRN
Start: 2024-04-15 — End: 2024-04-16
  Administered 2024-04-15 – 2024-04-16 (×2): 1 via ORAL
  Filled 2024-04-15: qty 1

## 2024-04-15 MED ORDER — ALBUTEROL SULFATE HFA 108 (90 BASE) MCG/ACT IN AERS
2.0000 | INHALATION_SPRAY | Freq: Once | RESPIRATORY_TRACT | Status: AC
  Administered 2024-04-15: 2 via RESPIRATORY_TRACT

## 2024-04-15 MED ORDER — HEPARIN SODIUM (PORCINE) 5000 UNIT/ML IJ SOLN
5000.0000 [IU] | Freq: Three times a day (TID) | INTRAMUSCULAR | Status: DC
  Administered 2024-04-15 – 2024-04-21 (×17): 5000 [IU] via SUBCUTANEOUS
  Filled 2024-04-15 (×17): qty 1

## 2024-04-15 MED ORDER — SODIUM CHLORIDE 0.9 % IV SOLN
500.0000 mg | INTRAVENOUS | Status: DC
  Administered 2024-04-15 – 2024-04-17 (×3): 500 mg via INTRAVENOUS
  Filled 2024-04-15 (×3): qty 5

## 2024-04-15 MED ORDER — ACETAMINOPHEN 325 MG PO TABS
650.0000 mg | ORAL_TABLET | Freq: Once | ORAL | Status: AC
  Administered 2024-04-15: 650 mg via ORAL
  Filled 2024-04-15: qty 2

## 2024-04-15 MED ORDER — ALBUTEROL SULFATE (2.5 MG/3ML) 0.083% IN NEBU
2.5000 mg | INHALATION_SOLUTION | RESPIRATORY_TRACT | Status: DC | PRN
  Administered 2024-04-16 – 2024-04-17 (×2): 2.5 mg via RESPIRATORY_TRACT
  Filled 2024-04-15 (×2): qty 3

## 2024-04-15 MED ORDER — CLOPIDOGREL BISULFATE 75 MG PO TABS
75.0000 mg | ORAL_TABLET | Freq: Every day | ORAL | Status: DC
  Administered 2024-04-16 – 2024-04-21 (×6): 75 mg via ORAL
  Filled 2024-04-15 (×6): qty 1

## 2024-04-15 MED ORDER — ACETAMINOPHEN 325 MG PO TABS: 650.0000 mg | ORAL_TABLET | Freq: Four times a day (QID) | ORAL | Status: DC | PRN

## 2024-04-15 MED ORDER — POLYETHYLENE GLYCOL 3350 17 G PO PACK
17.0000 g | PACK | Freq: Every day | ORAL | Status: DC | PRN
Start: 2024-04-15 — End: 2024-04-21

## 2024-04-15 MED ORDER — SODIUM CHLORIDE 0.9 % IV SOLN: INTRAVENOUS | Status: AC

## 2024-04-15 NOTE — H&P (Addendum)
 History and Physical    TIA Kristen Barry:[id number] DOB: 1940/10/29 DOA: 04/15/2024  PCP: Crecencio Chiquita POUR, FNP   Patient coming from: Home  I have personally briefly reviewed patient's old medical records in [facility]  Chief Complaint: Fall  HPI: Kristen Barry is a 83 y.o. female with medical history significant for CVA, hypertension, CHF, peripheral artery disease, stroke. Patient presented to the ED with reports of a fall that occurred about a week and a half ago.  Distention has had persistent pain to her right groin, today she slid out of bed onto the floor, she did not fall.  She reports wheezing over the past 4 days, with dry cough that was noticed today.  No difficulty breathing.  No lower extremity swelling.  No dizziness.  No abdominal bloating.  She reports good oral intake, no vomiting no diarrhea.  Has been compliant with Lasix  20 mg daily, but family has also ensure that she stays hydrated.   ED Course: Temperature 98.2.  Heart rate 69-73.  Respirate rate 18.  Blood pressure systolic 97-116.  O2 sats high 80s on room air, placed on 2 L sats currently greater than 95%. Sodium 124. UA not suggestive of UTI. proBNP elevated at 2824. Creatinine elevated 1.8. Pelvic x-ray with right inferior and superior rib my fracture. Chest x-ray shows left midlung and basilar opacities-atelectasis or pneumonia. 500 mL bolus given.   Hospitalist to admit for acute hypoxic respiratory failure, AKI, hyponatremia, fall and fracture.  Review of Systems: As per HPI all other systems reviewed and negative.  Past Medical History:  Diagnosis Date   Abnormality of gait 04/24/2016   Carpal tunnel syndrome    Cerebral artery occlusion with cerebral infarction [facility])    CVA (cerebral infarction) 02/25/2016   Diverticulosis    Environmental allergies    GERD (gastroesophageal reflux disease)    H/O Bell's palsy    Hypertension    Insomnia    Laryngopharyngeal reflux    Numbness in  right leg    OA (osteoarthritis)    Obesity 02/25/2016   Osteoarthritis    Osteoarthrosis    Osteopenia    PAD (peripheral artery disease)    Paresthesia of skin    Right sided weakness    Seasonal allergies    Slurred speech 02/25/2016   SOB (shortness of breath)    Stroke [facility])     Past Surgical History:  Procedure Laterality Date   ABDOMINAL HYSTERECTOMY     EYE SURGERY       reports that she has quit smoking. She has never used smokeless tobacco. She reports that she does not drink alcohol and does not use drugs.  Allergies  Allergen Reactions   Ace Inhibitors Cough        Codeine Other (See Comments)    Stomach upset     Family History  Problem Relation Age of Onset   CVA Mother    Diabetes Mother    Osteoarthritis Mother    Hypertension Mother    CAD Father    Heart Problems Brother        stent   Heart attack Brother        2-3 caths   Melanoma Paternal Aunt     Prior to Admission medications   Medication Sig Start Date End Date Taking? Authorizing Provider  acetaminophen  (TYLENOL ) 500 MG tablet Take 1,000 mg by mouth in the morning and at bedtime.   Yes [provider]  amLODipine  (  NORVASC ) 5 MG tablet Take 1 tablet (5 mg total) by mouth daily. Patient taking differently: Take 5 mg by mouth at bedtime. 02/12/24 05/12/24 Yes Lucien Orren SAILOR, PA-C  celecoxib (CELEBREX) 200 MG capsule Take 200 mg by mouth daily.   Yes [provider]  chlorthalidone (HYGROTON) 50 MG tablet Take 50 mg by mouth daily.   Yes [provider]  cholecalciferol (VITAMIN D) 1000 UNITS tablet Take 1,000 Units by mouth daily.   Yes [provider]  clopidogrel  (PLAVIX ) 75 MG tablet Take 1 tablet (75 mg total) by mouth daily. 03/02/16  Yes Krishnan, Sendil K, MD  cyclobenzaprine (FLEXERIL) 5 MG tablet Take 5 mg by mouth 3 (three) times daily as needed for muscle spasms. 04/14/24  Yes [provider]  ezetimibe (ZETIA) 10 MG tablet Take 10 mg  by mouth daily. 07/20/22  Yes [provider]  furosemide  (LASIX ) 20 MG tablet Take 1 tablet (20 mg total) by mouth daily. 10/24/23 04/15/24 Yes O'Neal, Darryle Ned, MD  gabapentin (NEURONTIN) 600 MG tablet Take 600-1,200 mg by mouth 2 (two) times daily. 600 mg am, 1200 mg pm   Yes [provider]  losartan  (COZAAR ) 100 MG tablet Take 100 mg by mouth daily.   Yes [provider]  magnesium gluconate (MAGONATE) 500 (27 Mg) MG TABS tablet Take 500 mg by mouth daily.   Yes [provider]  meclizine (ANTIVERT) 25 MG tablet Take 25 mg by mouth as needed for dizziness.   Yes [provider]  metoprolol succinate (TOPROL-XL) 100 MG 24 hr tablet Take 100 mg by mouth daily. Take with or immediately following a meal.   Yes [provider]  Multiple Vitamin (MULTIVITAMIN) tablet Take 1 tablet by mouth daily.   Yes [provider]  Omega-3 Fatty Acids (FISH OIL) 1000 MG CAPS Take 1 capsule by mouth in the morning and at bedtime.   Yes [provider]  omeprazole (PRILOSEC) 40 MG capsule Take 40 mg by mouth every evening.   Yes [provider]  ondansetron  (ZOFRAN -ODT) 4 MG disintegrating tablet Take 4 mg by mouth every 8 (eight) hours as needed for nausea or vomiting.   Yes [provider]  oxyCODONE (OXY IR/ROXICODONE) 5 MG immediate release tablet Take 5 mg by mouth in the morning, at noon, and at bedtime. 04/14/24  Yes [provider]  potassium chloride  SA (KLOR-CON  M) 20 MEQ tablet Take 1 tablet (20 mEq total) by mouth daily. 10/24/23  Yes O'Neal, Darryle Ned, MD  vitamin B-12 (CYANOCOBALAMIN) 500 MCG tablet Take 1,000 mcg by mouth daily.   Yes [provider]    Physical Exam: Vitals:   04/15/24 1144 04/15/24 1144 04/15/24 1631 04/15/24 1855  BP:    (!) 97/43  Pulse:    64  Resp: 18     Temp: 98.2 F (36.8 C)  98.2 F (36.8 C) 98.3 F (36.8 C)  TempSrc: Oral  Oral Oral  SpO2:  96%  99%   Weight:      Height:        Constitutional: NAD, calm, comfortable Vitals:   04/15/24 1144 04/15/24 1144 04/15/24 1631 04/15/24 1855  BP:    (!) 97/43  Pulse:    64  Resp: 18     Temp: 98.2 F (36.8 C)  98.2 F (36.8 C) 98.3 F (36.8 C)  TempSrc: Oral  Oral Oral  SpO2:  96%  99%  Weight:      Height:  Eyes: PERRL, lids and conjunctivae normal ENMT: Mucous membranes are moist.   Neck: normal, supple, no masses, no thyromegaly Respiratory: Crackles present left base, no wheezing at this time, Normal respiratory effort. No accessory muscle use.  Cardiovascular: Regular rate and rhythm, 3/6 known systolic murmurs /no rubs / gallops. No extremity edema.  Extremities warm. Abdomen: Distended but normal per patient, no tenderness, no masses palpated. Musculoskeletal: no clubbing / cyanosis. No joint deformity upper and lower extremities.  Skin: no rashes, lesions, ulcers. No induration Neurologic: No facial asymmetry, moving extremity spontaneously-right extremity not tested due to pain, speech fluent.SABRA  Psychiatric: Normal judgment and insight. Alert and oriented x 3. Normal mood.   Labs on Admission: I have personally reviewed following labs and imaging studies  CBC: Recent Labs  Lab 04/15/24 1242  WBC 8.2  NEUTROABS 6.4  HGB 11.7*  HCT 35.2*  MCV 93.1  PLT 178   Basic Metabolic Panel: Recent Labs  Lab 04/15/24 1242  NA 124*  K 4.8  CL 86*  CO2 28  GLUCOSE 80  BUN 32*  CREATININE 1.81*  CALCIUM  9.4   BNP (last 3 results) Recent Labs    10/12/23 1445 04/15/24 1241  PROBNP 1,163.0* 2,824.0*   Urine analysis:    Component Value Date/Time   COLORURINE YELLOW 04/15/2024 1520   APPEARANCEUR CLEAR 04/15/2024 1520   LABSPEC 1.009 04/15/2024 1520   PHURINE 5.0 04/15/2024 1520   GLUCOSEU NEGATIVE 04/15/2024 1520   HGBUR NEGATIVE 04/15/2024 1520   BILIRUBINUR NEGATIVE 04/15/2024 1520   KETONESUR NEGATIVE 04/15/2024 1520   PROTEINUR NEGATIVE 04/15/2024  1520   NITRITE NEGATIVE 04/15/2024 1520   LEUKOCYTESUR NEGATIVE 04/15/2024 1520    Radiological Exams on Admission: DG Chest Portable 1 View Result Date: 04/15/2024 EXAM: 1 VIEW(S) XRAY OF THE CHEST 04/15/2024 03:05:03 PM COMPARISON: Comparison 10/12/2023. CLINICAL HISTORY: SOB. FINDINGS: LUNGS AND PLEURA: Left mid lung and basilar densities are noted concerning for atelectasis possibly pneumonia. Small left pleural effusion is noted. No pneumothorax. HEART AND MEDIASTINUM: Stable cardiomegaly. BONES AND SOFT TISSUES: No acute osseous abnormality. IMPRESSION: 1. Left mid lung and basilar opacities, concerning for atelectasis or pneumonia. 2. Small left pleural effusion. 3. Stable cardiomegaly. Electronically signed by: Lynwood Seip MD 04/15/2024 03:28 PM EST RP Workstation: [id number]   DG Lumbar Spine Complete Result Date: 04/15/2024 EXAM: 4 VIEW(S) XRAY OF THE LUMBAR SPINE 04/15/2024 12:49:20 PM COMPARISON: Lumbar spine 06/08/2018. CLINICAL HISTORY: fall FINDINGS: LUMBAR SPINE: BONES: Chronic superior endplate compression fracture at L2. No acute fracture. No aggressive appearing osseous lesion. DISCS AND DEGENERATIVE CHANGES: Chronic anterolisthesis L4 on L5 by 10 mm. SOFT TISSUES: No acute abnormality. IMPRESSION: 1. No acute findings in the lumbar spine. Electronically signed by: Norleen Boxer MD 04/15/2024 01:36 PM EST RP Workstation: [id number]   DG Hip Unilat W or Wo Pelvis 2-3 Views Right Result Date: 04/15/2024 EXAM: 2 or 3 VIEW(S) XRAY OF THE RIGHT HIP 04/15/2024 12:49:20 PM COMPARISON: 05/20/2018 CLINICAL HISTORY: fall FINDINGS: BONES AND JOINTS: Mildly displaced fractures are seen involving the right inferior and superior pubic rami. The visualized proximal femur is unremarkable. The hip joint is maintained. No significant degenerative changes. SOFT TISSUES: The soft tissues are unremarkable. IMPRESSION: 1. Mildly displaced fractures of the right inferior and superior pubic rami.  Electronically signed by: Lynwood Seip MD 04/15/2024 01:15 PM EST RP Workstation: [id number]   EKG: Independently reviewed.  Ectopic atrial rhythm, rhythm regular, rate 69, QTc 432.  No significant change from prior.  Assessment/Plan Principal Problem:  Acute hypoxemic respiratory failure (HCC) Active Problems:   PNA (pneumonia)   Fall at home, initial encounter   AKI (acute kidney injury)   Essential hypertension   Obesity  Assessment and Plan:  Acute hypoxic respiratory failure/Pneumonia-presenting with wheezing, dry cough, O2 sats down to the high 80s on room air, placed on 2 L sats greater 95%.  Chest x-ray- Left mid lung and basilar opacities, concerning for atelectasis or pneumonia.  Rules out for sepsis.  WBC 8.2.  Afebrile.  Lung exam clear on my evaluation.  No history of lung disease or tobacco abuse. - IV ceftriaxone and azithromycin - Albuterol nebs as needed - Check COVID influenza RSV  Acute kidney injury-creatinine 1.8, last checked 6 months ago was 1.1.  She has maintained good oral intake, no GI losses.  Has been compliant with Lasix .  Presumed prerenal etiology at this time, with ARBs contributing to AKI. - N/s bolus given, continue N/s 75cc/hr x 20hrs - Hold Lasix , chlorthalidone 50 mg, losartan  100 mg,  Chronic diastolic CHF-appears stable.  No lower extremity swelling, abdomen is distended, but per patient at baseline.  Chest x-ray no suggestion of volume overload.  BNP is elevated at 2824.  Recent echo 11/2023 EF 60 to 65%, mild to moderate aortic valve stenosis. - Hold Lasix  20 mg daily for now and chlorthalidone  Hyponatremia-sodium 124, baseline mid 30s.  Likely from diuretics. - Gentle hydration  Hypertension -Blood pressure currently soft, systolic ranging from 97-116. - Hold Lasix , Norvasc  5 mg, chlorthalidone 50, losartan  100, metoprolol 100  Fall/pubic rami fractures- no acute findings in the lumbar spine.  Pelvic x-ray- Mildly displaced fractures  of the right inferior and superior pubic rami. - EDP talked to orthopedics, Dr. Onesimo regarding pubic rami fractures, these are non operable  - Hydrocodone as needed  Peripheral artery disease - Resume Plavix    DVT prophylaxis: Heparin Code Status: FULL-confirmed with patient, and family at bedside. Family Communication: Optometrist at bedside, and later patient's son Daril at bedside Disposition Plan: ~/> 2 days Consults called:  None Admission status: Inpt tele I certify that at the point of admission it is my clinical judgment that the patient will require inpatient hospital care spanning beyond 2 midnights from the point of admission due to high intensity of service, high risk for further deterioration and high frequency of surveillance required.    Author: Tully FORBES Carwin, MD 04/15/2024 7:44 PM  For on call review [email or web address].

## 2024-04-15 NOTE — ED Triage Notes (Signed)
 Pt brought in by [facility] for fall w/ pain in the upper right leg, lateral area. Associated pain in the right groin area. Right leg has CMS fully intact, although there is pain with movement and noted difference in palpable temperature when compared to the left leg. Pt reports that her fall this morning was a slide out of her bed. She did not strike her head on anything and denies loss of consciousness. Pt is ambulatory at baseline. She is alert and oriented to her baseline.

## 2024-04-15 NOTE — ED Provider Notes (Signed)
 [place] EMERGENCY DEPARTMENT AT [facility] Provider Note   CSN: [id number] Arrival date & time: 04/15/24  1013     Patient presents with: Kristen Barry is a 83 y.o. female.    Fall Associated symptoms include shortness of breath. Pertinent negatives include no chest pain, no abdominal pain and no headaches.       Kristen Barry is a 83 y.o. female with past medical history of hypertension, right-sided weakness, prior stroke, PAD, who presents to the Emergency Department for evaluation of fall this morning.  States that she was having pain right hip right lateral thigh area.  She has been having pain to this area for a couple of weeks.  Was seen by orthopedics for this yesterday.  She was having some discomfort over her hip area this morning prior to her fall.  She attempted to stand to transfer to bedside commode she was being assisted by her spouse and states her right leg gave way causing her to slide onto the floor.  Her husband was holding onto her at the time and she endorses minimal impact.  She does not feel that her fall this morning worsened her hip pain.  She denies head injury or LOC.  She does take clopidogrel  daily.  Denies any dizziness, numbness or weakness of her lower extremities.  She does endorse some discomfort to her lower back/tailbone area.  Patient's granddaughter now at bedside and provides additional history.  She states that she had a mechanical fall a week and a half ago as well.  She is been complaining of hip pain since that time.  Prior to that fall patient was ambulatory without assistance, since that time she has been gradually declining in her ability to stand and walk and has been been only transferring from bed to bedside commode for the last several days.  Unable to bear weight without significant pain.  Granddaughter also endorses worsening audible wheezing since being bed bound and exertional dyspnea.  She does not use  supplemental oxygen at home  Prior to Admission medications   Medication Sig Start Date End Date Taking? Authorizing Provider  amLODipine  (NORVASC ) 5 MG tablet Take 1 tablet (5 mg total) by mouth daily. 02/12/24 05/12/24  Lucien Orren SAILOR, PA-C  Calcium  Carbonate-Vit D-Min (CALCIUM  1200 PO) Take by mouth daily.    [provider]  cholecalciferol  (VITAMIN D ) 1000 UNITS tablet Take 1,000 Units by mouth daily.    [provider]  clopidogrel  (PLAVIX ) 75 MG tablet Take 1 tablet (75 mg total) by mouth daily. 03/02/16   Krishnan, Sendil K, MD  ezetimibe  (ZETIA ) 10 MG tablet Take 10 mg by mouth daily. 07/20/22   [provider]  furosemide  (LASIX ) 20 MG tablet Take 1 tablet (20 mg total) by mouth daily. 10/24/23 02/11/24  O'NealDarryle Ned, MD  gabapentin  (NEURONTIN ) 600 MG tablet Take 900 mg by mouth 3 (three) times daily.     [provider]  losartan  (COZAAR ) 100 MG tablet Take 100 mg by mouth daily.    [provider]  meclizine  (ANTIVERT ) 25 MG tablet Take 25 mg by mouth as needed for dizziness.    [provider]  metoprolol  succinate (TOPROL -XL) 100 MG 24 hr tablet Take 100 mg by mouth daily. Take with or immediately following a meal.    [provider]  Omega-3 Fatty Acids (FISH OIL) 1000 MG CAPS Take by mouth daily.    [provider]  omeprazole (PRILOSEC) 40 MG capsule Take 40 mg by mouth daily.    [provider]  potassium chloride  SA (KLOR-CON  M) 20 MEQ tablet Take 1 tablet (20 mEq total) by mouth daily. 10/24/23   O'NealDarryle Ned, MD  vitamin C (ASCORBIC ACID) 500 MG tablet Take 500 mg by mouth daily.    [provider]    Allergies: Ace inhibitors and Codeine    Review of Systems  Constitutional:  Negative for chills and fever.  Respiratory:  Positive for shortness of breath and wheezing.   Cardiovascular:  Negative for chest pain and leg swelling.  Gastrointestinal:  Negative for abdominal  pain, diarrhea, nausea and vomiting.  Genitourinary:  Negative for difficulty urinating and dysuria.  Musculoskeletal:  Positive for arthralgias (Right hip pain).  Neurological:  Negative for dizziness, syncope and headaches.    Updated Vital Signs BP 116/68   Pulse 73   Ht 5' 2 (1.575 m)   Wt 97.3 kg   SpO2 95%   BMI 39.23 kg/m   Physical Exam Vitals and nursing note reviewed.  Constitutional:      General: She is not in acute distress.    Appearance: She is not toxic-appearing.  HENT:     Head: Atraumatic.  Cardiovascular:     Rate and Rhythm: Normal rate and regular rhythm.     Pulses: Normal pulses.  Pulmonary:     Effort: No respiratory distress.     Breath sounds: Wheezing present.     Comments: Expiratory wheezes bilaterally.  No increased work of breathing Abdominal:     Palpations: Abdomen is soft.     Tenderness: There is no abdominal tenderness.  Musculoskeletal:        General: Tenderness and signs of injury present. No deformity.     Right lower leg: No edema.     Left lower leg: No edema.     Comments: Pain with range of motion of the right hip.  Anterior tenderness to palpation.  I do not appreciate any shortening or external rotation.  No edema erythema or other skin changes of the lower extremity.  Skin:    General: Skin is warm.     Capillary Refill: Capillary refill takes less than 2 seconds.     Findings: No bruising or erythema.  Neurological:     General: No focal deficit present.     Mental Status: She is alert.     Sensory: No sensory deficit.     Motor: No weakness.     (all labs ordered are listed, but only abnormal results are displayed) Labs Reviewed  CBC WITH DIFFERENTIAL/PLATELET - Abnormal; Notable for the following components:      Result Value   RBC 3.78 (*)    Hemoglobin 11.7 (*)    HCT 35.2 (*)    All other components within normal limits  BASIC METABOLIC PANEL WITH GFR - Abnormal; Notable for the following components:    Sodium 124 (*)    Chloride 86 (*)    BUN 32 (*)    Creatinine, Ser 1.81 (*)    GFR, Estimated 27 (*)    All other components within normal limits  URINALYSIS, ROUTINE W REFLEX MICROSCOPIC    EKG: None  Radiology: DG Chest Portable 1 View Result Date: 04/15/2024 EXAM: 1 VIEW(S) XRAY OF THE CHEST 04/15/2024 03:05:03 PM COMPARISON: Comparison 10/12/2023. CLINICAL HISTORY: SOB. FINDINGS: LUNGS AND PLEURA: Left mid lung and basilar densities are noted concerning for atelectasis possibly pneumonia.  Small left pleural effusion is noted. No pneumothorax. HEART AND MEDIASTINUM: Stable cardiomegaly. BONES AND SOFT TISSUES: No acute osseous abnormality. IMPRESSION: 1. Left mid lung and basilar opacities, concerning for atelectasis or pneumonia. 2. Small left pleural effusion. 3. Stable cardiomegaly. Electronically signed by: Lynwood Seip MD 04/15/2024 03:28 PM EST RP Workstation: [id number]   DG Lumbar Spine Complete Result Date: 04/15/2024 EXAM: 4 VIEW(S) XRAY OF THE LUMBAR SPINE 04/15/2024 12:49:20 PM COMPARISON: Lumbar spine 06/08/2018. CLINICAL HISTORY: fall FINDINGS: LUMBAR SPINE: BONES: Chronic superior endplate compression fracture at L2. No acute fracture. No aggressive appearing osseous lesion. DISCS AND DEGENERATIVE CHANGES: Chronic anterolisthesis L4 on L5 by 10 mm. SOFT TISSUES: No acute abnormality. IMPRESSION: 1. No acute findings in the lumbar spine. Electronically signed by: Norleen Boxer MD 04/15/2024 01:36 PM EST RP Workstation: [id number]   DG Hip Unilat W or Wo Pelvis 2-3 Views Right Result Date: 04/15/2024 EXAM: 2 or 3 VIEW(S) XRAY OF THE RIGHT HIP 04/15/2024 12:49:20 PM COMPARISON: 05/20/2018 CLINICAL HISTORY: fall FINDINGS: BONES AND JOINTS: Mildly displaced fractures are seen involving the right inferior and superior pubic rami. The visualized proximal femur is unremarkable. The hip joint is maintained. No significant degenerative changes. SOFT TISSUES: The soft tissues are  unremarkable. IMPRESSION: 1. Mildly displaced fractures of the right inferior and superior pubic rami. Electronically signed by: Lynwood Seip MD 04/15/2024 01:15 PM EST RP Workstation: [id number]     Procedures   Medications Ordered in the ED - No data to display                                  Medical Decision Making   Patient here from home for evaluation of right hip pain after sliding out of bed this morning.  Patient endorses hip pain for several day, but daughter who is now at bedside states that she had a fall in the bathroom a week and a half ago and has been having gradually worsening hip pain since that time.  She was seen yesterday by orthopedics for concern of a sacral alar fracture and MRI of the pelvis was ordered outpatient.  Granddaughter also endorses some exertional dyspnea and audible wheezing.  hip pain has increased to the point where she is now only able to transfer from bed to bedside commode or with rollator.  Unable to bear weight.  Patient denies having any chest pain.  Possible CHF.  Will check labs and imaging.  Possible fracture.  Extremities are neurovascularly intact.  She has palpable dorsalis pedis pulses bilaterally.  No edema erythema to the lower extremities.  I do not appreciate any palpable temperature changes in the extremities on my exam.    Amount and/or Complexity of Data Reviewed Labs: ordered.    Details: Labs without evidence of leukocytosis, chemistry show hyponatremia with sodium of 124.  Her serum creatinine is elevated at 1.8 and her BUN is also elevated.  She had creatinine of 1.106 months ago.  Urinalysis without evidence of infection Radiology: ordered.    Details: X-ray of the lumbar spine without acute findings  X-ray of the right hip shows mildly displaced fractures of the superior and inferior pubic rami  Chest x-ray shows left midlung basilar opacities possible atelectasis versus pneumonia with small left pleural  effusion ECG/medicine tests: ordered.    Details: EKG shows ectopic atrial rhythm consider left atrial enlargement, abnormal R wave progression early transition Discussion of management or test interpretation  with external provider(s):   Patient here with right hip pain and history of fall today and fall a week and a half ago.  Granddaughter also endorses some audible wheezing for several days and exertional dyspnea worse from her baseline  She does not use supplemental oxygen at baseline.  She was seen yesterday by orthopedics for her hip pain and outpatient MRI of pelvis was recommended.  Here today due to another fall.  During the course of her workup today.  She became hypoxic into the 80s while transferring from bed to wheelchair and was placed on 2 L O2.  Audible wheezing noted.  2 puffs of albuterol  given here with improvement.  Oxygen in the mid 90s on 2 L.  Drops into the upper 80s when O2 is removed.  I spoke with orthopedics, Dr. Onesimo regarding pubic rami fractures, these are non operable, but given patient's new oxygen requirement and hyponatremia feel she will benefit from hospital admission  Discussed findings with [facility] hospitalist, Dr. Pearlean who agrees to admit     Risk OTC drugs. Prescription drug management. Decision regarding hospitalization.        Final diagnoses:  Acute hypoxic respiratory failure [facility])  Hyponatremia    ED Discharge Orders     None          Herlinda Milling, PA-C 04/15/24 AMOS    Melvenia Motto, MD 04/19/24 2206

## 2024-04-16 LAB — BASIC METABOLIC PANEL WITH GFR
Anion gap: 9 (ref 5–15)
BUN: 38 mg/dL — ABNORMAL HIGH (ref 8–23)
CO2: 27 mmol/L (ref 22–32)
Calcium: 8.7 mg/dL — ABNORMAL LOW (ref 8.9–10.3)
Chloride: 89 mmol/L — ABNORMAL LOW (ref 98–111)
Creatinine, Ser: 2.19 mg/dL — ABNORMAL HIGH (ref 0.44–1.00)
GFR, Estimated: 22 mL/min — ABNORMAL LOW (ref >60)
Glucose, Bld: 106 mg/dL — ABNORMAL HIGH (ref 70–99)
Potassium: 5.2 mmol/L — ABNORMAL HIGH (ref 3.5–5.1)
Sodium: 125 mmol/L — ABNORMAL LOW (ref 135–145)

## 2024-04-16 LAB — CBC
HCT: 31.2 % — ABNORMAL LOW (ref 36.0–46.0)
Hemoglobin: 10.4 g/dL — ABNORMAL LOW (ref 12.0–15.0)
MCH: 31 pg (ref 26.0–34.0)
MCHC: 33.3 g/dL (ref 30.0–36.0)
MCV: 93.1 fL (ref 80.0–100.0)
Platelets: 179 K/uL (ref 150–400)
RBC: 3.35 MIL/uL — ABNORMAL LOW (ref 3.87–5.11)
RDW: 12.8 % (ref 11.5–15.5)
WBC: 12.1 K/uL — ABNORMAL HIGH (ref 4.0–10.5)
nRBC: 0 % (ref 0.0–0.2)

## 2024-04-16 MED ORDER — PANTOPRAZOLE SODIUM 40 MG PO TBEC
40.0000 mg | DELAYED_RELEASE_TABLET | Freq: Every day | ORAL | Status: DC
  Administered 2024-04-16 – 2024-04-21 (×6): 40 mg via ORAL
  Filled 2024-04-16 (×6): qty 1

## 2024-04-16 MED ORDER — SODIUM CHLORIDE 0.9 % IV BOLUS
1000.0000 mL | Freq: Once | INTRAVENOUS | Status: AC
  Administered 2024-04-16: 1000 mL via INTRAVENOUS

## 2024-04-16 MED ORDER — OXYCODONE HCL 5 MG PO TABS
5.0000 mg | ORAL_TABLET | Freq: Four times a day (QID) | ORAL | Status: DC | PRN
  Administered 2024-04-16 – 2024-04-21 (×5): 5 mg via ORAL
  Filled 2024-04-16 (×5): qty 1

## 2024-04-16 MED ORDER — HYDROCODONE-ACETAMINOPHEN 5-325 MG PO TABS
1.0000 | ORAL_TABLET | Freq: Four times a day (QID) | ORAL | Status: DC | PRN
  Administered 2024-04-17 – 2024-04-21 (×7): 1 via ORAL
  Filled 2024-04-16 (×7): qty 1

## 2024-04-16 MED ORDER — GABAPENTIN 300 MG PO CAPS
300.0000 mg | ORAL_CAPSULE | Freq: Two times a day (BID) | ORAL | Status: DC
  Administered 2024-04-16 – 2024-04-21 (×11): 300 mg via ORAL
  Filled 2024-04-16 (×11): qty 1

## 2024-04-16 MED ORDER — OXYCODONE HCL 5 MG PO TABS: 5.0000 mg | ORAL_TABLET | Freq: Three times a day (TID) | ORAL | Status: DC

## 2024-04-16 MED ORDER — EZETIMIBE 10 MG PO TABS
10.0000 mg | ORAL_TABLET | Freq: Every day | ORAL | Status: DC
  Administered 2024-04-16 – 2024-04-21 (×6): 10 mg via ORAL
  Filled 2024-04-16 (×6): qty 1

## 2024-04-16 NOTE — Progress Notes (Signed)
 PROGRESS NOTE    Kristen Barry  FMW:[id number]  DOB: [birth year]  DOA: 04/15/2024 PCP: Crecencio Chiquita POUR, FNP Outpatient Specialists:   Hospital course:  83 year old with HTN, HFpEF mild to moderate AS, H/oh CVA 2017, was admitted yesterday after a fall that occurred a week and a half ago with persistent right groin pain and feeling weak despite good oral intake.  Workup revealed AKI, hyponatremia and left lung opacities with small left pleural effusion, and mildly displaced fractures of the right inferior and superior pubic rami.  Patient was admitted for treatment of CAP and started on ceftriaxone and azithromycin    Subjective:  Patient states she feels much better than yesterday but still feeling weak and tired.  Seen with daughter who want to know why patient has not been restarted on her Lasix , losartan , chlorthalidone, Neurontin, Zetia, omeprazole.    Objective: Vitals:   04/15/24 1631 04/15/24 1855 04/15/24 1954 04/16/24 0439  BP:  (!) 97/43 (!) 103/52 (!) 108/55  Pulse:  64 63 70  Resp:    18  Temp: 98.2 F (36.8 C) 98.3 F (36.8 C) 98.8 F (37.1 C) 98.9 F (37.2 C)  TempSrc: Oral Oral Oral Oral  SpO2:  99% 91% 100%  Weight:      Height:        Intake/Output Summary (Last 24 hours) at 04/16/2024 0850 Last data filed at 04/15/2024 2200 Gross per 24 hour  Intake 991.67 ml  Output --  Net 991.67 ml   Filed Weights   04/15/24 1042  Weight: 97.3 kg     Exam:  General: Tired appearing patient looking younger than stated age lying in bed with attentive daughters at bedside Eyes: sclera anicteric, conjuctiva mild injection bilaterally CVS: S1-S2, regular  Respiratory:  decreased air entry bilaterally secondary to decreased inspiratory effort, rales at bases  GI: NABS, soft, NT  LE: Warm and well-perfused Neuro: A/O x 3,  grossly nonfocal.  Psych: patient is logical and coherent, judgement and insight appear normal, mood and affect appropriate to  situation.  Data Reviewed:  Basic Metabolic Panel: Recent Labs  Lab 04/15/24 1242 04/16/24 0442  NA 124* 125*  K 4.8 5.2*  CL 86* 89*  CO2 28 27  GLUCOSE 80 106*  BUN 32* 38*  CREATININE 1.81* 2.19*  CALCIUM  9.4 8.7*    CBC: Recent Labs  Lab 04/15/24 1242 04/16/24 0442  WBC 8.2 12.1*  NEUTROABS 6.4  --   HGB 11.7* 10.4*  HCT 35.2* 31.2*  MCV 93.1 93.1  PLT 178 179     Scheduled Meds:  clopidogrel   75 mg Oral Daily   heparin  5,000 Units Subcutaneous Q8H   Continuous Infusions:  sodium chloride  75 mL/hr at 04/15/24 2012   azithromycin Stopped (04/15/24 2310)   cefTRIAXone (ROCEPHIN)  IV Stopped (04/15/24 2047)     Assessment & Plan:  Acute hypoxic respiratory failure CAP O2 sats dropped down to the high 80s on room air with improvement on 2 L X-ray shows left midlung and basilar opacities Patient started on ceftriaxone and azithromycin COVID and influenza are negative  HTN Patient presently hypotensive likely secondary to intravascular volume depletion All antihypertensives are being held including losartan , amlodipine , Toprol-XL, Lasix  and chlorthalidone Anticipate improvement in blood pressure with hydration and treatment of infection Meds can be added back as warranted once blood pressure rebounds  AKI Hyponatremia Patient with rising creatinine overnight, 2.2 today up from 1.8 yesterday and up from 1.3 in May Most likely  prerenal causes secondary to infection and intravascular volume depletion with ongoing losartan , Lasix  and chlorthalidone.  Patient has also been on Celebrex. Losartan , Lasix , Celebrex and chlorthalidone are being held, will increase hydration and follow UA was bland on admission Echocardiogram from July 2025 shows normal LV function and diastolic parameters  Mild hyperkalemia Losartan  being held, patient being hydrated Recheck in the morning   Fall with mildly displaced pubic rami fractures Chronic pain EDP talked to  orthopedics, Dr. Onesimo regarding pubic rami fractures, these are non operable  Continue home doses of oxycodone 5 mg p.o. 3 times daily As needed hydrocodone for breakthrough pain Patient requesting resumption of her gabapentin, will start with lower doses given AKI   Anemia Slow progressive decrease in hemoglobin, 13 in May, 11.7 yesterday and 10.4 today with hydration Patient remains hemodynamically stable Will order stool guaiacs and anemia panel  Elevated BNP Diastolic dysfunction per cardiology notes No evidence for fluid overload, patient likely has decreased intravascular volume, hydrating as above Lasix  and chlorthalidone are being held Patient with apparent history of diastolic heart failure, she does have mild to moderate AS, although echocardiogram from July 2025 shows normal diastolic parameters and normal EF Elevated BNP may be secondary to AKI Presently likely hypovolemic, will hydrate and follow Lasix  and Chlorhalitone are being held  H/o CVA Continue Plavix      DVT prophylaxis: Heparin Code Status: FULL-confirmed with patient, and family at bedside. Family Communication: Daughters were at bedside throughout     Studies: DG Chest Portable 1 View Result Date: 04/15/2024 EXAM: 1 VIEW(S) XRAY OF THE CHEST 04/15/2024 03:05:03 PM COMPARISON: Comparison 10/12/2023. CLINICAL HISTORY: SOB. FINDINGS: LUNGS AND PLEURA: Left mid lung and basilar densities are noted concerning for atelectasis possibly pneumonia. Small left pleural effusion is noted. No pneumothorax. HEART AND MEDIASTINUM: Stable cardiomegaly. BONES AND SOFT TISSUES: No acute osseous abnormality. IMPRESSION: 1. Left mid lung and basilar opacities, concerning for atelectasis or pneumonia. 2. Small left pleural effusion. 3. Stable cardiomegaly. Electronically signed by: Lynwood Seip MD 04/15/2024 03:28 PM EST RP Workstation: [id number]   DG Lumbar Spine Complete Result Date: 04/15/2024 EXAM: 4 VIEW(S) XRAY OF THE  LUMBAR SPINE 04/15/2024 12:49:20 PM COMPARISON: Lumbar spine 06/08/2018. CLINICAL HISTORY: fall FINDINGS: LUMBAR SPINE: BONES: Chronic superior endplate compression fracture at L2. No acute fracture. No aggressive appearing osseous lesion. DISCS AND DEGENERATIVE CHANGES: Chronic anterolisthesis L4 on L5 by 10 mm. SOFT TISSUES: No acute abnormality. IMPRESSION: 1. No acute findings in the lumbar spine. Electronically signed by: Norleen Boxer MD 04/15/2024 01:36 PM EST RP Workstation: [id number]   DG Hip Unilat W or Wo Pelvis 2-3 Views Right Result Date: 04/15/2024 EXAM: 2 or 3 VIEW(S) XRAY OF THE RIGHT HIP 04/15/2024 12:49:20 PM COMPARISON: 05/20/2018 CLINICAL HISTORY: fall FINDINGS: BONES AND JOINTS: Mildly displaced fractures are seen involving the right inferior and superior pubic rami. The visualized proximal femur is unremarkable. The hip joint is maintained. No significant degenerative changes. SOFT TISSUES: The soft tissues are unremarkable. IMPRESSION: 1. Mildly displaced fractures of the right inferior and superior pubic rami. Electronically signed by: Lynwood Seip MD 04/15/2024 01:15 PM EST RP Workstation: [id number]    Principal Problem:   Acute hypoxemic respiratory failure (HCC) Active Problems:   PNA (pneumonia)   Fall at home, initial encounter   AKI (acute kidney injury)   Essential hypertension   Obesity     Dajanae Brophy Vangie Pike, [facility]  If 7PM-7AM, please contact night-coverage [email or web address]   LOS: 1 day

## 2024-04-16 NOTE — Progress Notes (Signed)
   04/16/24 1109  TOC Brief Assessment  Insurance and Status Reviewed  Patient has primary care physician Yes  Home environment has been reviewed From home  Prior level of function: Independent  Prior/Current Home Services No current home services  Social Drivers of Health Review SDOH reviewed no interventions necessary  Readmission risk has been reviewed Yes  Transition of care needs transition of care needs identified, TOC will continue to follow    Transition of Care Department (TOC) has reviewed patient and no TOC needs have been identified at this time. We will continue to monitor patient advancement through interdisciplinary progression rounds. If new patient transition needs arise, please place a TOC consult.

## 2024-04-17 LAB — BASIC METABOLIC PANEL WITH GFR
Anion gap: 7 (ref 5–15)
BUN: 32 mg/dL — ABNORMAL HIGH (ref 8–23)
CO2: 28 mmol/L (ref 22–32)
Calcium: 9.1 mg/dL (ref 8.9–10.3)
Chloride: 94 mmol/L — ABNORMAL LOW (ref 98–111)
Creatinine, Ser: 1.63 mg/dL — ABNORMAL HIGH (ref 0.44–1.00)
GFR, Estimated: 31 mL/min — ABNORMAL LOW (ref >60)
Glucose, Bld: 96 mg/dL (ref 70–99)
Potassium: 5.3 mmol/L — ABNORMAL HIGH (ref 3.5–5.1)
Sodium: 129 mmol/L — ABNORMAL LOW (ref 135–145)

## 2024-04-17 LAB — CBC
HCT: 33.6 % — ABNORMAL LOW (ref 36.0–46.0)
Hemoglobin: 11 g/dL — ABNORMAL LOW (ref 12.0–15.0)
MCH: 31.2 pg (ref 26.0–34.0)
MCHC: 32.7 g/dL (ref 30.0–36.0)
MCV: 95.2 fL (ref 80.0–100.0)
Platelets: 181 K/uL (ref 150–400)
RBC: 3.53 MIL/uL — ABNORMAL LOW (ref 3.87–5.11)
RDW: 13.1 % (ref 11.5–15.5)
WBC: 6.7 K/uL (ref 4.0–10.5)
nRBC: 0 % (ref 0.0–0.2)

## 2024-04-17 LAB — FERRITIN: Ferritin: 352 ng/mL — ABNORMAL HIGH (ref 11–307)

## 2024-04-17 LAB — IRON AND TIBC
Iron: 32 ug/dL (ref 28–170)
Saturation Ratios: 15 % (ref 10.4–31.8)
TIBC: 207 ug/dL — ABNORMAL LOW (ref 250–450)
UIBC: 175 ug/dL

## 2024-04-17 LAB — RETICULOCYTES
Immature Retic Fract: 20.8 % — ABNORMAL HIGH (ref 2.3–15.9)
RBC.: 3.56 MIL/uL — ABNORMAL LOW (ref 3.87–5.11)
Retic Count, Absolute: 86.9 K/uL (ref 19.0–186.0)
Retic Ct Pct: 2.4 % (ref 0.4–3.1)

## 2024-04-17 LAB — VITAMIN B12: Vitamin B-12: 2352 pg/mL — ABNORMAL HIGH (ref 180–914)

## 2024-04-17 LAB — FOLATE: Folate: 11.5 ng/mL (ref >5.9)

## 2024-04-17 MED ORDER — INFLUENZA VAC SPLIT HIGH-DOSE 0.5 ML IM SUSY: 0.5000 mL | PREFILLED_SYRINGE | INTRAMUSCULAR | Status: DC

## 2024-04-17 MED ORDER — ACETAMINOPHEN 500 MG PO TABS
1000.0000 mg | ORAL_TABLET | Freq: Three times a day (TID) | ORAL | Status: DC
  Administered 2024-04-17 – 2024-04-21 (×13): 1000 mg via ORAL
  Filled 2024-04-17 (×13): qty 2

## 2024-04-17 MED ORDER — SODIUM ZIRCONIUM CYCLOSILICATE 5 G PO PACK
5.0000 g | PACK | Freq: Once | ORAL | Status: AC
  Administered 2024-04-17: 5 g via ORAL
  Filled 2024-04-17: qty 1

## 2024-04-17 MED ORDER — SALINE SPRAY 0.65 % NA SOLN
1.0000 | NASAL | Status: DC | PRN
  Administered 2024-04-18: 1 via NASAL
  Filled 2024-04-17: qty 44

## 2024-04-17 MED ORDER — CYCLOBENZAPRINE HCL 10 MG PO TABS: 5.0000 mg | ORAL_TABLET | Freq: Three times a day (TID) | ORAL | Status: DC | PRN

## 2024-04-17 MED ORDER — MECLIZINE HCL 12.5 MG PO TABS: 25.0000 mg | ORAL_TABLET | ORAL | Status: DC | PRN

## 2024-04-17 MED ORDER — MECLIZINE HCL 12.5 MG PO TABS: 25.0000 mg | ORAL_TABLET | Freq: Three times a day (TID) | ORAL | Status: DC | PRN

## 2024-04-17 NOTE — Plan of Care (Signed)
  Problem: Acute Rehab PT Goals(only PT should resolve) Goal: Pt Will Go Supine/Side To Sit Outcome: Progressing Flowsheets (Taken 04/17/2024 1348) Pt will go Supine/Side to Sit: with minimal assist Goal: Pt Will Go Sit To Supine/Side Outcome: Progressing Flowsheets (Taken 04/17/2024 1348) Pt will go Sit to Supine/Side: with minimal assist Goal: Patient Will Perform Sitting Balance Outcome: Progressing Flowsheets (Taken 04/17/2024 1348) Patient will perform sitting balance:  with minimal assist  with contact guard assist Goal: Patient Will Transfer Sit To/From Stand Outcome: Progressing Flowsheets (Taken 04/17/2024 1348) Patient will transfer sit to/from stand:  with contact guard assist  with supervision Goal: Pt Will Transfer Bed To Chair/Chair To Bed Outcome: Progressing Flowsheets (Taken 04/17/2024 1348) Pt will Transfer Bed to Chair/Chair to Bed:  with contact guard assist  with min assist Goal: Pt Will Perform Standing Balance Or Pre-Gait Outcome: Progressing Flowsheets (Taken 04/17/2024 1348) Pt will perform standing balance or pre-gait:  with contact guard assist  with Supervision  with bilateral UE support Note: W/ RW  Goal: Pt Will Ambulate Outcome: Progressing Flowsheets (Taken 04/17/2024 1348) Pt will Ambulate:  50 feet  25 feet  with supervision  with contact guard assist  with rolling walker   Kristen Barry, SPT

## 2024-04-17 NOTE — TOC Progression Note (Addendum)
 Transition of Care [facility]) - Progression Note    Patient Details  Name: Kristen Barry MRN: [id number] Date of Birth: [birth year]  [facility]) CM/SW Contact  Hoy DELENA Bigness, LCSW Phone Number: 04/17/2024, 1:10 PM  Clinical Narrative:    Pt/family agreeable to recommendation for SNF placement at discharge. Pt would like placement at [facility] or [facility] if they have availability. Referral has been sent and currently awaiting bed offers.   Update 3:15pm: Met with pt to review bed offer. Pt accepted SNF placement at [facility].   [facility] [address] [place], [place] [address] [phone] Overall rating ??   Expected Discharge Plan: Skilled Nursing Facility Barriers to Discharge: Continued Medical Work up               Expected Discharge Plan and Services In-house Referral: Clinical Social Work Discharge Planning Services: NA Post Acute Care Choice: Skilled Nursing Facility Living arrangements for the past 2 months: Single Family Home                 DME Arranged: N/A DME Agency: NA                   Social Drivers of Health (SDOH) Interventions SDOH Screenings   Food Insecurity: No Food Insecurity (04/15/2024)  Housing: Low Risk  (04/15/2024)  Transportation Needs: No Transportation Needs (04/15/2024)  Utilities: Not At Risk (04/15/2024)  Social Connections: Moderately Integrated (04/15/2024)  Tobacco Use: Medium Risk (04/15/2024)    Readmission Risk Interventions    04/16/2024   11:09 AM 04/16/2024    9:31 AM  Readmission Risk Prevention Plan  Post Dischage Appt Complete Complete  Medication Screening Complete Complete  Transportation Screening Complete Complete

## 2024-04-17 NOTE — Progress Notes (Signed)
 PROGRESS NOTE    Kristen Barry  FMW:[id number]  DOB: [birth year]  DOA: 04/15/2024 PCP: Crecencio Chiquita POUR, FNP Outpatient Specialists:   Hospital course:  83 year old with HTN, HFpEF mild to moderate AS, H/oh CVA 2017, was admitted yesterday after a fall that occurred a week and a half ago with persistent right groin pain and feeling weak despite good oral intake.  Workup revealed AKI, hyponatremia and left lung opacities with small left pleural effusion, and mildly displaced fractures of the right inferior and superior pubic rami.  Patient was admitted for treatment of CAP and started on ceftriaxone  and azithromycin     Subjective:  Patient states she feels much better than yesterday but still feeling weak and tired.  Seen with daughter who want to know why patient has not been restarted on her Lasix , losartan , chlorthalidone, Neurontin , Zetia , omeprazole.    Objective: Vitals:   04/16/24 1925 04/17/24 0352 04/17/24 0835 04/17/24 0900  BP: (!) 109/52 (!) 119/51 132/64 132/64  Pulse: 65 77 88 90  Resp: 18 17  18   Temp: 98.3 F (36.8 C) 98.7 F (37.1 C) 98.7 F (37.1 C) 97.7 F (36.5 C)  TempSrc: Oral Oral Oral   SpO2: 99% 99% 99% 99%  Weight:      Height:        Intake/Output Summary (Last 24 hours) at 04/17/2024 1108 Last data filed at 04/17/2024 0230 Gross per 24 hour  Intake 940 ml  Output --  Net 940 ml   Filed Weights   04/15/24 1042  Weight: 97.3 kg     Exam:   General: Appearance:    Obese female in no acute distress     Lungs:     Diminished, respirations unlabored  Heart:    Normal heart rate. + murmur   MS:   All extremities are intact.   Neurologic:   Awake, alert     Data Reviewed:  Basic Metabolic Panel: Recent Labs  Lab 04/15/24 1242 04/16/24 0442 04/17/24 0443  NA 124* 125* 129*  K 4.8 5.2* 5.3*  CL 86* 89* 94*  CO2 28 27 28   GLUCOSE 80 106* 96  BUN 32* 38* 32*  CREATININE 1.81* 2.19* 1.63*  CALCIUM  9.4 8.7* 9.1     CBC: Recent Labs  Lab 04/15/24 1242 04/16/24 0442 04/17/24 0443  WBC 8.2 12.1* 6.7  NEUTROABS 6.4  --   --   HGB 11.7* 10.4* 11.0*  HCT 35.2* 31.2* 33.6*  MCV 93.1 93.1 95.2  PLT 178 179 181     Scheduled Meds:  clopidogrel   75 mg Oral Daily   ezetimibe   10 mg Oral Daily   gabapentin   300 mg Oral BID   heparin   5,000 Units Subcutaneous Q8H   pantoprazole   40 mg Oral Daily   Continuous Infusions:  azithromycin  500 mg (04/16/24 2013)   cefTRIAXone  (ROCEPHIN )  IV 2 g (04/16/24 1935)     Assessment & Plan:  Acute hypoxic respiratory failure CAP O2 sats dropped down to the high 80s on room air with improvement on 2 L- wean to RA as able as she does not wear at home IS X-ray shows left midlung and basilar opacities Patient started on ceftriaxone  and azithromycin  COVID and influenza are negative  HTN Patient presently hypotensive likely secondary to intravascular volume depletion All antihypertensives are being held including losartan , amlodipine , Toprol -XL, Lasix  and chlorthalidone -resume as able  AKI Hyponatremia -CR improved although not back to baseline -Na trending up -will hold on  any further IVF for now and recheck labs in the AM  Mild hyperkalemia Losartan  being held, patient being hydrated -lokelma  x 1 -recheck in the AM  Fall with mildly displaced pubic rami fractures Chronic pain EDP talked to orthopedics, Dr. Onesimo regarding pubic rami fractures, these are non operable  Continue home doses of oxycodone  5 mg p.o. 3 times daily Patient requesting resumption of her gabapentin , will start with lower doses given AKI   Anemia Slow progressive decrease in hemoglobin, 13 in May, 11.7 yesterday and 10.4 today with hydration -trend  Elevated BNP Diastolic dysfunction per cardiology notes Lasix  and chlorthalidone are being held -apparent history of diastolic heart failure, she does have mild to moderate AS, although echocardiogram from July 2025  shows normal diastolic parameters and normal EF Elevated BNP may be secondary to AKI Lasix  and Chlorhalitone are being held-- will also hold on any further IVF  H/o CVA Continue Plavix      DVT prophylaxis: Heparin  Code Status: FULL-confirmed with patient, and family at bedside. Family Communication: Daughter at bedside throughout     Studies: DG Chest Portable 1 View Result Date: 04/15/2024 EXAM: 1 VIEW(S) XRAY OF THE CHEST 04/15/2024 03:05:03 PM COMPARISON: Comparison 10/12/2023. CLINICAL HISTORY: SOB. FINDINGS: LUNGS AND PLEURA: Left mid lung and basilar densities are noted concerning for atelectasis possibly pneumonia. Small left pleural effusion is noted. No pneumothorax. HEART AND MEDIASTINUM: Stable cardiomegaly. BONES AND SOFT TISSUES: No acute osseous abnormality. IMPRESSION: 1. Left mid lung and basilar opacities, concerning for atelectasis or pneumonia. 2. Small left pleural effusion. 3. Stable cardiomegaly. Electronically signed by: Lynwood Seip MD 04/15/2024 03:28 PM EST RP Workstation: [id number]   DG Lumbar Spine Complete Result Date: 04/15/2024 EXAM: 4 VIEW(S) XRAY OF THE LUMBAR SPINE 04/15/2024 12:49:20 PM COMPARISON: Lumbar spine 06/08/2018. CLINICAL HISTORY: fall FINDINGS: LUMBAR SPINE: BONES: Chronic superior endplate compression fracture at L2. No acute fracture. No aggressive appearing osseous lesion. DISCS AND DEGENERATIVE CHANGES: Chronic anterolisthesis L4 on L5 by 10 mm. SOFT TISSUES: No acute abnormality. IMPRESSION: 1. No acute findings in the lumbar spine. Electronically signed by: Norleen Boxer MD 04/15/2024 01:36 PM EST RP Workstation: [id number]   DG Hip Unilat W or Wo Pelvis 2-3 Views Right Result Date: 04/15/2024 EXAM: 2 or 3 VIEW(S) XRAY OF THE RIGHT HIP 04/15/2024 12:49:20 PM COMPARISON: 05/20/2018 CLINICAL HISTORY: fall FINDINGS: BONES AND JOINTS: Mildly displaced fractures are seen involving the right inferior and superior pubic rami. The visualized  proximal femur is unremarkable. The hip joint is maintained. No significant degenerative changes. SOFT TISSUES: The soft tissues are unremarkable. IMPRESSION: 1. Mildly displaced fractures of the right inferior and superior pubic rami. Electronically signed by: Lynwood Seip MD 04/15/2024 01:15 PM EST RP Workstation: [id number]       Harlene RAYMOND Bowl [facility]  If 7PM-7AM, please contact night-coverage [email or web address]   LOS: 2 days

## 2024-04-17 NOTE — NC FL2 (Signed)
 [place]  MEDICAID FL2 LEVEL OF CARE FORM     IDENTIFICATION  Patient Name: Kristen Barry Birthdate: [birth year] Sex: female Admission Date (Current Location): 04/15/2024  [facility] and [place] Number:  [employer] and Address:  [facility],  [address], [place] [address]      Provider Number: [id number]  Attending Physician Name and Address:  Juvenal Harlene PENNER, DO  Relative Name and Phone Number:  Ann Hoose (Daughter)  [phone]    Current Level of Care: Hospital Recommended Level of Care: Skilled Nursing Facility Prior Approval Number:    Date Approved/Denied:   PASRR Number: [id number]  Discharge Plan: SNF    Current Diagnoses: Patient Active Problem List   Diagnosis Date Noted   Acute hypoxemic respiratory failure (HCC) 04/15/2024   PNA (pneumonia) 04/15/2024   Fall at home, initial encounter 04/15/2024   AKI (acute kidney injury) 04/15/2024   DOE (dyspnea on exertion) 07/23/2017   Hyperlipidemia 02/01/2017   Abnormality of gait 04/24/2016   Acute CVA (cerebrovascular accident) [facility])    Right sided weakness 02/25/2016   Slurred speech 02/25/2016   Cerebral infarction (HCC) 02/25/2016   Obesity 02/25/2016   Essential hypertension 07/10/2013    Orientation RESPIRATION BLADDER Height & Weight     Self, Time, Situation, Place  Normal Continent Weight: 214 lb 8.1 oz (97.3 kg) Height:  5' 2 (157.5 cm)  BEHAVIORAL SYMPTOMS/MOOD NEUROLOGICAL BOWEL NUTRITION STATUS      Continent Diet (See DC summary)  AMBULATORY STATUS COMMUNICATION OF NEEDS Skin   Extensive Assist Verbally Normal                       Personal Care Assistance Level of Assistance  Bathing, Feeding, Dressing Bathing Assistance: Maximum assistance Feeding assistance: Independent Dressing Assistance: Maximum assistance     Functional Limitations Info  Sight, Hearing, Speech Sight Info: Impaired (eyeglasses) Hearing Info: Adequate Speech Info:  Adequate    SPECIAL CARE FACTORS FREQUENCY  PT (By licensed PT), OT (By licensed OT)     PT Frequency: 5x/wk OT Frequency: 5x/wk            Contractures Contractures Info: Not present    Additional Factors Info  Code Status, Allergies Code Status Info: FULL Allergies Info: Ace Inhibitors, Codeine           Current Medications (04/17/2024):  This is the current hospital active medication list Current Facility-Administered Medications  Medication Dose Route Frequency Provider Last Rate Last Admin   acetaminophen  (TYLENOL ) tablet 1,000 mg  1,000 mg Oral TID Vann, Jessica U, DO   1,000 mg at 04/17/24 1203   albuterol  (PROVENTIL ) (2.5 MG/3ML) 0.083% nebulizer solution 2.5 mg  2.5 mg Nebulization Q4H PRN Emokpae, Ejiroghene E, MD   2.5 mg at 04/16/24 1201   azithromycin  (ZITHROMAX ) 500 mg in sodium chloride  0.9 % 250 mL IVPB  500 mg Intravenous Q24H Emokpae, Ejiroghene E, MD 250 mL/hr at 04/16/24 2013 500 mg at 04/16/24 2013   cefTRIAXone  (ROCEPHIN ) 2 g in sodium chloride  0.9 % 100 mL IVPB  2 g Intravenous Q24H Emokpae, Ejiroghene E, MD 200 mL/hr at 04/16/24 1935 2 g at 04/16/24 1935   clopidogrel  (PLAVIX ) tablet 75 mg  75 mg Oral Daily Emokpae, Ejiroghene E, MD   75 mg at 04/17/24 0843   cyclobenzaprine  (FLEXERIL ) tablet 5 mg  5 mg Oral TID PRN Vann, Jessica U, DO       ezetimibe  (ZETIA ) tablet 10 mg  10  mg Oral Daily Chatterjee, Srobona Tublu, MD   10 mg at 04/17/24 9156   gabapentin  (NEURONTIN ) capsule 300 mg  300 mg Oral BID Chatterjee, Srobona Tublu, MD   300 mg at 04/17/24 9157   guaiFENesin -dextromethorphan (ROBITUSSIN DM) 100-10 MG/5ML syrup 15 mL  15 mL Oral Q8H PRN Emokpae, Ejiroghene E, MD       heparin  injection 5,000 Units  5,000 Units Subcutaneous Q8H Emokpae, Ejiroghene E, MD   5,000 Units at 04/17/24 0554   HYDROcodone -acetaminophen  (NORCO/VICODIN) 5-325 MG per tablet 1 tablet  1 tablet Oral Q6H PRN Chatterjee, Srobona Tublu, MD   1 tablet at 04/17/24 0853   meclizine   (ANTIVERT ) tablet 25 mg  25 mg Oral TID PRN Vann, Jessica U, DO       ondansetron  (ZOFRAN ) tablet 4 mg  4 mg Oral Q6H PRN Emokpae, Ejiroghene E, MD       Or   ondansetron  (ZOFRAN ) injection 4 mg  4 mg Intravenous Q6H PRN Emokpae, Ejiroghene E, MD       oxyCODONE  (Oxy IR/ROXICODONE ) immediate release tablet 5 mg  5 mg Oral Q6H PRN Chatterjee, Srobona Tublu, MD   5 mg at 04/16/24 1709   pantoprazole  (PROTONIX ) EC tablet 40 mg  40 mg Oral Daily Chatterjee, Srobona Tublu, MD   40 mg at 04/17/24 0843   polyethylene glycol (MIRALAX  / GLYCOLAX ) packet 17 g  17 g Oral Daily PRN Emokpae, Ejiroghene E, MD         Discharge Medications: Please see discharge summary for a list of discharge medications.  Relevant Imaging Results:  Relevant Lab Results:   Additional Information SSN: [id number]  Hoy DELENA Bigness, LCSW

## 2024-04-17 NOTE — Plan of Care (Signed)
  Problem: Education: Goal: Knowledge of General Education information will improve Description: Including pain rating scale, medication(s)/side effects and non-pharmacologic comfort measures Outcome: Progressing   Problem: Health Behavior/Discharge Planning: Goal: Ability to manage health-related needs will improve Outcome: Progressing   Problem: Clinical Measurements: Goal: Ability to maintain clinical measurements within normal limits will improve Outcome: Progressing Goal: Respiratory complications will improve Outcome: Progressing   Problem: Activity: Goal: Risk for activity intolerance will decrease Outcome: Progressing   Problem: Nutrition: Goal: Adequate nutrition will be maintained Outcome: Progressing   Problem: Pain Managment: Goal: General experience of comfort will improve and/or be controlled Outcome: Progressing   Problem: Safety: Goal: Ability to remain free from injury will improve Outcome: Progressing   Problem: Skin Integrity: Goal: Risk for impaired skin integrity will decrease Outcome: Progressing   Problem: Activity: Goal: Ability to tolerate increased activity will improve Outcome: Progressing   Problem: Respiratory: Goal: Ability to maintain adequate ventilation will improve Outcome: Progressing

## 2024-04-17 NOTE — Progress Notes (Signed)
 Potassium 5.3 this morning,Dr Harlene Bowl notified. Plan of care on going.

## 2024-04-17 NOTE — Evaluation (Signed)
 Physical Therapy Evaluation Patient Details Name: MARGARITA CROKE MRN: [id number] DOB: 1940-12-05 Today's Date: 04/17/2024  History of Present Illness  KERRILYN AZBILL is a 83 y.o. female with medical history significant for CVA, hypertension, CHF, peripheral artery disease, stroke.  Patient presented to the ED with reports of a fall that occurred about a week and a half ago.  Distention has had persistent pain to her right groin, today she slid out of bed onto the floor, she did not fall.   She reports wheezing over the past 4 days, with dry cough that was noticed today.  No difficulty breathing.  No lower extremity swelling.  No dizziness.  No abdominal bloating.  She reports good oral intake, no vomiting no diarrhea.  Has been compliant with Lasix  20 mg daily, but family has also ensure that she stays hydrated.    Clinical Impression  Pt. Presented general weakness and chief complain of R. Hip/Groin pain. Pt tolerated session well, on room air, and able to maintain >90% of SpO2 during functional tasks within session. Pt was bale to perform bed mobility w/Mod A to due pain in RLE. Pt. Was able to transfer from bed to chair w/ RW and Min A/ Mod A. Pt was able to ambulate from chair to door way w/ RW and Min A/ CGA. Pt was left in chair w/ call bell and SpO2 remaining 95% after treatment, granddaughter was at bedside. Nursing staff was notified on pt status.  Patient will benefit from continued skilled physical therapy in hospital and recommended venue below to increase strength, balance, endurance for safe ADLs and gait.       If plan is discharge home, recommend the following: A little help with walking and/or transfers;A little help with bathing/dressing/bathroom;Assist for transportation;Assistance with cooking/housework;Help with stairs or ramp for entrance   Can travel by private vehicle   Yes    Equipment Recommendations    Recommendations for Other Services       Functional Status  Assessment Patient has had a recent decline in their functional status and demonstrates the ability to make significant improvements in function in a reasonable and predictable amount of time.     Precautions / Restrictions Precautions Precautions: Fall Recall of Precautions/Restrictions: Intact Restrictions Weight Bearing Restrictions Per Provider Order: No      Mobility  Bed Mobility Overal bed mobility: Needs Assistance Bed Mobility: Supine to Sit     Supine to sit: Mod assist     General bed mobility comments: LE to EOB, due to pain in RLE    Transfers Overall transfer level: Needs assistance Equipment used: Rolling walker (2 wheels) Transfers: Sit to/from Stand, Bed to chair/wheelchair/BSC Sit to Stand: Min assist, Mod assist   Step pivot transfers: Min assist, Mod assist       General transfer comment: using RW    Ambulation/Gait Ambulation/Gait assistance: Contact guard assist, Min assist Gait Distance (Feet): 19 Feet Assistive device: Rolling walker (2 wheels) Gait Pattern/deviations: Step-through pattern, Decreased step length - right, Decreased step length - left, Decreased stance time - right, Decreased stance time - left, Decreased stride length, Narrow base of support, Antalgic       General Gait Details: Pt. ambulated w/ RW to doorway and back to chair, Pt had slight pain w/ ambulation, but able to tolerated WB.  Stairs            Wheelchair Mobility     Tilt Bed    Modified Rankin (Stroke Patients  Only)       Balance Overall balance assessment: Needs assistance Sitting-balance support: Bilateral upper extremity supported, Feet supported, Feet unsupported Sitting balance-Leahy Scale: Fair Sitting balance - Comments: to EOB   Standing balance support: During functional activity, Reliant on assistive device for balance, Bilateral upper extremity supported Standing balance-Leahy Scale: Fair Standing balance comment: Fair/poor w/ RW                              Pertinent Vitals/Pain Pain Assessment Pain Assessment: 0-10 Pain Score: 7  Pain Location: R. Hip/Groin pain Pain Descriptors / Indicators: Aching, Discomfort, Burning Pain Intervention(s): Limited activity within patient's tolerance, Premedicated before session, Repositioned    Home Living Family/patient expects to be discharged to:: Private residence Living Arrangements: Spouse/significant other Available Help at Discharge: Family;Available PRN/intermittently Type of Home: House Home Access: Ramped entrance Entrance Stairs-Rails: Can reach both     Home Layout: One level Home Equipment: Grab bars - tub/shower;Rolling Walker (2 wheels);Rollator (4 wheels);Cane - quad;Cane - single point;BSC/3in1 Additional Comments: Granddaughter mentioned pt will be getting a shower chair    Prior Function Prior Level of Function : Independent/Modified Independent;Driving;Working/employed             Mobility Comments: Pt. was able to be independent before stroke and hip/groin pain, community ambulation w/ rollator ADLs Comments: [facility]     Extremity/Trunk Assessment        Lower Extremity Assessment Lower Extremity Assessment: Generalized weakness    Cervical / Trunk Assessment Cervical / Trunk Assessment: Normal  Communication   Communication Communication: No apparent difficulties    Cognition Arousal: Alert Behavior During Therapy: WFL for tasks assessed/performed   PT - Cognitive impairments: No apparent impairments                         Following commands: Intact       Cueing Cueing Techniques: Verbal cues, Tactile cues     General Comments General comments (skin integrity, edema, etc.): removed 2L [place], Pt was room air, In Supine SpO2 100%, in sitting at EOB SpO2 97%, Standing w/ RW SpO2 95%, sitting back in chair after ambulation SpO2 95%    Exercises     Assessment/Plan    PT Assessment Patient needs  continued PT services  PT Problem List Decreased strength;Decreased range of motion;Decreased activity tolerance;Decreased safety awareness;Decreased balance;Decreased mobility;Decreased coordination       PT Treatment Interventions DME instruction;Gait training;Stair training;Patient/family education;Functional mobility training;Therapeutic activities;Therapeutic exercise;Balance training    PT Goals (Current goals can be found in the Care Plan section)  Acute Rehab PT Goals Patient Stated Goal: wants to go to rehab to regain strength PT Goal Formulation: With patient/family Time For Goal Achievement: 04/24/24 Potential to Achieve Goals: Good    Frequency Min 3X/week     Co-evaluation               AM-PAC PT 6 Clicks Mobility  Outcome Measure Help needed turning from your back to your side while in a flat bed without using bedrails?: A Lot Help needed moving from lying on your back to sitting on the side of a flat bed without using bedrails?: A Lot Help needed moving to and from a bed to a chair (including a wheelchair)?: A Little Help needed standing up from a chair using your arms (e.g., wheelchair or bedside chair)?: A Little Help needed to walk in hospital room?:  A Little Help needed climbing 3-5 steps with a railing? : A Lot 6 Click Score: 15    End of Session Equipment Utilized During Treatment: Gait belt Activity Tolerance: Patient tolerated treatment well;Patient limited by fatigue;Patient limited by pain Patient left: in chair;with family/visitor present;with call bell/phone within reach Nurse Communication: Mobility status PT Visit Diagnosis: Other abnormalities of gait and mobility (R26.89);Muscle weakness (generalized) (M62.81);History of falling (Z91.81)    Time: 8946-8875 PT Time Calculation (min) (ACUTE ONLY): 31 min   Charges:     PT Treatments $Therapeutic Activity: 23-37 mins PT General Charges $$ ACUTE PT VISIT: 1 Visit        Karelly Dewalt, SPT

## 2024-04-18 DIAGNOSIS — E6609 Other obesity due to excess calories: Secondary | ICD-10-CM

## 2024-04-18 DIAGNOSIS — Z6834 Body mass index (BMI) 34.0-34.9, adult: Secondary | ICD-10-CM

## 2024-04-18 DIAGNOSIS — E875 Hyperkalemia: Secondary | ICD-10-CM

## 2024-04-18 DIAGNOSIS — E871 Hypo-osmolality and hyponatremia: Secondary | ICD-10-CM

## 2024-04-18 DIAGNOSIS — E66811 Obesity, class 1: Secondary | ICD-10-CM

## 2024-04-18 LAB — CBC
HCT: 34.2 % — ABNORMAL LOW (ref 36.0–46.0)
Hemoglobin: 11.3 g/dL — ABNORMAL LOW (ref 12.0–15.0)
MCH: 31 pg (ref 26.0–34.0)
MCHC: 33 g/dL (ref 30.0–36.0)
MCV: 93.7 fL (ref 80.0–100.0)
Platelets: 231 10*3/uL (ref 150–400)
RBC: 3.65 MIL/uL — ABNORMAL LOW (ref 3.87–5.11)
RDW: 13.1 % (ref 11.5–15.5)
WBC: 6.7 10*3/uL (ref 4.0–10.5)
nRBC: 0 % (ref 0.0–0.2)

## 2024-04-18 LAB — BASIC METABOLIC PANEL WITH GFR
Anion gap: 9 (ref 5–15)
BUN: 28 mg/dL — ABNORMAL HIGH (ref 8–23)
CO2: 27 mmol/L (ref 22–32)
Calcium: 9.7 mg/dL (ref 8.9–10.3)
Chloride: 92 mmol/L — ABNORMAL LOW (ref 98–111)
Creatinine, Ser: 1.33 mg/dL — ABNORMAL HIGH (ref 0.44–1.00)
GFR, Estimated: 40 mL/min — ABNORMAL LOW
Glucose, Bld: 91 mg/dL (ref 70–99)
Potassium: 5.3 mmol/L — ABNORMAL HIGH (ref 3.5–5.1)
Sodium: 128 mmol/L — ABNORMAL LOW (ref 135–145)

## 2024-04-18 MED ORDER — VITAMIN D 25 MCG (1000 UNIT) PO TABS
1000.0000 [IU] | ORAL_TABLET | Freq: Every day | ORAL | Status: DC
  Administered 2024-04-18 – 2024-04-21 (×4): 1000 [IU] via ORAL
  Filled 2024-04-18 (×4): qty 1

## 2024-04-18 MED ORDER — METOPROLOL SUCCINATE ER 50 MG PO TB24
100.0000 mg | ORAL_TABLET | Freq: Every day | ORAL | Status: DC
  Administered 2024-04-18 – 2024-04-21 (×4): 100 mg via ORAL
  Filled 2024-04-18 (×4): qty 2

## 2024-04-18 MED ORDER — AZITHROMYCIN 250 MG PO TABS
500.0000 mg | ORAL_TABLET | Freq: Every day | ORAL | Status: AC
  Administered 2024-04-18 – 2024-04-19 (×2): 500 mg via ORAL
  Filled 2024-04-18 (×2): qty 2

## 2024-04-18 MED ORDER — SODIUM ZIRCONIUM CYCLOSILICATE 10 G PO PACK
10.0000 g | PACK | Freq: Once | ORAL | Status: AC
  Administered 2024-04-18: 10 g via ORAL
  Filled 2024-04-18: qty 1

## 2024-04-18 MED ORDER — AMLODIPINE BESYLATE 5 MG PO TABS
5.0000 mg | ORAL_TABLET | Freq: Every day | ORAL | Status: DC
  Administered 2024-04-18 – 2024-04-20 (×3): 5 mg via ORAL
  Filled 2024-04-18 (×3): qty 1

## 2024-04-18 MED ORDER — MAGNESIUM OXIDE -MG SUPPLEMENT 400 (240 MG) MG PO TABS
400.0000 mg | ORAL_TABLET | Freq: Every day | ORAL | Status: DC
  Administered 2024-04-18 – 2024-04-21 (×4): 400 mg via ORAL
  Filled 2024-04-18 (×4): qty 1

## 2024-04-18 MED ORDER — MAGNESIUM GLUCONATE 500 (27 MG) MG PO TABS: 500.0000 mg | ORAL_TABLET | Freq: Every day | ORAL | Status: DC

## 2024-04-18 MED ORDER — ORAL CARE MOUTH RINSE: 15.0000 mL | OROMUCOSAL | Status: DC | PRN

## 2024-04-18 NOTE — Progress Notes (Signed)
 PHARMACIST - PHYSICIAN COMMUNICATION DR:   TRH CONCERNING: Antibiotic IV to Oral Route Change Policy  RECOMMENDATION: This patient is receiving azithromycin  by the intravenous route.  Based on criteria approved by the Pharmacy and Therapeutics Committee, the antibiotic(s) is/are being converted to the equivalent oral dose form(s).   DESCRIPTION: These criteria include: Patient being treated for a respiratory tract infection, urinary tract infection, cellulitis or clostridium difficile associated diarrhea if on metronidazole The patient is not neutropenic and does not exhibit a GI malabsorption state The patient is eating (either orally or via tube) and/or has been taking other orally administered medications for a least 24 hours The patient is improving clinically and has a Tmax < 100.5  If you have questions about this conversion, please contact the Pharmacy Department  [x]   [phone] )  [facility] []   [phone] )  [facility] []   [phone] )  [facility] []   [phone] )  [facility] []   [phone] )  [facility]   Celestine Slovak, PharmD, [place], [place] Work Cell: [phone] 04/18/2024 8:36 AM

## 2024-04-18 NOTE — Plan of Care (Signed)

## 2024-04-18 NOTE — Progress Notes (Signed)
 Patient's potasium was 5.3 this morning,Dr Sumayya Amin notified.Plan of care on going.

## 2024-04-18 NOTE — Progress Notes (Signed)
 PROGRESS NOTE    Kristen Barry  FMW:[id number]  DOB: 1940/09/20  DOA: 04/15/2024 PCP: Crecencio Chiquita POUR, FNP Outpatient Specialists:   Hospital course:  83 year old with HTN, HFpEF mild to moderate AS, H/oh CVA 2017, was admitted yesterday after a fall that occurred a week and a half ago with persistent right groin pain and feeling weak despite good oral intake.  Workup revealed AKI, hyponatremia and left lung opacities with small left pleural effusion, and mildly displaced fractures of the right inferior and superior pubic rami.  Patient was admitted for treatment of CAP and started on ceftriaxone  and azithromycin   11/21: Hemodynamically stable, persistent mild hyponatremia, chlorthalidone should be discontinued.  Mild hyperkalemia with potassium at 5.3-giving some Lokelma , improving renal function. Had a bed offer-pending insurance authorization.  Subjective: Patient was sitting comfortably in chair when seen today.  No new concern, still feeling weak.  Grandson at bedside.  Objective: Vitals:   04/17/24 2012 04/18/24 0410 04/18/24 0944 04/18/24 1419  BP: (!) 119/56 (!) 149/72 125/60 (!) 129/52  Pulse: 78 92 92 87  Resp: 18 18 18 18   Temp: 98 F (36.7 C) 98.2 F (36.8 C) 98.2 F (36.8 C) 98 F (36.7 C)  TempSrc: Oral Oral Oral Oral  SpO2: 92% 93% 92% 94%  Weight:      Height:        Intake/Output Summary (Last 24 hours) at 04/18/2024 1502 Last data filed at 04/18/2024 0900 Gross per 24 hour  Intake 1183.33 ml  Output --  Net 1183.33 ml   Filed Weights   04/15/24 1042  Weight: 97.3 kg   General.  Obese and frail elderly lady, in no acute distress. Pulmonary.  Lungs clear bilaterally, normal respiratory effort. CV.  Regular rate and rhythm, no JVD, rub or murmur. Abdomen.  Soft, nontender, nondistended, BS positive. CNS.  Alert and oriented .  No focal neurologic deficit. Extremities.  No edema, no cyanosis, pulses intact and symmetrical.   Data  Reviewed:  Basic Metabolic Panel: Recent Labs  Lab 04/15/24 1242 04/16/24 0442 04/17/24 0443 04/18/24 0429  NA 124* 125* 129* 128*  K 4.8 5.2* 5.3* 5.3*  CL 86* 89* 94* 92*  CO2 28 27 28 27   GLUCOSE 80 106* 96 91  BUN 32* 38* 32* 28*  CREATININE 1.81* 2.19* 1.63* 1.33*  CALCIUM  9.4 8.7* 9.1 9.7    CBC: Recent Labs  Lab 04/15/24 1242 04/16/24 0442 04/17/24 0443 04/18/24 0429  WBC 8.2 12.1* 6.7 6.7  NEUTROABS 6.4  --   --   --   HGB 11.7* 10.4* 11.0* 11.3*  HCT 35.2* 31.2* 33.6* 34.2*  MCV 93.1 93.1 95.2 93.7  PLT 178 179 181 231     Scheduled Meds:  acetaminophen   1,000 mg Oral TID   amLODipine   5 mg Oral Daily   azithromycin   500 mg Oral QHS   cholecalciferol   1,000 Units Oral Daily   clopidogrel   75 mg Oral Daily   ezetimibe   10 mg Oral Daily   gabapentin   300 mg Oral BID   heparin   5,000 Units Subcutaneous Q8H   Influenza vac split trivalent PF  0.5 mL Intramuscular Tomorrow-1000   magnesium  oxide  400 mg Oral Daily   metoprolol  succinate  100 mg Oral Daily   pantoprazole   40 mg Oral Daily   Continuous Infusions:  cefTRIAXone  (ROCEPHIN )  IV 2 g (04/17/24 2009)   Assessment & Plan:  Acute hypoxic respiratory failure CAP O2 sats dropped down to  the high 80s on room air with improvement on 2 L-weaned back to room air now IS X-ray shows left midlung and basilar opacities Patient started on ceftriaxone  and azithromycin -will complete a 5-day course COVID and influenza are negative  HTN Blood pressure now mildly elevated -Restarting home amlodipine  and metoprolol  -Chlorthalidone should be discontinued due to hyponatremia -Holding losartan  and Lasix  as creatinine is still not at baseline -resume as able  AKI Hyponatremia -CR improved although not back to baseline -Na at 128 - Monitor sodium Discontinue chlorthalidone-  Mild hyperkalemia, potassium at 5.3 Losartan  being held, patient being hydrated -lokelma  x 1 -recheck in the AM  Fall with  mildly displaced pubic rami fractures Chronic pain EDP talked to orthopedics, Dr. Onesimo regarding pubic rami fractures, these are non operable  Continue home doses of oxycodone  5 mg p.o. 3 times daily Patient requesting resumption of her gabapentin , will start with lower doses given AKI   Anemia Slow progressive decrease in hemoglobin, 13 in May, 11.7 yesterday and 10.4 today with hydration -trend  Elevated BNP Diastolic dysfunction per cardiology notes Lasix  and chlorthalidone are being held -apparent history of diastolic heart failure, she does have mild to moderate AS, although echocardiogram from July 2025 shows normal diastolic parameters and normal EF Elevated BNP may be secondary to AKI Lasix  and Chlorhalitone are being held-- will also hold on any further IVF  H/o CVA Continue Plavix      DVT prophylaxis: Heparin  Code Status: FULL-confirmed with patient, and family at bedside. Family Communication: Discussed with grandson at bedside  Studies: No results found.  This record has been created using Conservation officer, historic buildings. Errors have been sought and corrected,but may not always be located. Such creation errors do not reflect on the standard of care.    Tredarius Cobern. MD [facility]  If 7PM-7AM, please contact night-coverage [email or web address]   LOS: 3 days

## 2024-04-18 NOTE — Care Management Important Message (Signed)
 Important Message  Patient Details  Name: Kristen Barry MRN: [id number] Date of Birth: [birth year]   Important Message Given:  Yes - Medicare IM     Izack Hoogland L Keyante Durio 04/18/2024, 12:09 PM

## 2024-04-18 NOTE — TOC Progression Note (Signed)
 Transition of Care [facility]) - Progression Note    Patient Details  Name: Kristen Barry MRN: [id number] Date of Birth: 1941/05/06  [facility]) CM/SW Contact  Hoy DELENA Bigness, LCSW Phone Number: 04/18/2024, 4:03 PM  Clinical Narrative:    Insurance auth still pending for SNF placement.   Expected Discharge Plan: Skilled Nursing Facility Barriers to Discharge: Continued Medical Work up               Expected Discharge Plan and Services In-house Referral: Clinical Social Work Discharge Planning Services: NA Post Acute Care Choice: Skilled Nursing Facility Living arrangements for the past 2 months: Single Family Home                 DME Arranged: N/A DME Agency: NA                   Social Drivers of Health (SDOH) Interventions SDOH Screenings   Food Insecurity: No Food Insecurity (04/15/2024)  Housing: Low Risk  (04/15/2024)  Transportation Needs: No Transportation Needs (04/15/2024)  Utilities: Not At Risk (04/15/2024)  Social Connections: Moderately Integrated (04/15/2024)  Tobacco Use: Medium Risk (04/15/2024)    Readmission Risk Interventions    04/16/2024   11:09 AM 04/16/2024    9:31 AM  Readmission Risk Prevention Plan  Post Dischage Appt Complete Complete  Medication Screening Complete Complete  Transportation Screening Complete Complete

## 2024-04-18 NOTE — Plan of Care (Signed)
   Problem: Education: Goal: Knowledge of General Education information will improve Description Including pain rating scale, medication(s)/side effects and non-pharmacologic comfort measures Outcome: Progressing   Problem: Education: Goal: Knowledge of General Education information will improve Description Including pain rating scale, medication(s)/side effects and non-pharmacologic comfort measures Outcome: Progressing

## 2024-04-19 DIAGNOSIS — J9601 Acute respiratory failure with hypoxia: Secondary | ICD-10-CM | POA: Diagnosis not present

## 2024-04-19 LAB — BASIC METABOLIC PANEL WITH GFR
Anion gap: 10 (ref 5–15)
BUN: 23 mg/dL (ref 8–23)
CO2: 29 mmol/L (ref 22–32)
Calcium: 9.6 mg/dL (ref 8.9–10.3)
Chloride: 93 mmol/L — ABNORMAL LOW (ref 98–111)
Creatinine, Ser: 1.2 mg/dL — ABNORMAL HIGH (ref 0.44–1.00)
GFR, Estimated: 45 mL/min — ABNORMAL LOW (ref >60)
Glucose, Bld: 102 mg/dL — ABNORMAL HIGH (ref 70–99)
Potassium: 4.1 mmol/L (ref 3.5–5.1)
Sodium: 131 mmol/L — ABNORMAL LOW (ref 135–145)

## 2024-04-19 MED ORDER — SODIUM ZIRCONIUM CYCLOSILICATE 10 G PO PACK: 10.0000 g | PACK | Freq: Once | ORAL | Status: DC

## 2024-04-19 MED ORDER — ALUM & MAG HYDROXIDE-SIMETH 200-200-20 MG/5ML PO SUSP
30.0000 mL | ORAL | Status: DC | PRN
  Administered 2024-04-19 – 2024-04-20 (×2): 30 mL via ORAL
  Filled 2024-04-19 (×2): qty 30

## 2024-04-19 NOTE — Plan of Care (Signed)

## 2024-04-19 NOTE — TOC Progression Note (Signed)
 Transition of Care [facility]) - Progression Note    Patient Details  Name: Kristen Barry MRN: [id number] Date of Birth: Dec 05, 1940  [facility]) CM/SW Contact  Lucie Lunger, [place] Phone Number: 04/19/2024, 10:15 AM  Clinical Narrative:    CSW updated that insurance shara is requesting a peer to peer to be completed by MD. CSW sent information to attending. P2P is due by Monday morning. TOC to follow.   Expected Discharge Plan: Skilled Nursing Facility Barriers to Discharge: Continued Medical Work up               Expected Discharge Plan and Services In-house Referral: Clinical Social Work Discharge Planning Services: NA Post Acute Care Choice: Skilled Nursing Facility Living arrangements for the past 2 months: Single Family Home                 DME Arranged: N/A DME Agency: NA                   Social Drivers of Health (SDOH) Interventions SDOH Screenings   Food Insecurity: No Food Insecurity (04/15/2024)  Housing: Low Risk  (04/15/2024)  Transportation Needs: No Transportation Needs (04/15/2024)  Utilities: Not At Risk (04/15/2024)  Social Connections: Moderately Integrated (04/15/2024)  Tobacco Use: Medium Risk (04/15/2024)    Readmission Risk Interventions    04/16/2024   11:09 AM 04/16/2024    9:31 AM  Readmission Risk Prevention Plan  Post Dischage Appt Complete Complete  Medication Screening Complete Complete  Transportation Screening Complete Complete

## 2024-04-19 NOTE — Plan of Care (Signed)
  Problem: Clinical Measurements: Goal: Will remain free from infection Outcome: Progressing   Problem: Activity: Goal: Risk for activity intolerance will decrease Outcome: Progressing   Problem: Coping: Goal: Level of anxiety will decrease Outcome: Progressing   Problem: Pain Managment: Goal: General experience of comfort will improve and/or be controlled Outcome: Progressing   Problem: Safety: Goal: Ability to remain free from injury will improve Outcome: Progressing   Problem: Skin Integrity: Goal: Risk for impaired skin integrity will decrease Outcome: Progressing   Problem: Respiratory: Goal: Ability to maintain a clear airway will improve Outcome: Progressing

## 2024-04-19 NOTE — Progress Notes (Signed)
 PROGRESS NOTE    Kristen Barry  FMW:[id number]  DOB: Sep 16, 1940  DOA: 04/15/2024 PCP: Crecencio Chiquita POUR, FNP Outpatient Specialists:   Hospital course:  83 year old with HTN, HFpEF mild to moderate AS, H/oh CVA 2017, was admitted after a fall that occurred a week and a half ago with persistent right groin pain and feeling weak despite good oral intake.  Workup revealed AKI, hyponatremia and left lung opacities with small left pleural effusion, and mildly displaced fractures of the right inferior and superior pubic rami.  Patient was admitted for treatment of CAP and started on ceftriaxone  and azithromycin   Hyponatremia on presentation is improving but is still significantly low at 128.  Subjective: Patient seen and examined at the bedside earlier today.  Vital signs are stable.  Sodium level improving 131 today.  Potassium also improved to 4.1. Objective: Vitals:   04/18/24 1419 04/18/24 1924 04/19/24 0436 04/19/24 1249  BP: (!) 129/52 132/65 (!) 145/68 (!) 154/97  Pulse: 87 70 74 (!) 104  Resp: 18 18 16    Temp: 98 F (36.7 C) 98.1 F (36.7 C) (!) 97.4 F (36.3 C) 98.2 F (36.8 C)  TempSrc: Oral Oral Oral Oral  SpO2: 94% 97% 93% 97%  Weight:      Height:        Intake/Output Summary (Last 24 hours) at 04/19/2024 1434 Last data filed at 04/19/2024 1355 Gross per 24 hour  Intake 240 ml  Output 500 ml  Net -260 ml   Filed Weights   04/15/24 1042  Weight: 97.3 kg   General.  Obese and frail elderly lady, in no acute distress. Pulmonary.  Lungs clear bilaterally, normal respiratory effort. CV.  Regular rate and rhythm, no JVD, rub or murmur. Abdomen.  Soft, nontender, nondistended, BS positive. CNS.  Alert and oriented .  No focal neurologic deficit. Extremities.  No edema, no cyanosis, pulses intact and symmetrical.   Data Reviewed:  Basic Metabolic Panel: Recent Labs  Lab 04/15/24 1242 04/16/24 0442 04/17/24 0443 04/18/24 0429 04/19/24 1039  NA 124*  125* 129* 128* 131*  K 4.8 5.2* 5.3* 5.3* 4.1  CL 86* 89* 94* 92* 93*  CO2 28 27 28 27 29   GLUCOSE 80 106* 96 91 102*  BUN 32* 38* 32* 28* 23  CREATININE 1.81* 2.19* 1.63* 1.33* 1.20*  CALCIUM  9.4 8.7* 9.1 9.7 9.6    CBC: Recent Labs  Lab 04/15/24 1242 04/16/24 0442 04/17/24 0443 04/18/24 0429  WBC 8.2 12.1* 6.7 6.7  NEUTROABS 6.4  --   --   --   HGB 11.7* 10.4* 11.0* 11.3*  HCT 35.2* 31.2* 33.6* 34.2*  MCV 93.1 93.1 95.2 93.7  PLT 178 179 181 231     Scheduled Meds:  acetaminophen   1,000 mg Oral TID   amLODipine   5 mg Oral Daily   azithromycin   500 mg Oral QHS   cholecalciferol   1,000 Units Oral Daily   clopidogrel   75 mg Oral Daily   ezetimibe   10 mg Oral Daily   gabapentin   300 mg Oral BID   heparin   5,000 Units Subcutaneous Q8H   Influenza vac split trivalent PF  0.5 mL Intramuscular Tomorrow-1000   magnesium  oxide  400 mg Oral Daily   metoprolol  succinate  100 mg Oral Daily   pantoprazole   40 mg Oral Daily   sodium zirconium cyclosilicate   10 g Oral Once   Continuous Infusions:  cefTRIAXone  (ROCEPHIN )  IV 2 g (04/18/24 2002)   Assessment & Plan:  Acute hypoxic respiratory failure CAP O2 sats dropped down to the high 80s on room air with improvement on 2 L-weaned back to room air now IS X-ray shows left midlung and basilar opacities Patient started on ceftriaxone  and azithromycin -will complete a 5-day course COVID and influenza are negative  HTN Blood pressure now mildly elevated -Restarting home amlodipine  and metoprolol  -Chlorthalidone should be discontinued due to hyponatremia -Holding losartan  and Lasix  at this time. -resume as able  AKI Hyponatremia - Sodium progressively improving to 131 today. - Monitor sodium Discontinue chlorthalidone-  Mild hyperkalemia, calcium  improved to 4.1, received Lokelma  yesterday Will continue to hold losartan   Fall with mildly displaced pubic rami fractures Chronic pain EDP talked to orthopedics, Dr.  Onesimo regarding pubic rami fractures, these are non operable  Continue home doses of oxycodone  5 mg p.o. 3 times daily Patient requesting resumption of her gabapentin , will start with lower doses given AKI   Anemia Slow progressive decrease in hemoglobin, likely from hydration.  Improved to 11.3, baseline.  Elevated BNP Diastolic dysfunction per cardiology notes Lasix  and chlorthalidone are being held -apparent history of diastolic heart failure, she does have mild to moderate AS, although echocardiogram from July 2025 shows normal diastolic parameters and normal EF Elevated BNP may be secondary to AKI Lasix  and Chlorhalitone are being held-- will also hold on any further IVF  H/o CVA Continue Plavix    Disposition plan: Pursuing SNF.  DVT prophylaxis: Heparin  Code Status: FULL-confirmed with patient, and family at bedside. Family Communication: Discussed with grandson at bedside  Studies: No results found.  This record has been created using Conservation officer, historic buildings. Errors have been sought and corrected,but may not always be located. Such creation errors do not reflect on the standard of care.    Briante Loveall. MD [facility]  If 7PM-7AM, please contact night-coverage [email or web address]   LOS: 4 days

## 2024-04-20 DIAGNOSIS — J9601 Acute respiratory failure with hypoxia: Secondary | ICD-10-CM | POA: Diagnosis not present

## 2024-04-20 LAB — BASIC METABOLIC PANEL WITH GFR
Anion gap: 8 (ref 5–15)
BUN: 18 mg/dL (ref 8–23)
CO2: 31 mmol/L (ref 22–32)
Calcium: 9.6 mg/dL (ref 8.9–10.3)
Chloride: 94 mmol/L — ABNORMAL LOW (ref 98–111)
Creatinine, Ser: 1.03 mg/dL — ABNORMAL HIGH (ref 0.44–1.00)
GFR, Estimated: 54 mL/min — ABNORMAL LOW (ref >60)
Glucose, Bld: 84 mg/dL (ref 70–99)
Potassium: 4.2 mmol/L (ref 3.5–5.1)
Sodium: 133 mmol/L — ABNORMAL LOW (ref 135–145)

## 2024-04-20 LAB — CBC WITH DIFFERENTIAL/PLATELET
Abs Immature Granulocytes: 0.04 K/uL (ref 0.00–0.07)
Basophils Absolute: 0.1 K/uL (ref 0.0–0.1)
Basophils Relative: 1 %
Eosinophils Absolute: 0.2 K/uL (ref 0.0–0.5)
Eosinophils Relative: 4 %
HCT: 34.7 % — ABNORMAL LOW (ref 36.0–46.0)
Hemoglobin: 11.1 g/dL — ABNORMAL LOW (ref 12.0–15.0)
Immature Granulocytes: 1 %
Lymphocytes Relative: 18 %
Lymphs Abs: 1.1 K/uL (ref 0.7–4.0)
MCH: 30.2 pg (ref 26.0–34.0)
MCHC: 32 g/dL (ref 30.0–36.0)
MCV: 94.3 fL (ref 80.0–100.0)
Monocytes Absolute: 0.7 K/uL (ref 0.1–1.0)
Monocytes Relative: 13 %
Neutro Abs: 3.7 K/uL (ref 1.7–7.7)
Neutrophils Relative %: 63 %
Platelets: 268 K/uL (ref 150–400)
RBC: 3.68 MIL/uL — ABNORMAL LOW (ref 3.87–5.11)
RDW: 13 % (ref 11.5–15.5)
WBC: 5.8 K/uL (ref 4.0–10.5)
nRBC: 0 % (ref 0.0–0.2)

## 2024-04-20 MED ORDER — AMLODIPINE BESYLATE 5 MG PO TABS
5.0000 mg | ORAL_TABLET | Freq: Two times a day (BID) | ORAL | Status: DC
  Administered 2024-04-20 – 2024-04-21 (×2): 5 mg via ORAL
  Filled 2024-04-20 (×2): qty 1

## 2024-04-20 NOTE — Plan of Care (Signed)
  Problem: Education: Goal: Knowledge of General Education information will improve Description: Including pain rating scale, medication(s)/side effects and non-pharmacologic comfort measures Outcome: Progressing   Problem: Clinical Measurements: Goal: Will remain free from infection Outcome: Progressing Goal: Diagnostic test results will improve Outcome: Progressing   Problem: Activity: Goal: Risk for activity intolerance will decrease Outcome: Progressing   Problem: Coping: Goal: Level of anxiety will decrease Outcome: Progressing   Problem: Pain Managment: Goal: General experience of comfort will improve and/or be controlled Outcome: Progressing   Problem: Safety: Goal: Ability to remain free from injury will improve Outcome: Progressing   Problem: Skin Integrity: Goal: Risk for impaired skin integrity will decrease Outcome: Progressing

## 2024-04-20 NOTE — Progress Notes (Signed)
 PROGRESS NOTE    Kristen Barry  FMW:[id number]  DOB: 03-25-1941  DOA: 04/15/2024 PCP: Crecencio Chiquita POUR, FNP Outpatient Specialists:   Hospital course:  83 year old with HTN, HFpEF mild to moderate AS, H/oh CVA 2017, was admitted after a fall that occurred a week and a half ago with persistent right groin pain and feeling weak despite good oral intake.  Workup revealed AKI, hyponatremia and left lung opacities with small left pleural effusion, and mildly displaced fractures of the right inferior and superior pubic rami.  Patient was admitted for treatment of CAP and started on ceftriaxone  and azithromycin   Hyponatremia on presentation 124 progressively improving 133. Subjective: Patient seen and examined at the bedside earlier today.  No acute issues overnight.  Patient denies chest pain or shortness of breath.  She has been up to bedside commode.  Vital signs are stable.  Sodium level improving 133 today.   Objective: Vitals:   04/19/24 1249 04/19/24 1943 04/20/24 0507 04/20/24 1437  BP: (!) 154/97 (!) 141/67 (!) 146/88 136/62  Pulse: (!) 104 72 93 89  Resp:  17 17 20   Temp: 98.2 F (36.8 C) 98.6 F (37 C) 98.6 F (37 C) 98.2 F (36.8 C)  TempSrc: Oral Oral Oral Oral  SpO2: 97% 93% 100% 92%  Weight:      Height:        Intake/Output Summary (Last 24 hours) at 04/20/2024 1508 Last data filed at 04/20/2024 0500 Gross per 24 hour  Intake 240 ml  Output 300 ml  Net -60 ml   Filed Weights   04/15/24 1042  Weight: 97.3 kg   General.  Obese and frail elderly lady, in no acute distress. Pulmonary.  Lungs clear bilaterally, normal respiratory effort. CV.  Regular rate and rhythm, no JVD, rub or murmur. Abdomen.  Soft, nontender, nondistended, BS positive. CNS.  Alert and oriented .  No focal neurologic deficit. Extremities.  No edema, no cyanosis, pulses intact and symmetrical.   Data Reviewed:  Basic Metabolic Panel: Recent Labs  Lab 04/16/24 0442  04/17/24 0443 04/18/24 0429 04/19/24 1039 04/20/24 0503  NA 125* 129* 128* 131* 133*  K 5.2* 5.3* 5.3* 4.1 4.2  CL 89* 94* 92* 93* 94*  CO2 27 28 27 29 31   GLUCOSE 106* 96 91 102* 84  BUN 38* 32* 28* 23 18  CREATININE 2.19* 1.63* 1.33* 1.20* 1.03*  CALCIUM  8.7* 9.1 9.7 9.6 9.6    CBC: Recent Labs  Lab 04/15/24 1242 04/16/24 0442 04/17/24 0443 04/18/24 0429 04/20/24 0503  WBC 8.2 12.1* 6.7 6.7 5.8  NEUTROABS 6.4  --   --   --  3.7  HGB 11.7* 10.4* 11.0* 11.3* 11.1*  HCT 35.2* 31.2* 33.6* 34.2* 34.7*  MCV 93.1 93.1 95.2 93.7 94.3  PLT 178 179 181 231 268     Scheduled Meds:  acetaminophen   1,000 mg Oral TID   amLODipine   5 mg Oral Daily   cholecalciferol   1,000 Units Oral Daily   clopidogrel   75 mg Oral Daily   ezetimibe   10 mg Oral Daily   gabapentin   300 mg Oral BID   heparin   5,000 Units Subcutaneous Q8H   Influenza vac split trivalent PF  0.5 mL Intramuscular Tomorrow-1000   magnesium  oxide  400 mg Oral Daily   metoprolol  succinate  100 mg Oral Daily   pantoprazole   40 mg Oral Daily   sodium zirconium cyclosilicate   10 g Oral Once   Continuous Infusions:   Assessment &  Plan:  Acute hypoxic respiratory failure CAP Hypoxia improved, patient is weaned off supplemental oxygen.   Continue IS X-ray shows left midlung and basilar opacities Patient started on ceftriaxone  and azithromycin -will complete a 5-day course COVID and influenza are negative  HTN Blood pressure now mildly elevated -Restarting home amlodipine  and metoprolol --> increase amlodipine  to 5 mg twice daily -Holding losartan  due to hyperkalemia -Chlorthalidone should be discontinued due to hyponatremia -Holding  Lasix  at this time. -resume as able  AKI Hyponatremia - Baseline creatinine appears to be around 1.1.  Creatinine 2.19 on admission has returned to  1.0 -Sodium progressively improving to 133 today. - Monitor sodium Discontinue chlorthalidone- Resume Lasix  when sodium  stabilizes.  Mild hyperkalemia, resolved.  Received Lokelma  11/21  Will continue to hold losartan   Fall with mildly displaced pubic rami fractures Chronic pain EDP talked to orthopedics, Dr. Onesimo regarding pubic rami fractures, these are non operable  Continue home doses of oxycodone  5 mg p.o. 3 times daily Gabapentin   Anemia Slow progressive decrease in hemoglobin, likely from hydration.  Improved to 11.3, baseline.  Elevated BNP Diastolic dysfunction per cardiology notes Lasix  and chlorthalidone are being held -apparent history of diastolic heart failure, she does have mild to moderate AS, although echocardiogram from July 2025 shows normal diastolic parameters and normal EF Elevated BNP may be secondary to AKI Lasix  and Chlorhalitone are being held-- will also hold on any further IVF  H/o CVA Continue Plavix    Disposition plan: Pursuing SNF.  Peer to peer completed today and she is approved for SNF TOC notified.  Patient is medically stable for discharge  DVT prophylaxis: Heparin  Code Status: FULL- Family Communication:  Studies: No results found.  This record has been created using Conservation officer, historic buildings. Errors have been sought and corrected,but may not always be located. Such creation errors do not reflect on the standard of care.    Nyemah Watton. MD [facility]  If 7PM-7AM, please contact night-coverage [email or web address]   LOS: 5 days

## 2024-04-21 LAB — BASIC METABOLIC PANEL WITH GFR
Anion gap: 10 (ref 5–15)
BUN: 14 mg/dL (ref 8–23)
CO2: 30 mmol/L (ref 22–32)
Calcium: 9.7 mg/dL (ref 8.9–10.3)
Chloride: 93 mmol/L — ABNORMAL LOW (ref 98–111)
Creatinine, Ser: 0.85 mg/dL (ref 0.44–1.00)
GFR, Estimated: >60 mL/min (ref >60)
Glucose, Bld: 89 mg/dL (ref 70–99)
Potassium: 3.8 mmol/L (ref 3.5–5.1)
Sodium: 132 mmol/L — ABNORMAL LOW (ref 135–145)

## 2024-04-21 MED ORDER — OXYCODONE HCL 5 MG PO TABS: 5.0000 mg | ORAL_TABLET | Freq: Three times a day (TID) | ORAL | 0 refills | Status: DC | PRN

## 2024-04-21 MED ORDER — PANTOPRAZOLE SODIUM 40 MG PO TBEC: 40.0000 mg | DELAYED_RELEASE_TABLET | Freq: Every evening | ORAL | Status: DC

## 2024-04-21 MED ORDER — POTASSIUM CHLORIDE CRYS ER 10 MEQ PO TBCR: 10.0000 meq | EXTENDED_RELEASE_TABLET | ORAL | Status: DC

## 2024-04-21 MED ORDER — CELECOXIB 100 MG PO CAPS: 100.0000 mg | ORAL_CAPSULE | Freq: Every day | ORAL | Status: DC | PRN

## 2024-04-21 MED ORDER — FUROSEMIDE 20 MG PO TABS: 20.0000 mg | ORAL_TABLET | ORAL | Status: AC

## 2024-04-21 MED ORDER — POLYETHYLENE GLYCOL 3350 17 G PO PACK: 17.0000 g | PACK | Freq: Every day | ORAL | Status: DC

## 2024-04-21 NOTE — Discharge Instructions (Signed)
 IMPORTANT INFORMATION: PAY CLOSE ATTENTION   PHYSICIAN DISCHARGE INSTRUCTIONS  Follow with Primary care provider  Stamey, Chiquita POUR, FNP  and other consultants as instructed by your Hospitalist Physician  SEEK MEDICAL CARE OR RETURN TO EMERGENCY ROOM IF SYMPTOMS COME BACK, WORSEN OR NEW PROBLEM DEVELOPS   Please note: You were cared for by a hospitalist during your hospital stay. Every effort will be made to forward records to your primary care provider.  You can request that your primary care provider send for your hospital records if they have not received them.  Once you are discharged, your primary care physician will handle any further medical issues. Please note that NO REFILLS for any discharge medications will be authorized once you are discharged, as it is imperative that you return to your primary care physician (or establish a relationship with a primary care physician if you do not have one) for your post hospital discharge needs so that they can reassess your need for medications and monitor your lab values.  Please get a complete blood count and chemistry panel checked by your Primary MD at your next visit, and again as instructed by your Primary MD.  Get Medicines reviewed and adjusted: Please take all your medications with you for your next visit with your Primary MD  Laboratory/radiological data: Please request your Primary MD to go over all hospital tests and procedure/radiological results at the follow up, please ask your primary care provider to get all Hospital records sent to his/her office.  In some cases, they will be blood work, cultures and biopsy results pending at the time of your discharge. Please request that your primary care provider follow up on these results.  If you are diabetic, please bring your blood sugar readings with you to your follow up appointment with primary care.    Please call and make your follow up appointments as soon as possible.    Also  Note the following: If you experience worsening of your admission symptoms, develop shortness of breath, life threatening emergency, suicidal or homicidal thoughts you must seek medical attention immediately by calling 911 or calling your MD immediately  if symptoms less severe.  You must read complete instructions/literature along with all the possible adverse reactions/side effects for all the Medicines you take and that have been prescribed to you. Take any new Medicines after you have completely understood and accpet all the possible adverse reactions/side effects.   Do not drive when taking Pain medications or sleeping medications (Benzodiazepines)  Do not take more than prescribed Pain, Sleep and Anxiety Medications. It is not advisable to combine anxiety,sleep and pain medications without talking with your primary care practitioner  Special Instructions: If you have smoked or chewed Tobacco  in the last 2 yrs please stop smoking, stop any regular Alcohol  and or any Recreational drug use.  Wear Seat belts while driving.  Do not drive if taking any narcotic, mind altering or controlled substances or recreational drugs or alcohol.

## 2024-04-21 NOTE — TOC Transition Note (Signed)
 Transition of Care [facility]) - Discharge Note   Patient Details  Name: Kristen Barry MRN: [id number] Date of Birth: [birth year]  Transition of [facility] CM/SW Contact:  Mcarthur Saddie Kim, LCSW Phone Number: 04/21/2024, 9:50 AM   Clinical Narrative:  Pt d/c today to [facility] Pt and facility aware and agreeable. Pt states she will notify family. Authorization received. Granddaughter to provide transport. Will send D/C summary to SNF upon completion. RN given number to call report.      Final next level of care: Skilled Nursing Facility Barriers to Discharge: Barriers Resolved   Patient Goals and CMS Choice Patient states their goals for this hospitalization and ongoing recovery are:: To go to SNF CMS Medicare.gov Compare Post Acute Care list provided to:: Patient Choice offered to / list presented to : Patient Benjamin Perez ownership interest in [facility].provided to:: Patient    Discharge Placement              Patient chooses bed at: [facility] Patient to be transferred to facility by: family Name of family member notified: Pt only Patient and family notified of of transfer: 04/21/24  Discharge Plan and Services Additional resources added to the After Visit Summary for   In-house Referral: Clinical Social Work Discharge Planning Services: NA Post Acute Care Choice: Skilled Nursing Facility          DME Arranged: N/A DME Agency: NA                  Social Drivers of Health (SDOH) Interventions SDOH Screenings   Food Insecurity: No Food Insecurity (04/15/2024)  Housing: Low Risk  (04/15/2024)  Transportation Needs: No Transportation Needs (04/15/2024)  Utilities: Not At Risk (04/15/2024)  Social Connections: Moderately Integrated (04/15/2024)  Tobacco Use: Medium Risk (04/15/2024)     Readmission Risk Interventions    04/16/2024   11:09 AM 04/16/2024    9:31 AM  Readmission Risk Prevention Plan  Post Dischage Appt Complete  Complete  Medication Screening Complete Complete  Transportation Screening Complete Complete

## 2024-04-21 NOTE — Plan of Care (Signed)
   Problem: Education: Goal: Knowledge of General Education information will improve Description: Including pain rating scale, medication(s)/side effects and non-pharmacologic comfort measures Outcome: Progressing   Problem: Nutrition: Goal: Adequate nutrition will be maintained Outcome: Progressing   Problem: Safety: Goal: Ability to remain free from injury will improve Outcome: Progressing

## 2024-04-21 NOTE — Care Management Important Message (Signed)
 Important Message  Patient Details  Name: Kristen Barry MRN: [id number] Date of Birth: Apr 12, 1941   Important Message Given:  Yes - Medicare IM     Rubee Vega L Kang Ishida 04/21/2024, 10:01 AM

## 2024-04-21 NOTE — Discharge Summary (Signed)
 Physician Discharge Summary  Kristen Barry FMW:[id number] DOB: February 12, 1941 DOA: 04/15/2024  PCP: Crecencio Chiquita POUR, FNP  Admit date: 04/15/2024 Discharge date: 04/21/2024  Disposition:  SNF Rehab  Recommendations for Outpatient Follow-up:  Follow up with PCP in 2 weeks Please obtain BMP in one week Please obtain CXR in 4-6 weeks to ensure resolution of pneumonia  Home Health:  SNF  Discharge Condition: STABLE   CODE STATUS: FULL DIET: Heart Healthy   Brief Hospitalization Summary: Please see all hospital notes, images, labs for full details of the hospitalization. Admission provider HPI:  83 year old with HTN, HFpEF mild to moderate AS, H/oh CVA 2017, was admitted after a fall that occurred a week and a half ago with persistent right groin pain and feeling weak despite good oral intake.  Workup revealed AKI, hyponatremia and left lung opacities with small left pleural effusion, and mildly displaced fractures of the right inferior and superior pubic rami.  Patient was admitted for treatment of CAP and started on ceftriaxone  and azithromycin    Hyponatremia on presentation 124 progressively improving 133.  HOSPITAL COURSE BY LISTED PROBLEMS ADDRESSED    Acute hypoxic respiratory failure CAP Hypoxia improved, patient is weaned off supplemental oxygen.   Encouraged IS X-ray showed left midlung and basilar opacities Patient started on ceftriaxone  and azithromycin -completed a 5-day course of antibiotics COVID and influenza are negative   HTN -Restarted home amlodipine , losartan  and metoprolol  -Chlorthalidone was discontinued due to hyponatremia -restarting home Lasix  at lower dose 20 mg QOD   AKI -- RESOLVED Hyponatremia - Baseline creatinine appears to be around 1.1.  Creatinine 2.19 on admission has improved to 0.85 -Sodium improved to 132  - recheck BMP in 1 week recommended - Discontinued chlorthalidone-   Mild hyperkalemia, resolved.  Received Lokelma  11/21    Fall  with mildly displaced pubic rami fractures Chronic pain EDP talked to orthopedics, Dr. Onesimo regarding pubic rami fractures, these are non operable  Continue home doses of oxycodone  5 mg p.o. 3 times daily Gabapentin    Anemia Slow progressive decrease in hemoglobin, likely from hydration.  Improved to 11.3, baseline.   Elevated BNP Diastolic dysfunction per cardiology notes Lasix  and chlorthalidone initially held -apparent history of diastolic heart failure, she does have mild to moderate AS, although echocardiogram from July 2025 shows normal diastolic parameters and normal EF Elevated BNP was likely secondary to AKI Lasix  restarted at 20 mg every other day   H/o CVA Continue Plavix   Discharge Diagnoses:  Principal Problem:   Acute hypoxemic respiratory failure (HCC) Active Problems:   Essential hypertension   Obesity   PNA (pneumonia)   Fall at home, initial encounter   AKI (acute kidney injury)   Discharge Instructions:  Allergies as of 04/21/2024       Reactions   Ace Inhibitors Cough      Codeine Other (See Comments)   Stomach upset        Medication List     STOP taking these medications    chlorthalidone 50 MG tablet Commonly known as: HYGROTON   omeprazole 40 MG capsule Commonly known as: PRILOSEC Replaced by: pantoprazole  40 MG tablet       TAKE these medications    acetaminophen  500 MG tablet Commonly known as: TYLENOL  Take 1,000 mg by mouth in the morning and at bedtime.   amLODipine  5 MG tablet Commonly known as: NORVASC  Take 1 tablet (5 mg total) by mouth daily. What changed: when to take this   celecoxib  100 MG capsule  Commonly known as: CELEBREX  Take 1 capsule (100 mg total) by mouth daily as needed for moderate pain (pain score 4-6). What changed:  medication strength how much to take when to take this reasons to take this   cholecalciferol  1000 units tablet Commonly known as: VITAMIN D  Take 1,000 Units by mouth daily.    clopidogrel  75 MG tablet Commonly known as: PLAVIX  Take 1 tablet (75 mg total) by mouth daily.   cyclobenzaprine  5 MG tablet Commonly known as: FLEXERIL  Take 5 mg by mouth 3 (three) times daily as needed for muscle spasms.   ezetimibe  10 MG tablet Commonly known as: ZETIA  Take 10 mg by mouth daily.   Fish Oil 1000 MG Caps Take 1 capsule by mouth in the morning and at bedtime.   furosemide  20 MG tablet Commonly known as: LASIX  Take 1 tablet (20 mg total) by mouth every other day. Start taking on: April 22, 2024 What changed: when to take this   gabapentin  600 MG tablet Commonly known as: NEURONTIN  Take 600-1,200 mg by mouth 2 (two) times daily. 600 mg am, 1200 mg pm   losartan  100 MG tablet Commonly known as: COZAAR  Take 100 mg by mouth daily.   magnesium  gluconate 500 (27 Mg) MG Tabs tablet Commonly known as: MAGONATE Take 500 mg by mouth daily.   meclizine  25 MG tablet Commonly known as: ANTIVERT  Take 25 mg by mouth as needed for dizziness.   metoprolol  succinate 100 MG 24 hr tablet Commonly known as: TOPROL -XL Take 100 mg by mouth daily. Take with or immediately following a meal.   multivitamin tablet Take 1 tablet by mouth daily.   ondansetron  4 MG disintegrating tablet Commonly known as: ZOFRAN -ODT Take 4 mg by mouth every 8 (eight) hours as needed for nausea or vomiting.   oxyCODONE  5 MG immediate release tablet Commonly known as: Oxy IR/ROXICODONE  Take 1 tablet (5 mg total) by mouth every 8 (eight) hours as needed for severe pain (pain score 7-10). What changed:  when to take this reasons to take this   pantoprazole  40 MG tablet Commonly known as: PROTONIX  Take 1 tablet (40 mg total) by mouth every evening. Replaces: omeprazole 40 MG capsule   polyethylene glycol 17 g packet Commonly known as: MIRALAX  / GLYCOLAX  Take 17 g by mouth daily.   potassium chloride  10 MEQ tablet Commonly known as: KLOR-CON  M Take 1 tablet (10 mEq total) by mouth  every other day. Start taking on: April 22, 2024 What changed:  medication strength how much to take when to take this   vitamin B-12 500 MCG tablet Commonly known as: CYANOCOBALAMIN Take 1,000 mcg by mouth daily.        Contact information for follow-up providers     Stamey, Chiquita POUR, FNP. Schedule an appointment as soon as possible for a visit in 2 week(s).   Specialty: Family Medicine Why: Hospital Follow Up Contact information: [address] [place] [address] [phone]              Contact information for after-discharge care     Destination     [place] .   Service: Skilled Nursing Contact information: [address] [place]  [address] [phone]                    Allergies  Allergen Reactions   Ace Inhibitors Cough        Codeine Other (See Comments)    Stomach upset  Allergies as of 04/21/2024       Reactions   Ace Inhibitors Cough      Codeine Other (See Comments)   Stomach upset        Medication List     STOP taking these medications    chlorthalidone 50 MG tablet Commonly known as: HYGROTON   omeprazole 40 MG capsule Commonly known as: PRILOSEC Replaced by: pantoprazole  40 MG tablet       TAKE these medications    acetaminophen  500 MG tablet Commonly known as: TYLENOL  Take 1,000 mg by mouth in the morning and at bedtime.   amLODipine  5 MG tablet Commonly known as: NORVASC  Take 1 tablet (5 mg total) by mouth daily. What changed: when to take this   celecoxib  100 MG capsule Commonly known as: CELEBREX  Take 1 capsule (100 mg total) by mouth daily as needed for moderate pain (pain score 4-6). What changed:  medication strength how much to take when to take this reasons to take this   cholecalciferol  1000 units tablet Commonly known as: VITAMIN D  Take 1,000 Units by mouth daily.   clopidogrel  75 MG tablet Commonly known as: PLAVIX  Take 1 tablet (75 mg  total) by mouth daily.   cyclobenzaprine  5 MG tablet Commonly known as: FLEXERIL  Take 5 mg by mouth 3 (three) times daily as needed for muscle spasms.   ezetimibe  10 MG tablet Commonly known as: ZETIA  Take 10 mg by mouth daily.   Fish Oil 1000 MG Caps Take 1 capsule by mouth in the morning and at bedtime.   furosemide  20 MG tablet Commonly known as: LASIX  Take 1 tablet (20 mg total) by mouth every other day. Start taking on: April 22, 2024 What changed: when to take this   gabapentin  600 MG tablet Commonly known as: NEURONTIN  Take 600-1,200 mg by mouth 2 (two) times daily. 600 mg am, 1200 mg pm   losartan  100 MG tablet Commonly known as: COZAAR  Take 100 mg by mouth daily.   magnesium  gluconate 500 (27 Mg) MG Tabs tablet Commonly known as: MAGONATE Take 500 mg by mouth daily.   meclizine  25 MG tablet Commonly known as: ANTIVERT  Take 25 mg by mouth as needed for dizziness.   metoprolol  succinate 100 MG 24 hr tablet Commonly known as: TOPROL -XL Take 100 mg by mouth daily. Take with or immediately following a meal.   multivitamin tablet Take 1 tablet by mouth daily.   ondansetron  4 MG disintegrating tablet Commonly known as: ZOFRAN -ODT Take 4 mg by mouth every 8 (eight) hours as needed for nausea or vomiting.   oxyCODONE  5 MG immediate release tablet Commonly known as: Oxy IR/ROXICODONE  Take 1 tablet (5 mg total) by mouth every 8 (eight) hours as needed for severe pain (pain score 7-10). What changed:  when to take this reasons to take this   pantoprazole  40 MG tablet Commonly known as: PROTONIX  Take 1 tablet (40 mg total) by mouth every evening. Replaces: omeprazole 40 MG capsule   polyethylene glycol 17 g packet Commonly known as: MIRALAX  / GLYCOLAX  Take 17 g by mouth daily.   potassium chloride  10 MEQ tablet Commonly known as: KLOR-CON  M Take 1 tablet (10 mEq total) by mouth every other day. Start taking on: April 22, 2024 What changed:   medication strength how much to take when to take this   vitamin B-12 500 MCG tablet Commonly known as: CYANOCOBALAMIN Take 1,000 mcg by mouth daily.        Procedures/Studies: BOEING  Chest Portable 1 View Result Date: 04/15/2024 EXAM: 1 VIEW(S) XRAY OF THE CHEST 04/15/2024 03:05:03 PM COMPARISON: Comparison 10/12/2023. CLINICAL HISTORY: SOB. FINDINGS: LUNGS AND PLEURA: Left mid lung and basilar densities are noted concerning for atelectasis possibly pneumonia. Small left pleural effusion is noted. No pneumothorax. HEART AND MEDIASTINUM: Stable cardiomegaly. BONES AND SOFT TISSUES: No acute osseous abnormality. IMPRESSION: 1. Left mid lung and basilar opacities, concerning for atelectasis or pneumonia. 2. Small left pleural effusion. 3. Stable cardiomegaly. Electronically signed by: Lynwood Seip MD 04/15/2024 03:28 PM EST RP Workstation: [id number]   DG Lumbar Spine Complete Result Date: 04/15/2024 EXAM: 4 VIEW(S) XRAY OF THE LUMBAR SPINE 04/15/2024 12:49:20 PM COMPARISON: Lumbar spine 06/08/2018. CLINICAL HISTORY: fall FINDINGS: LUMBAR SPINE: BONES: Chronic superior endplate compression fracture at L2. No acute fracture. No aggressive appearing osseous lesion. DISCS AND DEGENERATIVE CHANGES: Chronic anterolisthesis L4 on L5 by 10 mm. SOFT TISSUES: No acute abnormality. IMPRESSION: 1. No acute findings in the lumbar spine. Electronically signed by: Norleen Boxer MD 04/15/2024 01:36 PM EST RP Workstation: [id number]   DG Hip Unilat W or Wo Pelvis 2-3 Views Right Result Date: 04/15/2024 EXAM: 2 or 3 VIEW(S) XRAY OF THE RIGHT HIP 04/15/2024 12:49:20 PM COMPARISON: 05/20/2018 CLINICAL HISTORY: fall FINDINGS: BONES AND JOINTS: Mildly displaced fractures are seen involving the right inferior and superior pubic rami. The visualized proximal femur is unremarkable. The hip joint is maintained. No significant degenerative changes. SOFT TISSUES: The soft tissues are unremarkable. IMPRESSION: 1. Mildly  displaced fractures of the right inferior and superior pubic rami. Electronically signed by: Lynwood Seip MD 04/15/2024 01:15 PM EST RP Workstation: [id number]     Subjective: Pt without complaints, agreeable to SNF rehab placement today.    Discharge Exam: Vitals:   04/21/24 0450 04/21/24 0851  BP: (!) 143/80 (!) 162/87  Pulse: 69   Resp: 18 17  Temp: 98.2 F (36.8 C)   SpO2: 94% 93%   Vitals:   04/20/24 2056 04/20/24 2142 04/21/24 0450 04/21/24 0851  BP: (!) 149/95 (!) 149/95 (!) 143/80 (!) 162/87  Pulse: 82  69   Resp: 18  18 17   Temp: 98.4 F (36.9 C)  98.2 F (36.8 C)   TempSrc: Oral  Oral   SpO2: 96%  94% 93%  Weight:      Height:       General: Pt is alert, awake, not in acute distress Cardiovascular: normal S1/S2 +, no rubs, no gallops Respiratory: CTA bilaterally, no wheezing, no rhonchi Abdominal: Soft, NT, ND, bowel sounds + Extremities: no edema, no cyanosis   The results of significant diagnostics from this hospitalization (including imaging, microbiology, ancillary and laboratory) are listed below for reference.     Microbiology: Recent Results (from the past 240 hours)  Resp panel by RT-PCR (RSV, Flu A&B, Covid) Anterior Nasal Swab     Status: None   Collection Time: 04/15/24  9:57 PM   Specimen: Anterior Nasal Swab  Result Value Ref Range Status   SARS Coronavirus 2 by RT PCR NEGATIVE NEGATIVE Final    Comment: (NOTE) SARS-CoV-2 target nucleic acids are NOT DETECTED.  The SARS-CoV-2 RNA is generally detectable in upper respiratory specimens during the acute phase of infection. The lowest concentration of SARS-CoV-2 viral copies this assay can detect is 138 copies/mL. A negative result does not preclude SARS-Cov-2 infection and should not be used as the sole basis for treatment or other patient management decisions. A negative result may occur with  improper specimen collection/handling, submission of  specimen other than nasopharyngeal swab,  presence of viral mutation(s) within the areas targeted by this assay, and inadequate number of viral copies(<138 copies/mL). A negative result must be combined with clinical observations, patient history, and epidemiological information. The expected result is Negative.  Fact Sheet for Patients:  bloggercourse.com  Fact Sheet for Healthcare Providers:  seriousbroker.it  This test is no t yet approved or cleared by the [place]  FDA and  has been authorized for detection and/or diagnosis of SARS-CoV-2 by FDA under an Emergency Use Authorization (EUA). This EUA will remain  in effect (meaning this test can be used) for the duration of the COVID-19 declaration under Section 564(b)(1) of the Act, 21 U.S.C.section 360bbb-3(b)(1), unless the authorization is terminated  or revoked sooner.       Influenza A by PCR NEGATIVE NEGATIVE Final   Influenza B by PCR NEGATIVE NEGATIVE Final    Comment: (NOTE) The Xpert Xpress SARS-CoV-2/FLU/RSV plus assay is intended as an aid in the diagnosis of influenza from Nasopharyngeal swab specimens and should not be used as a sole basis for treatment. Nasal washings and aspirates are unacceptable for Xpert Xpress SARS-CoV-2/FLU/RSV testing.  Fact Sheet for Patients: bloggercourse.com  Fact Sheet for Healthcare Providers: seriousbroker.it  This test is not yet approved or cleared by the [place]  FDA and has been authorized for detection and/or diagnosis of SARS-CoV-2 by FDA under an Emergency Use Authorization (EUA). This EUA will remain in effect (meaning this test can be used) for the duration of the COVID-19 declaration under Section 564(b)(1) of the Act, 21 U.S.C. section 360bbb-3(b)(1), unless the authorization is terminated or revoked.     Resp Syncytial Virus by PCR NEGATIVE NEGATIVE Final    Comment: (NOTE) Fact Sheet for  Patients: bloggercourse.com  Fact Sheet for Healthcare Providers: seriousbroker.it  This test is not yet approved or cleared by the [place]  FDA and has been authorized for detection and/or diagnosis of SARS-CoV-2 by FDA under an Emergency Use Authorization (EUA). This EUA will remain in effect (meaning this test can be used) for the duration of the COVID-19 declaration under Section 564(b)(1) of the Act, 21 U.S.C. section 360bbb-3(b)(1), unless the authorization is terminated or revoked.  Performed at [facility], [address], [place], [place] [address]      Labs: BNP (last 3 results) No results for input(s): BNP in the last 8760 hours. Basic Metabolic Panel: Recent Labs  Lab 04/17/24 0443 04/18/24 0429 04/19/24 1039 04/20/24 0503 04/21/24 0457  NA 129* 128* 131* 133* 132*  K 5.3* 5.3* 4.1 4.2 3.8  CL 94* 92* 93* 94* 93*  CO2 28 27 29 31 30   GLUCOSE 96 91 102* 84 89  BUN 32* 28* 23 18 14   CREATININE 1.63* 1.33* 1.20* 1.03* 0.85  CALCIUM  9.1 9.7 9.6 9.6 9.7   Liver Function Tests: No results for input(s): AST, ALT, ALKPHOS, BILITOT, PROT, ALBUMIN in the last 168 hours. No results for input(s): LIPASE, AMYLASE in the last 168 hours. No results for input(s): AMMONIA in the last 168 hours. CBC: Recent Labs  Lab 04/15/24 1242 04/16/24 0442 04/17/24 0443 04/18/24 0429 04/20/24 0503  WBC 8.2 12.1* 6.7 6.7 5.8  NEUTROABS 6.4  --   --   --  3.7  HGB 11.7* 10.4* 11.0* 11.3* 11.1*  HCT 35.2* 31.2* 33.6* 34.2* 34.7*  MCV 93.1 93.1 95.2 93.7 94.3  PLT 178 179 181 231 268   Cardiac Enzymes: No results for input(s): CKTOTAL, CKMB, CKMBINDEX, TROPONINI in  the last 168 hours. BNP: Invalid input(s): POCBNP CBG: Recent Labs  Lab 04/15/24 2112 04/15/24 2213  GLUCAP 63* 67*   D-Dimer No results for input(s): DDIMER in the last 72 hours. Hgb A1c No results for input(s): HGBA1C  in the last 72 hours. Lipid Profile No results for input(s): CHOL, HDL, LDLCALC, TRIG, CHOLHDL, LDLDIRECT in the last 72 hours. Thyroid function studies No results for input(s): TSH, T4TOTAL, T3FREE, THYROIDAB in the last 72 hours.  Invalid input(s): FREET3 Anemia work up No results for input(s): VITAMINB12, FOLATE, FERRITIN, TIBC, IRON, RETICCTPCT in the last 72 hours. Urinalysis    Component Value Date/Time   COLORURINE YELLOW 04/15/2024 1520   APPEARANCEUR CLEAR 04/15/2024 1520   LABSPEC 1.009 04/15/2024 1520   PHURINE 5.0 04/15/2024 1520   GLUCOSEU NEGATIVE 04/15/2024 1520   HGBUR NEGATIVE 04/15/2024 1520   BILIRUBINUR NEGATIVE 04/15/2024 1520   KETONESUR NEGATIVE 04/15/2024 1520   PROTEINUR NEGATIVE 04/15/2024 1520   NITRITE NEGATIVE 04/15/2024 1520   LEUKOCYTESUR NEGATIVE 04/15/2024 1520   Sepsis Labs Recent Labs  Lab 04/16/24 0442 04/17/24 0443 04/18/24 0429 04/20/24 0503  WBC 12.1* 6.7 6.7 5.8   Microbiology Recent Results (from the past 240 hours)  Resp panel by RT-PCR (RSV, Flu A&B, Covid) Anterior Nasal Swab     Status: None   Collection Time: 04/15/24  9:57 PM   Specimen: Anterior Nasal Swab  Result Value Ref Range Status   SARS Coronavirus 2 by RT PCR NEGATIVE NEGATIVE Final    Comment: (NOTE) SARS-CoV-2 target nucleic acids are NOT DETECTED.  The SARS-CoV-2 RNA is generally detectable in upper respiratory specimens during the acute phase of infection. The lowest concentration of SARS-CoV-2 viral copies this assay can detect is 138 copies/mL. A negative result does not preclude SARS-Cov-2 infection and should not be used as the sole basis for treatment or other patient management decisions. A negative result may occur with  improper specimen collection/handling, submission of specimen other than nasopharyngeal swab, presence of viral mutation(s) within the areas targeted by this assay, and inadequate number of  viral copies(<138 copies/mL). A negative result must be combined with clinical observations, patient history, and epidemiological information. The expected result is Negative.  Fact Sheet for Patients:  bloggercourse.com  Fact Sheet for Healthcare Providers:  seriousbroker.it  This test is no t yet approved or cleared by the [place]  FDA and  has been authorized for detection and/or diagnosis of SARS-CoV-2 by FDA under an Emergency Use Authorization (EUA). This EUA will remain  in effect (meaning this test can be used) for the duration of the COVID-19 declaration under Section 564(b)(1) of the Act, 21 U.S.C.section 360bbb-3(b)(1), unless the authorization is terminated  or revoked sooner.       Influenza A by PCR NEGATIVE NEGATIVE Final   Influenza B by PCR NEGATIVE NEGATIVE Final    Comment: (NOTE) The Xpert Xpress SARS-CoV-2/FLU/RSV plus assay is intended as an aid in the diagnosis of influenza from Nasopharyngeal swab specimens and should not be used as a sole basis for treatment. Nasal washings and aspirates are unacceptable for Xpert Xpress SARS-CoV-2/FLU/RSV testing.  Fact Sheet for Patients: bloggercourse.com  Fact Sheet for Healthcare Providers: seriousbroker.it  This test is not yet approved or cleared by the [place]  FDA and has been authorized for detection and/or diagnosis of SARS-CoV-2 by FDA under an Emergency Use Authorization (EUA). This EUA will remain in effect (meaning this test can be used) for the duration of the COVID-19 declaration under Section  564(b)(1) of the Act, 21 U.S.C. section 360bbb-3(b)(1), unless the authorization is terminated or revoked.     Resp Syncytial Virus by PCR NEGATIVE NEGATIVE Final    Comment: (NOTE) Fact Sheet for Patients: bloggercourse.com  Fact Sheet for Healthcare  Providers: seriousbroker.it  This test is not yet approved or cleared by the [place]  FDA and has been authorized for detection and/or diagnosis of SARS-CoV-2 by FDA under an Emergency Use Authorization (EUA). This EUA will remain in effect (meaning this test can be used) for the duration of the COVID-19 declaration under Section 564(b)(1) of the Act, 21 U.S.C. section 360bbb-3(b)(1), unless the authorization is terminated or revoked.  Performed at [facility], [address] [place], [place] [address]     Time coordinating discharge:  38 mins  SIGNED:  Afton Louder, MD  [facility] 04/21/2024, 9:56 AM How to contact the [facility] Attending or Consulting provider 7A - 7P or covering provider during after hours 7P -7A, for this patient?  Check the care team in [facility] and look for a) attending/consulting [facility] provider listed and b) the [facility] team listed Log into [email or web address] and use [place]'s universal password to access. If you do not have the password, please contact the hospital operator. Locate the [facility] provider you are looking for under [facility] and page to a number that you can be directly reached. If you still have difficulty reaching the provider, please page the [facility] (Director on Call) for the Hospitalists listed on amion for assistance.

## 2024-05-13 ENCOUNTER — Telehealth: Payer: Self-pay | Admitting: Cardiovascular Disease

## 2024-05-13 NOTE — Telephone Encounter (Signed)
 Spoke with patient's daughter Clarita and shared recommendation from Dr. Barbaraann to have labs drawn this week. Clarita reports patient has 2 broken bones in her pelvis and has a difficult time getting out of the house right now.  Clarita instructed me to contact patient regarding home health services.  Spoke with patient -- she is receiving physical therapy at home with [facility]. Called agency at [phone] and spoke with case manager Bernarda: requested one time RN evaluation for lab draw (BMP and Magnesium  level) this week. Bernarda states they will get a nurse out to patient to draw labs and will fax to our office at [phone].  Notified patient of conversation with home health agency and their office arranging for nurse visit to draw labs. Patient verbalized understanding and expressed appreciation for assistance.

## 2024-05-13 NOTE — Telephone Encounter (Signed)
 Pts daughter called regarding pts discharge from hospital on 11/24. Daughter reports she recently realized that pts Lasix  and potassium orders were changed from daily to every other day at discharge, but they did not realize and she had continued taking it daily. Daughter states pt had a follow-up visit with PCP the day after discharge, but no labs have been drawn since discharge. Daughter informed that we will follow up with Dr. Burton for further guidance and that updated labs will likely be needed.

## 2024-05-13 NOTE — Telephone Encounter (Signed)
 Pt daughter was in the hospital and they lowered her potassium to every other day and her Lasix  to 10mg  every other day. Please advise.

## 2024-05-28 ENCOUNTER — Ambulatory Visit: Payer: Self-pay

## 2024-05-28 NOTE — Telephone Encounter (Signed)
 FYI Only or Action Required?: Action required by provider: update on patient condition.  Patient was last seen in primary care on new patient.  Called Nurse Triage reporting Hip Pain.  Symptoms began a week ago.  Interventions attempted: Rest, hydration, or home remedies.  Symptoms are: gradually worsening.  Triage Disposition: See PCP When Office is Open (Within 3 Days)  Patient/caregiver understands and will follow disposition?: Yes  Copied from CRM #[id number]. Topic: Clinical - Red Word Triage >> May 28, 2024 10:50 AM Kristen Barry wrote: Kristen Barry that prompted transfer to Nurse Triage: Patients daughter, Kristen Barry, states that the patient fell and broke her right side of the her pelvic bone, but is having extreme pain on her left side now,worried it may be fractured as wel Reason for Disposition  [1] MODERATE pain (e.g., interferes with normal activities, limping) AND [2] present > 3 days  Answer Assessment - Initial Assessment Questions Left buttock/hip pain  History of right pelvic fracture with no known injury.  Has recently began having left buttock pain.  Patient already has appt scheduled to establish care.  Recommend UC if pain worsens or does not resolve  Verbalizes understanding  Transferred to CAL, Priscilla at [facility] for assistance in scheduling  Protocols used: Hip Pain-A-AH

## 2024-05-29 ENCOUNTER — Emergency Department (HOSPITAL_COMMUNITY)

## 2024-05-29 ENCOUNTER — Other Ambulatory Visit: Payer: Self-pay

## 2024-05-29 ENCOUNTER — Encounter (HOSPITAL_COMMUNITY): Payer: Self-pay

## 2024-05-29 ENCOUNTER — Emergency Department (HOSPITAL_COMMUNITY)
Admission: EM | Admit: 2024-05-29 | Discharge: 2024-05-29 | Disposition: A | Discharge status: Home / Self Care | Attending: Emergency Medicine | Admitting: Emergency Medicine

## 2024-05-29 DIAGNOSIS — I110 Hypertensive heart disease with heart failure: Secondary | ICD-10-CM | POA: Insufficient documentation

## 2024-05-29 DIAGNOSIS — Z8673 Personal history of transient ischemic attack (TIA), and cerebral infarction without residual deficits: Secondary | ICD-10-CM | POA: Diagnosis not present

## 2024-05-29 DIAGNOSIS — Z79899 Other long term (current) drug therapy: Secondary | ICD-10-CM | POA: Insufficient documentation

## 2024-05-29 DIAGNOSIS — M25552 Pain in left hip: Secondary | ICD-10-CM | POA: Diagnosis present

## 2024-05-29 DIAGNOSIS — I509 Heart failure, unspecified: Secondary | ICD-10-CM | POA: Insufficient documentation

## 2024-05-29 DIAGNOSIS — Z7902 Long term (current) use of antithrombotics/antiplatelets: Secondary | ICD-10-CM | POA: Diagnosis not present

## 2024-05-29 DIAGNOSIS — M7602 Gluteal tendinitis, left hip: Secondary | ICD-10-CM | POA: Insufficient documentation

## 2024-05-29 DIAGNOSIS — M7918 Myalgia, other site: Secondary | ICD-10-CM

## 2024-05-29 LAB — I-STAT CHEM 8, ED
BUN: 16 mg/dL (ref 8–23)
Calcium, Ion: 1.15 mmol/L (ref 1.15–1.40)
Chloride: 101 mmol/L (ref 98–111)
Creatinine, Ser: 1 mg/dL (ref 0.44–1.00)
Glucose, Bld: 85 mg/dL (ref 70–99)
HCT: 40 % (ref 36.0–46.0)
Hemoglobin: 13.6 g/dL (ref 12.0–15.0)
Potassium: 5.1 mmol/L (ref 3.5–5.1)
Sodium: 136 mmol/L (ref 135–145)
TCO2: 28 mmol/L (ref 22–32)

## 2024-05-29 MED ORDER — LIDOCAINE HCL (PF) 1 % IJ SOLN
5.0000 mL | Freq: Once | INTRAMUSCULAR | Status: AC
  Administered 2024-05-29: 5 mL
  Filled 2024-05-29: qty 5

## 2024-05-29 MED ORDER — KETOROLAC TROMETHAMINE 30 MG/ML IJ SOLN
15.0000 mg | Freq: Once | INTRAMUSCULAR | Status: AC
  Administered 2024-05-29: 15 mg via INTRAVENOUS
  Filled 2024-05-29: qty 1

## 2024-05-29 MED ORDER — LIDOCAINE 5 % EX PTCH
1.0000 | MEDICATED_PATCH | CUTANEOUS | Status: DC
  Administered 2024-05-29: 1 via TRANSDERMAL
  Filled 2024-05-29: qty 1

## 2024-05-29 MED ORDER — CELECOXIB 100 MG PO CAPS: 100.0000 mg | ORAL_CAPSULE | Freq: Every day | ORAL | 0 refills | Status: DC | PRN

## 2024-05-29 NOTE — ED Triage Notes (Signed)
 Pt was here back in November 2025 for a fall and had pelvic fractures that she was told had to heal on it's own. After d/c she did physical therapy at home and still is 1 day/week. Pt states a week ago she got a pain above her sacral area over her left butt area. States 8/10 with no pressure and with pressure like sitting it increases drastically. Hx of HTN, and takes plavix  daily. Not diabetic. VSS per EMS but BP increased in ED.

## 2024-05-29 NOTE — ED Provider Notes (Signed)
 " [facility] AT [facility] Provider Note   CSN: [id number] Arrival date & time: 05/29/24  1340     Patient presents with: left hip / sacral pain   Kristen Barry is a 84 y.o. female with a past medical history of hypertension, heart failure, mild to moderate OA, CVA in 2017 on Plavix  was admitted in November 2025 after a fall with right pelvic fracture.  She has been to rehab and has been home doing well doing physical therapy resupported 2 days a day she is doing bilateral leg lifts which she felt was twinge in her left buttock near the border of her tailbone.)-Significant pain in that area especially when she tries to sleep.  It is worse with walking and ambulation but better when she is lying down.  She reports that yesterday she was able to get around with her walker, but today the pain was worse.  Her daughter is at bedside states that she was unable to get the patient out of bed by herself today so they came in here for for further evaluation.  She is on oxycodone  for pain control currently.  She reports that this has not been helpful.  They have tried Biofreeze, ice and a heating pad without resolution of the discomfort she is having.  She was told by her primary care doctor not to take NSAIDs due to her kidney function.    HPI     Prior to Admission medications  Medication Sig Start Date End Date Taking? Authorizing Provider  acetaminophen  (TYLENOL ) 500 MG tablet Take 1,000 mg by mouth in the morning and at bedtime.    [provider]  amLODipine  (NORVASC ) 5 MG tablet Take 1 tablet (5 mg total) by mouth daily. Patient taking differently: Take 5 mg by mouth at bedtime. 02/12/24 05/12/24  Lucien Orren SAILOR, PA-C  celecoxib  (CELEBREX ) 100 MG capsule Take 1 capsule (100 mg total) by mouth daily as needed for moderate pain (pain score 4-6). 04/21/24   Johnson, Clanford L, MD  cholecalciferol  (VITAMIN D ) 1000 UNITS tablet Take 1,000 Units by mouth daily.     [provider]  clopidogrel  (PLAVIX ) 75 MG tablet Take 1 tablet (75 mg total) by mouth daily. 03/02/16   Krishnan, Sendil K, MD  cyclobenzaprine  (FLEXERIL ) 5 MG tablet Take 5 mg by mouth 3 (three) times daily as needed for muscle spasms. 04/14/24   [provider]  ezetimibe  (ZETIA ) 10 MG tablet Take 10 mg by mouth daily. 07/20/22   [provider]  furosemide  (LASIX ) 20 MG tablet Take 1 tablet (20 mg total) by mouth every other day. 04/22/24 07/21/24  Vicci Afton CROME, MD  gabapentin  (NEURONTIN ) 600 MG tablet Take 600-1,200 mg by mouth 2 (two) times daily. 600 mg am, 1200 mg pm    [provider]  losartan  (COZAAR ) 100 MG tablet Take 100 mg by mouth daily.    [provider]  magnesium  gluconate (MAGONATE) 500 (27 Mg) MG TABS tablet Take 500 mg by mouth daily.    [provider]  meclizine  (ANTIVERT ) 25 MG tablet Take 25 mg by mouth as needed for dizziness.    [provider]  metoprolol  succinate (TOPROL -XL) 100 MG 24 hr tablet Take 100 mg by mouth daily. Take with or immediately following a meal.    [provider]  Multiple Vitamin (MULTIVITAMIN) tablet Take 1 tablet by mouth daily.    [provider]  Omega-3 Fatty Acids (FISH OIL)  1000 MG CAPS Take 1 capsule by mouth in the morning and at bedtime.    [provider]  ondansetron  (ZOFRAN -ODT) 4 MG disintegrating tablet Take 4 mg by mouth every 8 (eight) hours as needed for nausea or vomiting.    [provider]  oxyCODONE  (OXY IR/ROXICODONE ) 5 MG immediate release tablet Take 1 tablet (5 mg total) by mouth every 8 (eight) hours as needed for severe pain (pain score 7-10). 04/21/24   Johnson, Clanford L, MD  pantoprazole  (PROTONIX ) 40 MG tablet Take 1 tablet (40 mg total) by mouth every evening. 04/21/24   Johnson, Clanford L, MD  polyethylene glycol (MIRALAX  / GLYCOLAX ) 17 g packet Take 17 g by mouth daily. 04/21/24   Johnson, Clanford L, MD   potassium chloride  SA (KLOR-CON  M) 10 MEQ tablet Take 1 tablet (10 mEq total) by mouth every other day. 04/22/24   Johnson, Clanford L, MD  vitamin B-12 (CYANOCOBALAMIN) 500 MCG tablet Take 1,000 mcg by mouth daily.    [provider]    Allergies: Ace inhibitors and Codeine    Review of Systems  Updated Vital Signs BP (!) 147/67 (BP Location: Left Arm)   Pulse 64   Temp 98.6 F (37 C) (Oral)   Resp 20   Ht 5' 3 (1.6 m)   Wt 91.2 kg   SpO2 94%   BMI 35.61 kg/m   Physical Exam Vitals and nursing note reviewed.  Constitutional:      General: She is not in acute distress.    Appearance: She is well-developed. She is not diaphoretic.  HENT:     Head: Normocephalic and atraumatic.     Right Ear: External ear normal.     Left Ear: External ear normal.     Nose: Nose normal.     Mouth/Throat:     Mouth: Mucous membranes are moist.  Eyes:     General: No scleral icterus.    Conjunctiva/sclera: Conjunctivae normal.  Cardiovascular:     Rate and Rhythm: Normal rate and regular rhythm.     Heart sounds: Normal heart sounds. No murmur heard.    No friction rub. No gallop.  Pulmonary:     Effort: Pulmonary effort is normal. No respiratory distress.     Breath sounds: Normal breath sounds.  Abdominal:     General: Bowel sounds are normal. There is no distension.     Palpations: Abdomen is soft. There is no mass.     Tenderness: There is no abdominal tenderness. There is no guarding.  Musculoskeletal:     Cervical back: Normal range of motion.     Comments: Patient examined in recumbent supine position on the examining bed. Unable to rule a straight leg off the table on the left side.  She is weakly able to weakly raise the right side. She has pain with resisted abduction on the left but none on the right, no pain with resisted adduction of the hips.  No obvious swelling, bruising or signs of infection along the gluteal cleft.  Tenderness along the inferior gluteal  muscles in the region of the obturator.  Skin:    General: Skin is warm and dry.  Neurological:     Mental Status: She is alert and oriented to person, place, and time.  Psychiatric:        Behavior: Behavior normal.     (all labs ordered are listed, but only abnormal results are displayed) Labs Reviewed - No data to display  EKG:  None  Radiology: No results found.   Procedures   Medications Ordered in the ED - No data to display  Clinical Course as of 05/29/24 1703  Thu May 29, 2024  1556 Recommend coccyx x-ray and a left hip x-ray.  No acute findings noted. [AH]    Clinical Course User Index [AH] Arloa Chroman, PA-C                                 Medical Decision Making Amount and/or Complexity of Data Reviewed Radiology: ordered.  Risk Prescription drug management.   Patient presents with left gluteal pain after abduction.  Physical therapy. On exam she has no evidence of weakness, [place] breakdown or other cause of her pain. I ordered visualized and interpreted a left hip and sacrum and coccyx x-ray.  There are no acute findings. I ordered  a chem 8 to review the patient's creatinine level which is normal.  Patient is already taking oxycodone .  Will add 100 mg Celebrex , she may use over-the-counter lidocaine  patches, Tylenol , ice.  She appears otherwise appropriate for discharge at this time without acute finding close follow-up with PCP.     Final diagnoses:  None    ED Discharge Orders     None          Arloa Chroman, PA-C 05/29/24 1707    Yolande Lamar BROCKS, MD 06/06/24 1002  "

## 2024-05-29 NOTE — Discharge Instructions (Signed)
 Your kidney function was normal therefor you can take an antiinflammatory medication along with tylenol .  ### Left Gluteal Muscle Strain: Home Care Instructions     ## What is a Gluteal Muscle Strain?        You have been diagnosed with a strain (stretch or tear) of the gluteal muscles on your left side. These are the muscles in your buttock area that help you walk, climb stairs, and stand from a sitting position. This injury occurred during physical therapy and should heal well with proper care.      ## Your Medication: Celecoxib  (Celebrex )        You have been prescribed celecoxib  to help reduce pain and inflammation. Take this medication as directed by your doctor.      **Important safety information:**      - Take with food or milk to reduce stomach upset      - Use the lowest dose that controls your pain for the shortest time needed      - Do not take if you are allergic to sulfa drugs, aspirin , or other anti-inflammatory medications      **Stop taking celecoxib  and call your doctor immediately if you experience:**      - Black, tarry, or bloody stools      - Vomiting blood or material that looks like coffee grounds      - Severe stomach pain      - Yellowing of your skin or eyes      - Unusual fatigue, nausea, or flu-like symptoms      - Rash, blisters, or fever      **Seek emergency care immediately if you develop:**      - Chest pain or shortness of breath      - Weakness on one side of your body or slurred speech      - Sudden swelling of your face, lips, tongue, or throat      - Difficulty breathing or wheezing      ## Home Care Instructions        **Rest and Activity Modification:**      - Avoid activities that cause pain, especially running, jumping, or climbing stairs      - You may continue gentle daily activities as tolerated      - Do not sit for prolonged periods on hard surfaces      - Gradually increase activity as pain improves       **Ice Application:**      - Apply ice to the injured area for 20 minutes at a time, 3-4 times daily      - Use a bag of ice and water wrapped in a damp towel (most effective method)      - Never place ice directly on your skin      - Continue icing for the first 48-72 hours or as long as you have swelling      **Compression:**      - You may use a compression wrap for comfort if recommended      - Make sure the wrap is snug but not too tight      - Remove the wrap if you notice numbness, tingling, or increased pain      **Gentle Stretching and Strengthening:**      - Begin gentle stretching exercises as pain allows      - Follow the exercise program provided by your physical therapist      -  Gradually progress your exercises as symptoms improve      - Do not push through significant pain      ## Expected Recovery        Most muscle strains heal well with proper care. You should notice gradual improvement over 2-4 weeks. Continue to follow up with your physical therapist as scheduled to ensure proper healing and prevent re-injury.      ## When to Seek Medical Attention        **Call your doctor if you experience:**      - Pain that worsens despite rest and medication      - Increasing swelling, bruising, or warmth in the area      - Numbness, tingling, or weakness in your leg      - Inability to bear weight or walk      - No improvement after 1-2 weeks of home care      - Fever or signs of infection      **Go to the emergency department if you develop:**      - Severe, sudden pain in your hip or leg      - Complete inability to move your leg      - Signs of blood clots: calf pain, swelling, warmth, or redness      - Any of the serious medication side effects listed above      ## Follow-Up        Continue your scheduled physical therapy appointments. Contact your doctor if you have questions or concerns about your recovery.

## 2024-06-03 ENCOUNTER — Other Ambulatory Visit: Payer: Self-pay

## 2024-06-06 ENCOUNTER — Encounter: Payer: Self-pay | Admitting: Student in an Organized Health Care Education/Training Program

## 2024-06-06 ENCOUNTER — Ambulatory Visit: Admitting: Student in an Organized Health Care Education/Training Program

## 2024-06-06 VITALS — BP 136/70 | HR 70 | Wt 202.0 lb

## 2024-06-06 DIAGNOSIS — L89152 Pressure ulcer of sacral region, stage 2: Secondary | ICD-10-CM | POA: Diagnosis not present

## 2024-06-06 DIAGNOSIS — M810 Age-related osteoporosis without current pathological fracture: Secondary | ICD-10-CM | POA: Insufficient documentation

## 2024-06-06 DIAGNOSIS — S32591G Other specified fracture of right pubis, subsequent encounter for fracture with delayed healing: Secondary | ICD-10-CM

## 2024-06-06 DIAGNOSIS — S32599A Other specified fracture of unspecified pubis, initial encounter for closed fracture: Secondary | ICD-10-CM | POA: Insufficient documentation

## 2024-06-06 DIAGNOSIS — I5032 Chronic diastolic (congestive) heart failure: Secondary | ICD-10-CM | POA: Insufficient documentation

## 2024-06-06 DIAGNOSIS — R5381 Other malaise: Secondary | ICD-10-CM | POA: Diagnosis not present

## 2024-06-06 DIAGNOSIS — M8000XA Age-related osteoporosis with current pathological fracture, unspecified site, initial encounter for fracture: Secondary | ICD-10-CM

## 2024-06-06 LAB — CBC WITH DIFFERENTIAL/PLATELET
Basophils Absolute: 0.1 K/uL (ref 0.0–0.1)
Basophils Relative: 0.8 % (ref 0.0–3.0)
Eosinophils Absolute: 0.1 K/uL (ref 0.0–0.7)
Eosinophils Relative: 1.5 % (ref 0.0–5.0)
HCT: 39.2 % (ref 36.0–46.0)
Hemoglobin: 12.9 g/dL (ref 12.0–15.0)
Lymphocytes Relative: 13.2 % (ref 12.0–46.0)
Lymphs Abs: 1 K/uL (ref 0.7–4.0)
MCHC: 33 g/dL (ref 30.0–36.0)
MCV: 93 fl (ref 78.0–100.0)
Monocytes Absolute: 0.6 K/uL (ref 0.1–1.0)
Monocytes Relative: 8.4 % (ref 3.0–12.0)
Neutro Abs: 5.5 K/uL (ref 1.4–7.7)
Neutrophils Relative %: 76.1 % (ref 43.0–77.0)
Platelets: 280 K/uL (ref 150.0–400.0)
RBC: 4.22 Mil/uL (ref 3.87–5.11)
RDW: 14.5 % (ref 11.5–15.5)
WBC: 7.2 K/uL (ref 4.0–10.5)

## 2024-06-06 LAB — HEMOGLOBIN A1C: Hgb A1c MFr Bld: 5.1 % (ref 4.6–6.5)

## 2024-06-06 LAB — BASIC METABOLIC PANEL WITH GFR
BUN: 16 mg/dL (ref 6–23)
CO2: 33 meq/L — ABNORMAL HIGH (ref 19–32)
Calcium: 9.9 mg/dL (ref 8.4–10.5)
Chloride: 102 meq/L (ref 96–112)
Creatinine, Ser: 0.98 mg/dL (ref 0.40–1.20)
GFR: 53.24 mL/min — ABNORMAL LOW
Glucose, Bld: 102 mg/dL — ABNORMAL HIGH (ref 70–99)
Potassium: 4.3 meq/L (ref 3.5–5.1)
Sodium: 141 meq/L (ref 135–145)

## 2024-06-06 LAB — MAGNESIUM: Magnesium: 2.2 mg/dL (ref 1.5–2.5)

## 2024-06-06 LAB — VITAMIN D 25 HYDROXY (VIT D DEFICIENCY, FRACTURES): VITD: 53 ng/mL (ref 30.00–100.00)

## 2024-06-06 NOTE — Assessment & Plan Note (Signed)
 Osteoporosis is suspected due to a fragility fracture with no prior treatment. Plan osteoporosis treatment post-fracture healing.

## 2024-06-06 NOTE — Assessment & Plan Note (Signed)
 Difficult situation of worsening physical deconditioning since discharge.  The patient was discharged to a skilled nursing facility, but upon seeing the facility she decided to go home instead.  She has been doing home health physical therapy and cared for 24 hours a day by her family.  Remains very deconditioned, difficult for her to stand out of the wheelchair, needs one-person assistance with all activities and transfers.  I recommended continuing with physical therapy.  I am worried about contributions of polypharmacy to her declining condition, currently using multiple centrally acting medications.  I recommended discontinuing the use of Flexeril , oxycodone , meclizine .  Okay to continue gabapentin  but we should try to work down on the dose in the future.

## 2024-06-06 NOTE — Assessment & Plan Note (Signed)
 The pelvic fracture from a November fall is healing with no new fractures. Pain management is challenging, contributing to limited mobility and pressure ulcer risk. Continue physical therapy for mobility and strength. Discuss offloading techniques with the physical therapist. Encourage standing and walking for recovery.  This fall and fractures have taken a significant toll on her functional status.

## 2024-06-06 NOTE — Assessment & Plan Note (Signed)
 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 Applied a cushioning bandage for protection. Arranged home health nursing for wound care and monitoring. Reposition every two hours to offload pressure. Encouraged mobility with standing and walking using a walker. Avoid ice and heat on the ulcer. Ensure proper nutrition and hydration.  I does not look infected right now on exam, will check a white blood cell counts.  Pain is a little out of proportion to exam, will monitor this carefully, will reevaluate the wound in office in 2 weeks.  Given her degree of deconditioning, she is at risk for worsening wound breakdown, I spent about 15 minutes talking with her family about the importance of repositioning and risks of a bad outcome if this wound were to worsen.

## 2024-06-06 NOTE — Assessment & Plan Note (Signed)
 Chronic and stable.  Looks euvolemic today on exam.  Very poor functioning at this point, but not much limitation from dyspnea.  Seems like NYHA class II symptoms.  Will check renal function today.  Continue with Lasix  20 mg every other day, managing blood pressure with losartan  and amlodipine .

## 2024-06-06 NOTE — Progress Notes (Signed)
 "  New Patient Office Visit  Patient ID: Kristen Barry, Female   DOB: 11-14-1940 84 y.o. MRN: [id number]  Chief Complaint  Patient presents with   Establish Care    Just came out of hospital a month ago due to 2 fractures in pevlic area was getting better 2 weeks ago left side pain and was getting to the point she was not able to get out bed. Was seen in ER nothing broken.  Check vitamin d  magnesium  and bone density    Subjective:     Kristen Barry presents to establish care  HPI  Discussed the use of AI scribe software for clinical note transcription with the patient, who gave verbal consent to proceed.  History of Present Illness Kristen Barry is an 84 year old female with a history of pelvic fractures who presents with severe left-sided pain. She is accompanied by her daughter, Kristen Barry.  She has been experiencing severe left-sided pain for approximately two weeks. Initially, she had a fracture on the right side of her pelvis, which was healing, but then developed pain on the left side. An ER visit confirmed no new fractures on the left side, and the pain was treated as a muscle strain. Despite treatment with increased gabapentin , muscle relaxers, and oxycodone , her pain has worsened over the past week. The pain is severe, especially when getting out of bed or sitting on the toilet, significantly impacting her mobility.  In November, she was hospitalized with pneumonia, which led to a fall resulting in pelvic fractures. She did not undergo surgery for these fractures. She attempted a stay at a rehab center but left after a few hours due to discomfort. Since then, she has been receiving home health physical therapy from [facility]. She was performing exercises but experienced a sudden onset of pain after attempting exercises independently.  She uses a walker for mobility and can walk from her living room to her car, but her overall mobility has decreased significantly since the fall.  Before the fall, she was able to drive and go to the grocery store independently. Her breathing has improved, with resolved cough and shortness of breath.  She has a history of a stroke in 2017, for which she was hospitalized and underwent rehabilitation. She manages her medications independently and assists her husband with his medications.  She has a supportive family network, with granddaughters who work from home and assist during the day, and her son and daughter-in-law who stay overnight. She has been experiencing skin breakdown, with a sore on her backside that was recently discovered by her daughter-in-law. The sore appeared after prolonged immobility and use of Biofreeze and lidocaine  patches.   Outpatient Encounter Medications as of 06/06/2024  Medication Sig   clopidogrel  (PLAVIX ) 75 MG tablet Take 1 tablet (75 mg total) by mouth daily.   ezetimibe  (ZETIA ) 10 MG tablet Take 10 mg by mouth daily.   furosemide  (LASIX ) 20 MG tablet Take 1 tablet (20 mg total) by mouth every other day.   gabapentin  (NEURONTIN ) 600 MG tablet Take 600-1,200 mg by mouth 2 (two) times daily. 600 mg am, 1200 mg pm   losartan  (COZAAR ) 100 MG tablet Take 100 mg by mouth daily.   metoprolol  succinate (TOPROL -XL) 100 MG 24 hr tablet Take 100 mg by mouth daily. Take with or immediately following a meal.   omeprazole (PRILOSEC) 40 MG capsule Take 40 mg by mouth daily.   [DISCONTINUED] acetaminophen  (TYLENOL ) 500 MG tablet Take 1,000 mg  by mouth in the morning and at bedtime.   [DISCONTINUED] amLODipine  (NORVASC ) 2.5 MG tablet Take 2.5 mg by mouth daily.   [DISCONTINUED] celecoxib  (CELEBREX ) 100 MG capsule Take 1 capsule (100 mg total) by mouth daily as needed.   [DISCONTINUED] cholecalciferol  (VITAMIN D ) 1000 UNITS tablet Take 1,000 Units by mouth daily.   [DISCONTINUED] magnesium  gluconate (MAGONATE) 500 (27 Mg) MG TABS tablet Take 500 mg by mouth daily.   [DISCONTINUED] Multiple Vitamin (MULTIVITAMIN) tablet Take 1  tablet by mouth daily.   [DISCONTINUED] Omega-3 Fatty Acids (FISH OIL) 1000 MG CAPS Take 1 capsule by mouth in the morning and at bedtime.   [DISCONTINUED] ondansetron  (ZOFRAN -ODT) 4 MG disintegrating tablet Take 4 mg by mouth every 8 (eight) hours as needed for nausea or vomiting.   [DISCONTINUED] oxyCODONE  (OXY IR/ROXICODONE ) 5 MG immediate release tablet Take 1 tablet (5 mg total) by mouth every 8 (eight) hours as needed for severe pain (pain score 7-10).   [DISCONTINUED] potassium chloride  SA (KLOR-CON  M) 10 MEQ tablet Take 1 tablet (10 mEq total) by mouth every other day.   amLODipine  (NORVASC ) 5 MG tablet Take 1 tablet (5 mg total) by mouth daily. (Patient taking differently: Take 5 mg by mouth at bedtime.)   [DISCONTINUED] cyclobenzaprine  (FLEXERIL ) 5 MG tablet Take 5 mg by mouth 3 (three) times daily as needed for muscle spasms.   [DISCONTINUED] meclizine  (ANTIVERT ) 25 MG tablet Take 25 mg by mouth as needed for dizziness.   [DISCONTINUED] pantoprazole  (PROTONIX ) 40 MG tablet Take 1 tablet (40 mg total) by mouth every evening.   [DISCONTINUED] polyethylene glycol (MIRALAX  / GLYCOLAX ) 17 g packet Take 17 g by mouth daily.   [DISCONTINUED] vitamin B-12 (CYANOCOBALAMIN) 500 MCG tablet Take 1,000 mcg by mouth daily. (Patient not taking: Reported on 06/06/2024)   No facility-administered encounter medications on file as of 06/06/2024.    Past Medical History:  Diagnosis Date   Abnormality of gait 04/24/2016   Carpal tunnel syndrome    Cerebral artery occlusion with cerebral infarction [facility])    CVA (cerebral infarction) 02/25/2016   Diverticulosis    Environmental allergies    GERD (gastroesophageal reflux disease)    H/O Bell's palsy    Hypertension    Insomnia    Laryngopharyngeal reflux    Numbness in right leg    OA (osteoarthritis)    Obesity 02/25/2016   Osteoarthritis    Osteoarthrosis    Osteopenia    PAD (peripheral artery disease)    Paresthesia of skin    Right sided  weakness    Seasonal allergies    Slurred speech 02/25/2016   SOB (shortness of breath)    Stroke [facility])     Past Surgical History:  Procedure Laterality Date   ABDOMINAL HYSTERECTOMY     EYE SURGERY      Family History  Problem Relation Age of Onset   CVA Mother    Diabetes Mother    Osteoarthritis Mother    Hypertension Mother    CAD Father    Heart Problems Brother        stent   Heart attack Brother        2-3 caths   Melanoma Paternal Aunt     Social History   Socioeconomic History   Marital status: Married    Spouse name: Not on file   Number of children: 3   Years of education: Not on file   Highest education level: Not on file  Occupational History  Occupation: Caregiver  Tobacco Use   Smoking status: Former   Smokeless tobacco: Never   Tobacco comments:    stop smoking when 84 years old   Substance and Sexual Activity   Alcohol use: No   Drug use: No   Sexual activity: Not on file  Other Topics Concern   Not on file  Social History Narrative   Lives with husband   Social Drivers of Health   Tobacco Use: Medium Risk (06/06/2024)   Patient History    Smoking Tobacco Use: Former    Smokeless Tobacco Use: Never    Passive Exposure: Not on Actuary Strain: Not on file  Food Insecurity: No Food Insecurity (04/15/2024)   Epic    Worried About Programme Researcher, Broadcasting/film/video in the Last Year: Never true    Ran Out of Food in the Last Year: Never true  Transportation Needs: No Transportation Needs (04/15/2024)   Epic    Lack of Transportation (Medical): No    Lack of Transportation (Non-Medical): No  Physical Activity: Not on file  Stress: Not on file  Social Connections: Moderately Integrated (04/15/2024)   Social Connection and Isolation Panel    Frequency of Communication with Friends and Family: More than three times a week    Frequency of Social Gatherings with Friends and Family: More than three times a week    Attends Religious  Services: More than 4 times per year    Active Member of [employer] or Organizations: No    Attends Banker Meetings: Never    Marital Status: Married  Catering Manager Violence: Not At Risk (04/15/2024)   Epic    Fear of Current or Ex-Partner: No    Emotionally Abused: No    Physically Abused: No    Sexually Abused: No  Depression (PHQ2-9): Not on file  Alcohol Screen: Not on file  Housing: Low Risk (04/15/2024)   Epic    Unable to Pay for Housing in the Last Year: No    Number of Times Moved in the Last Year: 0    Homeless in the Last Year: No  Utilities: Not At Risk (04/15/2024)   Epic    Threatened with loss of utilities: No  Health Literacy: Not on file      Objective:    BP 136/70   Pulse 70   Wt 202 lb (91.6 kg)   SpO2 97%   BMI 35.78 kg/m   Physical Exam  Gen: Mildly frail appearing older woman, in a wheelchair Neck: Normal thyroid, no nodules or adenopathy Heart: Regular, 3 out of 6 early stock murmur best heard at the right upper sternal border Lungs: Unlabored, clear throughout Abd: Soft, nontender Ext: Warm, trace edema in both lower extremities Skin: On her left gluteal fold there is a tender 5 cm area of erythema consistent with a pressure wound.  There is a 1 cm ulceration to the area of the dermis.  There is minimal drainage, no purulence, no slough.  It is tender to palpation.     Assessment & Plan:   Problem List Items Addressed This Visit       High   Pressure ulcer of sacral region, stage 2 (HCC) - Primary (Chronic)   The stage 2 sacral pressure ulcer is due to immobility and pressure, with a risk of infection and further breakdown, causing significant pain from soft tissue damage. Applied a cushioning bandage for protection. Arranged home health nursing for wound care and  monitoring. Reposition every two hours to offload pressure. Encouraged mobility with standing and walking using a walker. Avoid ice and heat on the ulcer. Ensure  proper nutrition and hydration.  I does not look infected right now on exam, will check a white blood cell counts.  Pain is a little out of proportion to exam, will monitor this carefully, will reevaluate the wound in office in 2 weeks.  Given her degree of deconditioning, she is at risk for worsening wound breakdown, I spent about 15 minutes talking with her family about the importance of repositioning and risks of a bad outcome if this wound were to worsen.      Relevant Orders   Hemoglobin A1c   CBC with Differential/Platelet   Ambulatory referral to Home Health   Pubic ramus fracture (HCC) (Chronic)   The pelvic fracture from a November fall is healing with no new fractures. Pain management is challenging, contributing to limited mobility and pressure ulcer risk. Continue physical therapy for mobility and strength. Discuss offloading techniques with the physical therapist. Encourage standing and walking for recovery.  This fall and fractures have taken a significant toll on her functional status.      Physical deconditioning (Chronic)   Difficult situation of worsening physical deconditioning since discharge.  The patient was discharged to a skilled nursing facility, but upon seeing the facility she decided to go home instead.  She has been doing home health physical therapy and cared for 24 hours a day by her family.  Remains very deconditioned, difficult for her to stand out of the wheelchair, needs one-person assistance with all activities and transfers.  I recommended continuing with physical therapy.  I am worried about contributions of polypharmacy to her declining condition, currently using multiple centrally acting medications.  I recommended discontinuing the use of Flexeril , oxycodone , meclizine .  Okay to continue gabapentin  but we should try to work down on the dose in the future.        Medium    Chronic heart failure with preserved ejection fraction (HFpEF) (HCC) (Chronic)    Chronic and stable.  Looks euvolemic today on exam.  Very poor functioning at this point, but not much limitation from dyspnea.  Seems like NYHA class II symptoms.  Will check renal function today.  Continue with Lasix  20 mg every other day, managing blood pressure with losartan  and amlodipine .       Relevant Orders   Basic metabolic panel with GFR   Magnesium    Osteoporosis (Chronic)   Osteoporosis is suspected due to a fragility fracture with no prior treatment. Plan osteoporosis treatment post-fracture healing.      Relevant Orders   VITAMIN D  25 Hydroxy (Vit-D Deficiency, Fractures)   Return in about 2 weeks (around 06/20/2024).   Kristen Debby Specking, MD [facility]     "

## 2024-06-06 NOTE — Patient Instructions (Signed)
" °  VISIT SUMMARY: During your visit, we discussed your severe left-sided pain, which has been ongoing for two weeks. We also addressed your healing pelvic fracture, stage 3 pressure ulcer, and polyneuropathy. We reviewed your general health maintenance, including checking magnesium  and vitamin D  levels.  YOUR PLAN: -PRESSURE ULCER OF SACRAL REGION, STAGE 3: A stage 3 pressure ulcer is a severe sore caused by prolonged pressure, leading to significant skin and tissue damage. We applied a cushioning bandage to protect the area and arranged for home health nursing to assist with wound care and monitoring. It is important to reposition yourself every two hours to relieve pressure, and to stay mobile by standing and walking with your walker. Avoid using ice and heat on the ulcer, and ensure you maintain proper nutrition and hydration.  -HEALING PELVIC FRACTURE: Your pelvic fracture from the fall in November is healing, and there are no new fractures. Pain management remains a challenge, which affects your mobility and increases the risk of pressure ulcers. Continue with physical therapy to improve your mobility and strength, and discuss techniques to relieve pressure with your therapist. Standing and walking are encouraged to aid in your recovery.  -OSTEOPOROSIS WITH CURRENT FRAGILITY FRACTURE: Osteoporosis is a condition where bones become weak and fragile, leading to fractures. We suspect you have osteoporosis due to your recent fracture and will plan treatment once your fracture has healed.  -POLYNEUROPATHY: Polyneuropathy is a condition that affects multiple nerves, causing pain and weakness. Despite increasing your gabapentin  dosage, your symptoms have not improved. We will continue to monitor and adjust your treatment as needed.  -GENERAL HEALTH MAINTENANCE: We discussed the importance of checking your magnesium  and vitamin D  levels, as these are essential for bone health. We will defer your bone density  screening until after your recovery. Please ensure you follow up on these tests.  INSTRUCTIONS: Please follow up with home health nursing for wound care and monitoring of your pressure ulcer. Reposition yourself every two hours and stay mobile with your walker. Continue physical therapy for your pelvic fracture and discuss offloading techniques with your therapist. Ensure you check your magnesium  and vitamin D  levels as discussed. We will schedule a bone density screening after your recovery.  "

## 2024-06-09 ENCOUNTER — Encounter: Payer: Self-pay | Admitting: Student in an Organized Health Care Education/Training Program

## 2024-06-09 ENCOUNTER — Ambulatory Visit: Admitting: Student in an Organized Health Care Education/Training Program

## 2024-06-09 ENCOUNTER — Ambulatory Visit: Payer: Self-pay | Admitting: Student in an Organized Health Care Education/Training Program

## 2024-06-11 ENCOUNTER — Telehealth: Payer: Self-pay

## 2024-06-11 NOTE — Telephone Encounter (Signed)
 Copied from CRM [id number]. Topic: Referral - Status >> Jun 11, 2024  2:57 PM Charolett L wrote: Reason for CRM: Patient calling for the status of the referral for wound care.   Any update on this so I can inform patient?

## 2024-06-11 NOTE — Telephone Encounter (Signed)
 Copied from CRM [id number]. Topic: Referral - Status >> Jun 11, 2024  2:57 PM Charolett L wrote: Reason for CRM: Patient calling for the status of the referral for wound care

## 2024-06-11 NOTE — Telephone Encounter (Signed)
 Patients referral states she needs to be seen within 2-3 days I do not see documentation that his referral has been processed to [facility] please advise

## 2024-06-12 ENCOUNTER — Encounter: Payer: Self-pay | Admitting: Student in an Organized Health Care Education/Training Program

## 2024-06-12 NOTE — Telephone Encounter (Signed)
 [facility] cannot get to this patient until Monday is this too long? It is due to staffing.  If this is too long will have to have referral processed elsewhere   [facility] requesting call back to confirm if that is okay or if the referral is being canceled [phone]

## 2024-06-12 NOTE — Telephone Encounter (Signed)
 Sent a message to STAT referral team for help as Kristen Barry has not responded at this time.  Is there any advice for the meantime considering ulcers are bleeding at this time?

## 2024-06-13 NOTE — Telephone Encounter (Signed)
 Monday is fine.  Please thank them for their help.

## 2024-06-13 NOTE — Telephone Encounter (Signed)
 Patients granddaughter notes the patches are worsening the situation and pulling off what appears as healthy skin.  Please advise, HH will start care Monday

## 2024-06-13 NOTE — Telephone Encounter (Signed)
 Called and LM on secure VM acknowledging that Monday is acceptable

## 2024-06-20 ENCOUNTER — Encounter: Payer: Self-pay | Admitting: Student in an Organized Health Care Education/Training Program

## 2024-06-20 ENCOUNTER — Ambulatory Visit: Admitting: Student in an Organized Health Care Education/Training Program

## 2024-06-20 VITALS — BP 130/62 | HR 76 | Temp 97.7°F | Ht 63.0 in | Wt 201.0 lb

## 2024-06-20 DIAGNOSIS — L89152 Pressure ulcer of sacral region, stage 2: Secondary | ICD-10-CM | POA: Diagnosis not present

## 2024-06-20 DIAGNOSIS — R5381 Other malaise: Secondary | ICD-10-CM | POA: Diagnosis not present

## 2024-06-20 DIAGNOSIS — F4321 Adjustment disorder with depressed mood: Secondary | ICD-10-CM

## 2024-06-20 MED ORDER — CEPHALEXIN 500 MG PO CAPS: 500.0000 mg | ORAL_CAPSULE | Freq: Two times a day (BID) | ORAL | 0 refills | Status: AC

## 2024-06-20 NOTE — Patient Instructions (Signed)
" °  VISIT SUMMARY: During your visit, we discussed your significant buttock pain, which has worsened recently. We also addressed the redness and inflammation in your buttock, the stage 2 pressure ulcer in your sacral region, and the weakness in your legs. We reviewed your current medications and discussed your recent emotional challenges following the loss of your husband.  YOUR PLAN: -CELLULITIS OF BUTTOCK: Cellulitis is a bacterial skin infection causing redness and inflammation. You have been prescribed Keflex  for one week to treat the infection. It is important to change positions frequently to reduce pressure on the area and to walk as much as possible to improve circulation.  -PRESSURE ULCER OF SACRAL REGION, STAGE 2: A stage 2 pressure ulcer is a sore that has broken through the skin and may cause pain. Your ulcer is improving, and you should continue with the wound care regimen advised by your nurse. Regular repositioning is essential to alleviate pressure and promote healing.  -PHYSICAL DECONDITIONING: Physical deconditioning refers to the weakening of muscles due to immobility. You have experienced weakness in your legs, but your upper body strength is improving. Gradually increase your physical activity, including walking with your walker, to build strength.  INSTRUCTIONS: Please follow up with your nurse for continued wound care and notify us  if your symptoms worsen or if you experience any new issues. Continue taking your prescribed medications and maintain regular physical activity as advised.    Contains text generated by Abridge.   "

## 2024-06-20 NOTE — Progress Notes (Signed)
 "  Acute Office Visit  Patient ID: Kristen Barry, female    DOB: [birth year], 84 y.o.   MRN: [id number]  PCP: Jerrell Cleatus Ned, MD  Chief Complaint  Patient presents with   Follow-up    Patient daughter states she talked to you yesterday, patient has pain in left but cheek. Asking for CT scan. Husband passed away.     Subjective:     HPI  Discussed the use of AI scribe software for clinical note transcription with the patient, who gave verbal consent to proceed.  History of Present Illness Kristen Barry is an 84 year old female with a history of slipped disc who presents with buttock pain.  She experiences significant buttock pain, which intensifies when sitting or standing, described as severe enough to 'about take my breath' when using her lift chair. Initially, the pain improved but has since worsened, and she is unsure of the cause. The pain began before she spent extended periods sitting at her husband's bedside.  A nurse visits to assist with her care and applies a topical treatment to the affected area. She has a known history of a slipped disc in her lower back, which sometimes exacerbates her symptoms. She is currently able to walk with the assistance of a walker but reports weakness in her legs.  She is taking all her prescribed medications and is eating as much as she desires, with assistance from others who bring her food. No allergies to antibiotics.  In terms of her social history, she is grieving the recent loss of her husband of 65 years, which has been a significant emotional challenge. She describes her mood as 'pretty good' and 'tolerable' at present, although she was deeply affected immediately following his passing. Arrangements for the funeral have been made, and she is preparing for the services scheduled for the upcoming weekend.      Objective:    BP 130/62   Pulse 76   Temp 97.7 F (36.5 C) (Oral)   Ht 5' 3 (1.6 m)   Wt 201 lb (91.2 kg)   SpO2  97%   BMI 35.61 kg/m   Physical Exam  Gen: Well-appearing woman, using a rolling walker Heart: Regular, 3 out of 6 early stock murmur at the right upper sternal border Lungs: Unlabored and clear Abd: Soft and nontender Ext: warm, no edema Skin: Left gluteal cheek has an area of errythema and tenderness, mild induration, no drainage or fluctuance.  Right gluteal cheek is a smaller area of induration.      Assessment & Plan:   Problem List Items Addressed This Visit       High   Pressure ulcer of sacral region, stage 2 (HCC) - Primary (Chronic)   Chronic issue.  The ulceration appears improved today.  She still has a lot of redness and looks like associated skin and soft tissue infection on the left side of her gluteal cheek.  No abscess or drainage.  Looks like a nonpurulent skin and soft tissue infection so we will treat this with Keflex  500 mg twice daily for 7 days.  Normal renal function.  No allergies to antibiotics.  Hopefully this will help with her discomfort.  Continue home health nursing for wound care.  I continue to encourage repositioning every 1-2 hours, and increase mobility.      Relevant Medications   cephALEXin  (KEFLEX ) 500 MG capsule   Physical deconditioning (Chronic)   Difficult situation of a pubic ramus fracture after  a fall that has been very difficult to heal, causing severe deconditioning.  Doing a little bit better, she came in with a rolling walker this week instead of a wheelchair which is good to see.  She is walking a little bit more each day.  Has home health services set up now.  Continue with physical therapy.  Going through acute grief after the loss of her husband, at risk for setback.  But has good support from her family.      Grief   This week patient lost her husband of 65 years to pancreatic cancer.  This was a pretty sudden demise, going through acute grief.  Good support from her family.  We talked about expectations.  No role in medication  management at this point.          Meds ordered this encounter  Medications   cephALEXin  (KEFLEX ) 500 MG capsule    Sig: Take 1 capsule (500 mg total) by mouth 2 (two) times daily for 7 days.    Dispense:  14 capsule    Refill:  0    Return if symptoms worsen or fail to improve.  Cleatus Debby Specking, MD [place]  [facility]   "

## 2024-06-20 NOTE — Assessment & Plan Note (Signed)
 This week patient lost her husband of 65 years to pancreatic cancer.  This was a pretty sudden demise, going through acute grief.  Good support from her family.  We talked about expectations.  No role in medication management at this point.

## 2024-06-20 NOTE — Assessment & Plan Note (Signed)
 Difficult situation of a pubic ramus fracture after a fall that has been very difficult to heal, causing severe deconditioning.  Doing a little bit better, she came in with a rolling walker this week instead of a wheelchair which is good to see.  She is walking a little bit more each day.  Has home health services set up now.  Continue with physical therapy.  Going through acute grief after the loss of her husband, at risk for setback.  But has good support from her family.

## 2024-06-20 NOTE — Assessment & Plan Note (Signed)
 Chronic issue.  The ulceration appears improved today.  She still has a lot of redness and looks like associated skin and soft tissue infection on the left side of her gluteal cheek.  No abscess or drainage.  Looks like a nonpurulent skin and soft tissue infection so we will treat this with Keflex 500 mg twice daily for 7 days.  Normal renal function.  No allergies to antibiotics.  Hopefully this will help with her discomfort.  Continue home health nursing for wound care.  I continue to encourage repositioning every 1-2 hours, and increase mobility.

## 2024-06-23 ENCOUNTER — Encounter: Payer: Self-pay | Admitting: Student in an Organized Health Care Education/Training Program

## 2024-06-23 MED ORDER — AMLODIPINE BESYLATE 2.5 MG PO TABS: 2.5000 mg | ORAL_TABLET | Freq: Every day | ORAL | 3 refills | Status: AC

## 2024-06-23 NOTE — Telephone Encounter (Signed)
 Okay to send medication under your name?

## 2024-08-14 ENCOUNTER — Ambulatory Visit: Admitting: Cardiovascular Disease

## 2024-08-20 ENCOUNTER — Ambulatory Visit: Admitting: Cardiovascular Disease
